# Patient Record
Sex: Female | Born: 1935
Health system: Southern US, Community
[De-identification: ages and names within clinical notes are randomized; demographics above are authoritative.]

## PROBLEM LIST (undated history)

## (undated) DIAGNOSIS — M549 Dorsalgia, unspecified: Secondary | ICD-10-CM

## (undated) DIAGNOSIS — Z5189 Encounter for other specified aftercare: Secondary | ICD-10-CM

## (undated) DIAGNOSIS — I359 Nonrheumatic aortic valve disorder, unspecified: Secondary | ICD-10-CM

## (undated) DIAGNOSIS — Z8711 Personal history of peptic ulcer disease: Secondary | ICD-10-CM

## (undated) DIAGNOSIS — Z8719 Personal history of other diseases of the digestive system: Secondary | ICD-10-CM

## (undated) DIAGNOSIS — I10 Essential (primary) hypertension: Secondary | ICD-10-CM

## (undated) DIAGNOSIS — K219 Gastro-esophageal reflux disease without esophagitis: Secondary | ICD-10-CM

## (undated) DIAGNOSIS — C44601 Unspecified malignant neoplasm of skin of unspecified upper limb, including shoulder: Secondary | ICD-10-CM

## (undated) DIAGNOSIS — K5790 Diverticulosis of intestine, part unspecified, without perforation or abscess without bleeding: Secondary | ICD-10-CM

## (undated) DIAGNOSIS — M1990 Unspecified osteoarthritis, unspecified site: Secondary | ICD-10-CM

## (undated) DIAGNOSIS — J189 Pneumonia, unspecified organism: Secondary | ICD-10-CM

## (undated) DIAGNOSIS — I119 Hypertensive heart disease without heart failure: Secondary | ICD-10-CM

## (undated) DIAGNOSIS — Z9889 Other specified postprocedural states: Secondary | ICD-10-CM

## (undated) DIAGNOSIS — M75101 Unspecified rotator cuff tear or rupture of right shoulder, not specified as traumatic: Secondary | ICD-10-CM

## (undated) DIAGNOSIS — E559 Vitamin D deficiency, unspecified: Secondary | ICD-10-CM

## (undated) DIAGNOSIS — G8929 Other chronic pain: Secondary | ICD-10-CM

## (undated) DIAGNOSIS — I2510 Atherosclerotic heart disease of native coronary artery without angina pectoris: Secondary | ICD-10-CM

## (undated) DIAGNOSIS — Z950 Presence of cardiac pacemaker: Secondary | ICD-10-CM

## (undated) DIAGNOSIS — M12811 Other specific arthropathies, not elsewhere classified, right shoulder: Secondary | ICD-10-CM

## (undated) DIAGNOSIS — E785 Hyperlipidemia, unspecified: Secondary | ICD-10-CM

## (undated) DIAGNOSIS — D649 Anemia, unspecified: Secondary | ICD-10-CM

## (undated) DIAGNOSIS — I4891 Unspecified atrial fibrillation: Secondary | ICD-10-CM

## (undated) DIAGNOSIS — IMO0001 Reserved for inherently not codable concepts without codable children: Secondary | ICD-10-CM

## (undated) HISTORY — DX: Vitamin D deficiency, unspecified: E55.9

## (undated) HISTORY — PX: ESOPHAGOGASTRODUODENOSCOPY: SHX1529

## (undated) HISTORY — PX: KNEE ARTHROPLASTY: SHX992

## (undated) HISTORY — DX: Unspecified osteoarthritis, unspecified site: M19.90

## (undated) HISTORY — PX: APPENDECTOMY: SHX54

## (undated) HISTORY — DX: Gastro-esophageal reflux disease without esophagitis: K21.9

## (undated) HISTORY — DX: Hypertensive heart disease without heart failure: I11.9

## (undated) HISTORY — PX: EYE SURGERY: SHX253

## (undated) HISTORY — PX: SHOULDER OPEN ROTATOR CUFF REPAIR: SHX2407

## (undated) HISTORY — PX: CRANIOTOMY: SHX93

## (undated) HISTORY — PX: CHOLECYSTECTOMY: SHX55

## (undated) HISTORY — PX: INSERT / REPLACE / REMOVE PACEMAKER: SUR710

## (undated) HISTORY — PX: COLONOSCOPY: SHX174

## (undated) HISTORY — DX: Diverticulosis of intestine, part unspecified, without perforation or abscess without bleeding: K57.90

## (undated) HISTORY — DX: Hyperlipidemia, unspecified: E78.5

## (undated) HISTORY — PX: BACK SURGERY: SHX140

## (undated) HISTORY — DX: Nonrheumatic aortic valve disorder, unspecified: I35.9

## (undated) HISTORY — PX: ABDOMINAL HYSTERECTOMY: SHX81

## (undated) HISTORY — PX: FRACTURE SURGERY: SHX138

---

## 1998-06-11 ENCOUNTER — Other Ambulatory Visit: Admission: RE | Admit: 1998-06-11 | Discharge: 1998-06-11 | Payer: Self-pay | Admitting: Obstetrics and Gynecology

## 2000-07-13 ENCOUNTER — Emergency Department (HOSPITAL_COMMUNITY): Admission: EM | Admit: 2000-07-13 | Discharge: 2000-07-14 | Payer: Self-pay | Admitting: Emergency Medicine

## 2000-07-16 ENCOUNTER — Ambulatory Visit (HOSPITAL_COMMUNITY): Admission: RE | Admit: 2000-07-16 | Discharge: 2000-07-16 | Payer: Self-pay

## 2000-08-21 ENCOUNTER — Ambulatory Visit (HOSPITAL_BASED_OUTPATIENT_CLINIC_OR_DEPARTMENT_OTHER): Admission: RE | Admit: 2000-08-21 | Discharge: 2000-08-22 | Payer: Self-pay | Admitting: Orthopedic Surgery

## 2007-08-30 ENCOUNTER — Encounter: Admission: RE | Admit: 2007-08-30 | Discharge: 2007-08-30 | Payer: Self-pay | Admitting: Neurosurgery

## 2007-12-02 ENCOUNTER — Inpatient Hospital Stay (HOSPITAL_COMMUNITY): Admission: RE | Admit: 2007-12-02 | Discharge: 2007-12-06 | Payer: Self-pay | Admitting: Neurosurgery

## 2008-03-24 ENCOUNTER — Encounter: Admission: RE | Admit: 2008-03-24 | Discharge: 2008-03-24 | Payer: Self-pay | Admitting: Neurosurgery

## 2008-05-28 ENCOUNTER — Encounter: Admission: RE | Admit: 2008-05-28 | Discharge: 2008-05-28 | Payer: Self-pay | Admitting: Neurosurgery

## 2008-07-09 ENCOUNTER — Encounter: Admission: RE | Admit: 2008-07-09 | Discharge: 2008-07-09 | Payer: Self-pay | Admitting: Neurosurgery

## 2008-11-24 ENCOUNTER — Encounter: Admission: RE | Admit: 2008-11-24 | Discharge: 2008-11-24 | Payer: Self-pay | Admitting: Neurosurgery

## 2008-12-09 ENCOUNTER — Inpatient Hospital Stay (HOSPITAL_COMMUNITY): Admission: RE | Admit: 2008-12-09 | Discharge: 2008-12-12 | Payer: Self-pay | Admitting: Neurosurgery

## 2009-04-13 ENCOUNTER — Encounter: Admission: RE | Admit: 2009-04-13 | Discharge: 2009-04-13 | Payer: Self-pay | Admitting: Neurosurgery

## 2009-09-07 ENCOUNTER — Encounter: Admission: RE | Admit: 2009-09-07 | Discharge: 2009-09-07 | Payer: Self-pay | Admitting: Neurosurgery

## 2009-12-06 ENCOUNTER — Encounter: Admission: RE | Admit: 2009-12-06 | Discharge: 2009-12-06 | Payer: Self-pay | Admitting: Neurosurgery

## 2010-04-10 ENCOUNTER — Encounter: Payer: Self-pay | Admitting: Neurosurgery

## 2010-04-11 ENCOUNTER — Encounter: Payer: Self-pay | Admitting: Neurosurgery

## 2010-04-23 ENCOUNTER — Emergency Department (HOSPITAL_COMMUNITY)
Admission: EM | Admit: 2010-04-23 | Discharge: 2010-04-23 | Disposition: A | Discharge status: Home / Self Care | Payer: Medicare Other | Attending: Emergency Medicine | Admitting: Emergency Medicine

## 2010-04-23 ENCOUNTER — Emergency Department (HOSPITAL_COMMUNITY): Payer: Medicare Other

## 2010-04-23 DIAGNOSIS — M549 Dorsalgia, unspecified: Secondary | ICD-10-CM | POA: Insufficient documentation

## 2010-04-23 DIAGNOSIS — I10 Essential (primary) hypertension: Secondary | ICD-10-CM | POA: Insufficient documentation

## 2010-04-26 ENCOUNTER — Other Ambulatory Visit: Payer: Self-pay | Admitting: Neurosurgery

## 2010-04-26 DIAGNOSIS — M545 Low back pain: Secondary | ICD-10-CM

## 2010-04-27 ENCOUNTER — Emergency Department (HOSPITAL_COMMUNITY): Payer: Medicare Other

## 2010-04-27 ENCOUNTER — Observation Stay (HOSPITAL_COMMUNITY)
Admission: EM | Admit: 2010-04-27 | Discharge: 2010-05-03 | Disposition: A | Discharge status: Home Health | Payer: Medicare Other | Source: Ambulatory Visit | Attending: Internal Medicine | Admitting: Internal Medicine

## 2010-04-27 DIAGNOSIS — M810 Age-related osteoporosis without current pathological fracture: Secondary | ICD-10-CM | POA: Insufficient documentation

## 2010-04-27 DIAGNOSIS — I4891 Unspecified atrial fibrillation: Secondary | ICD-10-CM | POA: Insufficient documentation

## 2010-04-27 DIAGNOSIS — I10 Essential (primary) hypertension: Secondary | ICD-10-CM | POA: Insufficient documentation

## 2010-04-27 DIAGNOSIS — R209 Unspecified disturbances of skin sensation: Secondary | ICD-10-CM | POA: Insufficient documentation

## 2010-04-27 DIAGNOSIS — Z01812 Encounter for preprocedural laboratory examination: Secondary | ICD-10-CM | POA: Insufficient documentation

## 2010-04-27 DIAGNOSIS — A498 Other bacterial infections of unspecified site: Secondary | ICD-10-CM | POA: Insufficient documentation

## 2010-04-27 DIAGNOSIS — M8448XA Pathological fracture, other site, initial encounter for fracture: Principal | ICD-10-CM | POA: Insufficient documentation

## 2010-04-27 DIAGNOSIS — IMO0002 Reserved for concepts with insufficient information to code with codable children: Secondary | ICD-10-CM | POA: Insufficient documentation

## 2010-04-27 DIAGNOSIS — N390 Urinary tract infection, site not specified: Secondary | ICD-10-CM | POA: Insufficient documentation

## 2010-04-27 DIAGNOSIS — M5430 Sciatica, unspecified side: Secondary | ICD-10-CM | POA: Insufficient documentation

## 2010-04-27 LAB — CBC
Hemoglobin: 10.9 g/dL — ABNORMAL LOW (ref 12.0–15.0)
MCH: 31.2 pg (ref 26.0–34.0)
MCHC: 33.6 g/dL (ref 30.0–36.0)
Platelets: 424 10*3/uL — ABNORMAL HIGH (ref 150–400)
RDW: 13.9 % (ref 11.5–15.5)

## 2010-04-27 LAB — DIFFERENTIAL
Basophils Absolute: 0 10*3/uL (ref 0.0–0.1)
Basophils Relative: 0 % (ref 0–1)
Eosinophils Absolute: 0 10*3/uL (ref 0.0–0.7)
Eosinophils Relative: 0 % (ref 0–5)
Monocytes Absolute: 0.4 10*3/uL (ref 0.1–1.0)
Monocytes Relative: 4 % (ref 3–12)

## 2010-04-27 LAB — URINALYSIS, ROUTINE W REFLEX MICROSCOPIC
Bilirubin Urine: NEGATIVE
Ketones, ur: NEGATIVE mg/dL
Specific Gravity, Urine: 1.012 (ref 1.005–1.030)
Urobilinogen, UA: 0.2 mg/dL (ref 0.0–1.0)

## 2010-04-27 LAB — POCT I-STAT, CHEM 8
BUN: 6 mg/dL (ref 6–23)
Calcium, Ion: 1.01 mmol/L — ABNORMAL LOW (ref 1.12–1.32)
Creatinine, Ser: 0.7 mg/dL (ref 0.4–1.2)
Glucose, Bld: 148 mg/dL — ABNORMAL HIGH (ref 70–99)
Hemoglobin: 11.6 g/dL — ABNORMAL LOW (ref 12.0–15.0)
TCO2: 27 mmol/L (ref 0–100)

## 2010-04-27 LAB — URINE MICROSCOPIC-ADD ON

## 2010-04-27 LAB — PROTIME-INR: Prothrombin Time: 13.8 seconds (ref 11.6–15.2)

## 2010-04-27 MED ORDER — GADOBENATE DIMEGLUMINE 529 MG/ML IV SOLN
18.0000 mL | Freq: Once | INTRAVENOUS | Status: AC
  Administered 2010-04-27: 18 mL via INTRAVENOUS

## 2010-04-28 LAB — BASIC METABOLIC PANEL
Calcium: 8.9 mg/dL (ref 8.4–10.5)
Creatinine, Ser: 0.64 mg/dL (ref 0.4–1.2)
GFR calc Af Amer: >60 mL/min (ref >60)
GFR calc non Af Amer: >60 mL/min (ref >60)
Glucose, Bld: 172 mg/dL — ABNORMAL HIGH (ref 70–99)
Sodium: 134 mEq/L — ABNORMAL LOW (ref 135–145)

## 2010-04-28 LAB — CBC
HCT: 35 % — ABNORMAL LOW (ref 36.0–46.0)
MCV: 94.1 fL (ref 78.0–100.0)
RBC: 3.72 MIL/uL — ABNORMAL LOW (ref 3.87–5.11)
RDW: 13.8 % (ref 11.5–15.5)
WBC: 10.7 10*3/uL — ABNORMAL HIGH (ref 4.0–10.5)

## 2010-04-29 ENCOUNTER — Other Ambulatory Visit: Payer: Medicare Other

## 2010-04-29 LAB — URINE CULTURE: Colony Count: >=100000

## 2010-05-01 LAB — BASIC METABOLIC PANEL
CO2: 28 mEq/L (ref 19–32)
Calcium: 8.8 mg/dL (ref 8.4–10.5)
Creatinine, Ser: 0.61 mg/dL (ref 0.4–1.2)
GFR calc Af Amer: >60 mL/min (ref >60)
Sodium: 134 mEq/L — ABNORMAL LOW (ref 135–145)

## 2010-05-01 LAB — CBC
Hemoglobin: 11.3 g/dL — ABNORMAL LOW (ref 12.0–15.0)
MCHC: 33.6 g/dL (ref 30.0–36.0)
Platelets: 538 10*3/uL — ABNORMAL HIGH (ref 150–400)
RBC: 3.68 MIL/uL — ABNORMAL LOW (ref 3.87–5.11)

## 2010-05-02 ENCOUNTER — Observation Stay (HOSPITAL_COMMUNITY): Payer: Medicare Other

## 2010-05-02 LAB — CBC
HCT: 33.9 % — ABNORMAL LOW (ref 36.0–46.0)
Hemoglobin: 11.2 g/dL — ABNORMAL LOW (ref 12.0–15.0)
MCHC: 33 g/dL (ref 30.0–36.0)
RBC: 3.66 MIL/uL — ABNORMAL LOW (ref 3.87–5.11)
WBC: 8.3 10*3/uL (ref 4.0–10.5)

## 2010-05-02 LAB — BASIC METABOLIC PANEL
CO2: 28 mEq/L (ref 19–32)
Chloride: 96 mEq/L (ref 96–112)
Creatinine, Ser: 0.61 mg/dL (ref 0.4–1.2)
GFR calc Af Amer: >60 mL/min (ref >60)
Potassium: 3.7 mEq/L (ref 3.5–5.1)
Sodium: 134 mEq/L — ABNORMAL LOW (ref 135–145)

## 2010-05-02 LAB — DIFFERENTIAL
Basophils Absolute: 0 10*3/uL (ref 0.0–0.1)
Basophils Relative: 0 % (ref 0–1)
Lymphocytes Relative: 21 % (ref 12–46)
Monocytes Absolute: 0.6 10*3/uL (ref 0.1–1.0)
Neutro Abs: 5.9 10*3/uL (ref 1.7–7.7)
Neutrophils Relative %: 71 % (ref 43–77)

## 2010-05-02 LAB — APTT: aPTT: 32 seconds (ref 24–37)

## 2010-05-02 MED ORDER — IOHEXOL 300 MG/ML  SOLN: 50.0000 mL | Freq: Once | INTRAMUSCULAR | Status: AC | PRN

## 2010-05-12 NOTE — Consult Note (Signed)
NAME:  [name], [name] NO.:  [id number]  MEDICAL RECORD NO.:  [id number]           PATIENT TYPE:  I  LOCATION:  5035                         FACILITY:  [facility]  PHYSICIAN:  Donalee Citrin, M.D.        DATE OF BIRTH:  1931-04-02  DATE OF CONSULTATION:  04/27/2010 DATE OF DISCHARGE:                                CONSULTATION   REASON FOR CONSULTATION:  Low back pain and sacral insufficiency fractures.  HISTORY OF PRESENT ILLNESS:  The patient is a 77 year old female with past medical history of longstanding chronic low back pain and hypertension who presented to my office yesterday with an exacerbation of low back pain and lack of response to pain medication.  On evaluation in the office, she was neurologically intact.  I changed her pain medication and ordered an outpatient MRI, but she showed up in the ER today, underwent MRI scan.  MRI scan showed no acute changes in her lumbar spine and no complications from her previous operations, however, did show extensive right-sided sacral insufficiency fractures, as well as apparently her blood work and medical workup here in the ER showed a urinary tract infection.  The patient currently is complaining of pain predominantly across her low back into her tailbone on both sides that makes it difficult.  She has no pain in her legs.  Her bowel and bladder are working fine except for some pain on urination.  Past medical history again is remarkable for hypertension and multiple back surgeries.  On exam, the patient has 5/5 strength in iliopsoas, quads, hamstrings, gastrocs, and EHL.  She has normal symmetric reflexes and sensation.  She has localized pain around her sacrum on both sides, and her MRI scan shows well decompressed spinal canal with no complications of lumbar fusion.  She was fused down from L1-L5.  She has an intact disk space at L5-S1, however, in the sacrum, it shows some increased enhancement on the right side  of her sacrum which is consistent with sacral insufficiency fractures, so the patient most likely will be admitted to Medicine, will need extensive workup metabolically and nutritionally with potential vitamin D and calcium supplements per evaluation.  She may also need workup for osteopenia.  In addition, the patient will need bedrest with limited out of bed activity and use of coccygeal pillow whenever she is out of bed.  The patient will need DVT prophylaxis with Lovenox subcu b.i.d., and after adequate treatment of her UTI, the patient should be able to be discharged either home or to a rehab facility.  Pain management wise, the patient has been on long-term oxycodone and she has developed tolerance to this.  She may actually need some augmentation with fentanyl patch or increasing her oxycodone dose, she was on 10 mg p.o. q.6 h. one to two pills p.r.n., so it is possible she may have to go to 15 with the OxyIR, but I will follow the patient's consult at this hospital and we will continue to work on pain management.          ______________________________ Donalee Citrin, M.D.  GC/MEDQ  D:  04/27/2010  T:  04/28/2010  Job:  [id number]  Electronically Signed by Donalee Citrin M.D. on 05/12/2010 07:06:54 AM

## 2010-05-13 ENCOUNTER — Other Ambulatory Visit (HOSPITAL_COMMUNITY): Payer: Self-pay | Admitting: Interventional Radiology

## 2010-05-13 DIAGNOSIS — IMO0002 Reserved for concepts with insufficient information to code with codable children: Secondary | ICD-10-CM

## 2010-05-18 ENCOUNTER — Ambulatory Visit (HOSPITAL_COMMUNITY)
Admission: RE | Admit: 2010-05-18 | Discharge: 2010-05-18 | Disposition: A | Discharge status: Home / Self Care | Payer: Medicare Other | Source: Ambulatory Visit | Attending: Interventional Radiology | Admitting: Interventional Radiology

## 2010-05-18 ENCOUNTER — Other Ambulatory Visit (HOSPITAL_COMMUNITY): Payer: Self-pay | Admitting: Interventional Radiology

## 2010-05-18 DIAGNOSIS — IMO0002 Reserved for concepts with insufficient information to code with codable children: Secondary | ICD-10-CM

## 2010-05-23 NOTE — H&P (Signed)
NAME:  [name], [name] NO.:  [id number]  MEDICAL RECORD NO.:  [id number]           PATIENT TYPE:  I  LOCATION:  5035                         FACILITY:  [facility]  PHYSICIAN:  Della Goo, M.D. DATE OF BIRTH:  1931-09-12  DATE OF ADMISSION:  04/27/2010 DATE OF DISCHARGE:                             HISTORY & PHYSICAL   CHIEF COMPLAINT:  Severe back pain.  HISTORY OF PRESENT ILLNESS:  This is a 77 year old female who presents to the emergency department secondary to complaints of severe lower back pain radiating into both legs, which has been worsening over the past 3 weeks.  The patient denies any history of trauma.  She denies having any overexertion.  The patient was seen and evaluated in the emergency department on April 23, 2010, and discharged on pain medication therapy and was to follow up with Dr. Donalee Citrin who has performed her previous back surgeries.  She saw Dr. Wynetta Emery 1 day ago.  She presented to the emergency department on April 27, 2010, secondary to continued pain without relief from the prescribed medication.  The patient was seen and evaluated by the EDP.  Dr. Wynetta Emery also saw the patient in the emergency department and the patient was referred for medical admission for pain control..  At the time of evaluation the patient had been medicated for pain.  She stated the pain had been 10/10, sharp and achy, radiating into both legs.  She states that she had difficulty walking over the past 24 hours secondary to pain.  She states that now the pain is 0/10 after being medicated.  But, she is unable to walk and has weakness.  The imaging studies performed today in the emergency department were the MRI of the lumbar spine with and without contrast.  The patient was found to have extensive surgical changes in the lumbar spine which appears stable, degenerative disk disease at T11 through T12 and T12 through L1, and a right sacral stress fracture was  also seen.  The patient was referred for medical admission.  PAST MEDICAL HISTORY:  History of hypertension, history of atrial fibrillation, osteoarthritis, osteoporosis.  Her medications will need to be further verified.  The patient's medications include flecainide 50 mg one p.o. daily, metoprolol 12.5 mg one p.o. b.i.d., Tribenzor 25 once daily, aspirin 81 mg one p.o. daily, Percocet 10/325 p.r.n., oxycodone 15 mg p.o. q.6 h. p.r.n., alprazolam 0.25 mg p.o. p.r.n., Lyrica 75 mg one p.o. t.i.d., and cyclobenzaprine 10 mg p.o. p.r.n.  ALLERGIES:  To AMLODIPINE and PENICILLIN.  SOCIAL HISTORY:  The patient is a nonsmoker, nondrinker.  No history of illicit drug usage.  FAMILY HISTORY:  Noncontributory.  REVIEW OF SYSTEMS:  Pertinent as mentioned above.  The patient denies any urinary incontinence or loss of bowel or bladder function.  PHYSICAL EXAMINATION FINDINGS:  GENERAL:  This is a pleasant 77 year old elderly Caucasian female in no visible discomfort or acute distress currently. VITAL SIGNS:  Temperature 98.7, blood pressure 153/63, heart rate 84, respirations 18, O2 saturations 96-97%. HEENT:  Normocephalic, atraumatic.  Pupils equally round, reactive to light.  Extraocular movements are intact.  Funduscopic  benign.  There is no scleral icterus.  Nares are patent bilaterally.  Oropharynx is clear. NECK:  Supple.  Full range of motion.  No thyromegaly, adenopathy or jugular venous distention. CARDIOVASCULAR:  Regular rate and rhythm.  No murmurs, gallops or rubs. LUNGS:  Clear to auscultation bilaterally.  No rales, rhonchi or wheezes. ABDOMEN:  Positive bowel sounds, soft, nontender, nondistended.  No hepatosplenomegaly.  No rebound.  No guarding. EXTREMITIES:  Without cyanosis, clubbing or edema. NEUROLOGIC:  The patient is alert and oriented x3.  Her cranial nerves are intact.  She is able to move all four of her extremities, however, does have limited mobility of the  lower extremity secondary to pain. Gait was not assessed secondary to her pain.  ASSESSMENT:  [age]-year-old female being admitted with 1. Stress fracture of the sacrum/compression fracture. 2. Intractable back pain with sciatica. 3. Degenerative disk disease. 4. Atrial fibrillation. 5. Urinary tract infection.  PLAN:  The patient will be admitted for pain control therapy.  Her regular medications will be further verified.  The patient will be placed on antibiotic therapy of ciprofloxacin one p.o. b.i.d. for the urinary tract infection.  A urine culture and sensitivity has been requested.  The patient will be placed on DVT prophylaxis.  Further workup will ensue pending results of the patient's clinical course.     Della Goo, M.D.     HJ/MEDQ  D:  04/27/2010  T:  04/27/2010  Job:  [id number]  Electronically Signed by Della Goo M.D. on 05/23/2010 08:13:03 PM

## 2010-05-26 NOTE — Discharge Summary (Addendum)
NAME:  [name], [name]              ACCOUNT NO.:  [id number]  MEDICAL RECORD NO.:  [id number]           PATIENT TYPE:  I  LOCATION:  5035                         FACILITY:  [facility]  PHYSICIAN:  Kela Millin, M.D.DATE OF BIRTH:  06/22/1931  DATE OF ADMISSION:  04/27/2010 DATE OF DISCHARGE:  05/03/2010                        DISCHARGE SUMMARY - REFERRING   DISCHARGE DIAGNOSES: 1. Right sacral stress/insufficiency fractures. 2. E coli urinary tract infection. 3. History of atrial fibrillation. 4. Osteoarthritis. 5. Osteopenia. 6. Osteoporosis. 7. Hypertension.  CONSULTATIONS: 1. Neurosurgery -- Donalee Citrin, M.D. 2. Interventional radiology -- Dr. Corliss Skains.  PROCEDURES AND STUDIES: 1. MRI of lumbar spine on April 27, 2010 -- extensive surgical     changes in the lumbar spine.  This appears stable.  No complicating     features.  Stable degenerative disk disease at T11-T12 and T12-L1.     Right sacral stress/insufficiency fractures.  BRIEF HISTORY:  The patient is a 77 year old white female with above- listed medical problems who presented with complaints of severe low back pain radiating to both legs and had been worsening over a 3-week period. She denied any trauma and also denied any weightlifting/over exertion. The patient had been seen in the ED on February 4th and was and was discharged on pain medications and scheduled to follow up with Dr. Donalee Citrin who had done previous back surgeries.  She saw Dr. Wynetta Emery on the day prior to admission and came back the next day with continued pain without relief from her prescribed medications.  Dr. Wynetta Emery saw the patient in the ED and requested admission to the Medicine Service.  She described her pain at 10/10 in intensity radiating to both legs; and she was having difficulty walking in the 24 hours prior to admission secondary to the pain.  In the ED, she had an MRI done and the results are as stated above.  She was admitted for  further evaluation and management.  HOSPITAL COURSE: 1. Right sacral stress/insufficiency fractures -- upon admission, the     patient had an MRI done and the results are as stated above.  She     was started on IV as well as oral analgesics for pain control.  Dr.     Donalee Citrin saw the patient and followed up and recommended     Interventional Radiology consult for vertebroplasty/sacroplasty.     On the above analgesics, the patient was still complaining of pain.     So, she was started on a fentanyl patch and with that her pain     control improved.  Interventional Radiology did sacroplasty on     May 02, 2010.  The patient had been able to ambulate with a     walker.  Home health PT/OT is to follow the patient upon discharge.     Dr. Wynetta Emery saw the patient today and has recommended that she be     discharged to home and follow up with him in 1-2 weeks. 2. E coli urinary tract infection -- the patient's urinalysis on     admission was consistent with urinary tract infection.  She was  started on a empiric antibiotics.  The urine cultures came back     growing E coli that was resistant to Cipro.  The Cipro was     discontinued and she was placed on Rocephin instead.  She has     remained afebrile and her white cell count today prior to discharge     is 8.3.  She will be discharged on oral antibiotics to complete the     treatment course and is to follow up as an outpatient with her     primary care physician. 3. Atrial fibrillation -- she was maintained on her outpatient     medications as well as aspirin during this hospital stay and is to     continue them upon discharge.  Her rate remained stable during her     hospitalization. 4. Osteoporosis -- the patient was discharged on Os-Cal D with and is     to follow up with her primary care physician. 5. Hypertension -- she was maintained on outpatient medications and is     to continue them upon discharge.  DISCHARGE  MEDICATIONS: 1. Aspirin 81 mg p.o. daily. 2. Ceftin 500 mg 1 p.o. b.i.d. for 3 more days. 3. Colace 100 mg p.o. b.i.d. 4. Fentanyl 25 mcg every 72 hours. 5. Os-Cal plus D 1 tablet p.o. t.i.d. 6. Senokot 2 tablets p.o. daily p.r.n. 7. Flecainide 100 mg 1/2 a tablet in the morning and 1 tablet in the     evening. 8. Flexeril 10 mg 1 p.o. t.i.d. p.r.n. 9. Metoprolol 12.5 mg 1/2 a tablet p.o. b.i.d. 10.Percocet 10/325 mg 1-2 tablets every 4-6 hours p.r.n. 11.Tribenzor 40/10/25 mg 1 tablet p.o. daily.  DISCONTINUED MEDICATION:  Oxycodone 15 mg.  FOLLOWUP CARE: 1. Dr. Donalee Citrin in 1-2 weeks. 2. Primary care physician in 1-2 weeks.  DISCHARGE CONDITION:  Improved/stable.     Kela Millin, M.D.     ACV/MEDQ  D:  05/03/2010  T:  05/03/2010  Job:  [id number]  cc:   Donalee Citrin, M.D.  Electronically Signed by Donnalee Curry M.D. on 05/24/2010 06:09:19 PM Electronically Signed by Donnalee Curry M.D. on 05/24/2010 06:19:52 PM Electronically Signed by Donnalee Curry M.D. on 05/24/2010 07:32:31 PM

## 2010-06-22 ENCOUNTER — Other Ambulatory Visit (HOSPITAL_COMMUNITY): Payer: Self-pay | Admitting: Neurosurgery

## 2010-06-22 DIAGNOSIS — B999 Unspecified infectious disease: Secondary | ICD-10-CM

## 2010-06-24 LAB — BASIC METABOLIC PANEL
BUN: 10 mg/dL (ref 6–23)
BUN: 11 mg/dL (ref 6–23)
CO2: 26 mEq/L (ref 19–32)
CO2: 31 mEq/L (ref 19–32)
Calcium: 8.5 mg/dL (ref 8.4–10.5)
Calcium: 9.1 mg/dL (ref 8.4–10.5)
Chloride: 104 mEq/L (ref 96–112)
Chloride: 104 mEq/L (ref 96–112)
Creatinine, Ser: 0.63 mg/dL (ref 0.4–1.2)
Creatinine, Ser: 0.64 mg/dL (ref 0.4–1.2)
GFR calc Af Amer: >60 mL/min (ref >60)
GFR calc Af Amer: >60 mL/min (ref >60)
GFR calc non Af Amer: >60 mL/min (ref >60)
GFR calc non Af Amer: >60 mL/min (ref >60)
Glucose, Bld: 104 mg/dL — ABNORMAL HIGH (ref 70–99)
Glucose, Bld: 115 mg/dL — ABNORMAL HIGH (ref 70–99)
Potassium: 3.9 mEq/L (ref 3.5–5.1)
Potassium: 4.8 mEq/L (ref 3.5–5.1)
Sodium: 138 mEq/L (ref 135–145)
Sodium: 142 mEq/L (ref 135–145)

## 2010-06-24 LAB — CBC
HCT: 33.8 % — ABNORMAL LOW (ref 36.0–46.0)
Hemoglobin: 11.2 g/dL — ABNORMAL LOW (ref 12.0–15.0)
MCHC: 33.3 g/dL (ref 30.0–36.0)
MCV: 91.2 fL (ref 78.0–100.0)
Platelets: 344 10*3/uL (ref 150–400)
RBC: 3.71 MIL/uL — ABNORMAL LOW (ref 3.87–5.11)
RDW: 18 % — ABNORMAL HIGH (ref 11.5–15.5)
WBC: 6.3 10*3/uL (ref 4.0–10.5)

## 2010-06-24 LAB — TYPE AND SCREEN
ABO/RH(D): A POS
Antibody Screen: NEGATIVE

## 2010-06-28 ENCOUNTER — Ambulatory Visit (HOSPITAL_COMMUNITY)
Admission: RE | Admit: 2010-06-28 | Discharge: 2010-06-28 | Disposition: A | Discharge status: Home / Self Care | Payer: Medicare Other | Source: Ambulatory Visit | Attending: Neurosurgery | Admitting: Neurosurgery

## 2010-06-28 ENCOUNTER — Other Ambulatory Visit (HOSPITAL_COMMUNITY): Payer: Self-pay | Admitting: Neurosurgery

## 2010-06-28 DIAGNOSIS — B999 Unspecified infectious disease: Secondary | ICD-10-CM

## 2010-06-28 DIAGNOSIS — M545 Low back pain, unspecified: Secondary | ICD-10-CM | POA: Insufficient documentation

## 2010-06-28 DIAGNOSIS — M899 Disorder of bone, unspecified: Secondary | ICD-10-CM | POA: Insufficient documentation

## 2010-06-28 DIAGNOSIS — M1990 Unspecified osteoarthritis, unspecified site: Secondary | ICD-10-CM | POA: Insufficient documentation

## 2010-06-28 DIAGNOSIS — I4891 Unspecified atrial fibrillation: Secondary | ICD-10-CM | POA: Insufficient documentation

## 2010-06-28 DIAGNOSIS — G8929 Other chronic pain: Secondary | ICD-10-CM | POA: Insufficient documentation

## 2010-06-28 DIAGNOSIS — I10 Essential (primary) hypertension: Secondary | ICD-10-CM | POA: Insufficient documentation

## 2010-06-28 DIAGNOSIS — M799 Soft tissue disorder, unspecified: Secondary | ICD-10-CM | POA: Insufficient documentation

## 2010-06-28 LAB — POCT I-STAT, CHEM 8
BUN: 13 mg/dL (ref 6–23)
Chloride: 101 mEq/L (ref 96–112)
Creatinine, Ser: 0.8 mg/dL (ref 0.4–1.2)
Sodium: 138 mEq/L (ref 135–145)

## 2010-06-28 LAB — CBC
HCT: 31.9 % — ABNORMAL LOW (ref 36.0–46.0)
MCH: 29.6 pg (ref 26.0–34.0)
MCV: 93.5 fL (ref 78.0–100.0)
RBC: 3.41 MIL/uL — ABNORMAL LOW (ref 3.87–5.11)
WBC: 8.8 10*3/uL (ref 4.0–10.5)

## 2010-06-28 LAB — APTT: aPTT: 33 s (ref 24–37)

## 2010-07-01 LAB — BODY FLUID CULTURE: Culture: NO GROWTH

## 2010-07-05 ENCOUNTER — Ambulatory Visit (HOSPITAL_COMMUNITY)
Admission: RE | Admit: 2010-07-05 | Discharge: 2010-07-05 | Disposition: A | Discharge status: Home / Self Care | Payer: Medicare Other | Source: Ambulatory Visit | Attending: Neurosurgery | Admitting: Neurosurgery

## 2010-07-05 ENCOUNTER — Other Ambulatory Visit (HOSPITAL_COMMUNITY): Payer: Self-pay | Admitting: Neurosurgery

## 2010-07-05 DIAGNOSIS — Z95828 Presence of other vascular implants and grafts: Secondary | ICD-10-CM

## 2010-07-05 DIAGNOSIS — Z452 Encounter for adjustment and management of vascular access device: Secondary | ICD-10-CM | POA: Insufficient documentation

## 2010-07-12 ENCOUNTER — Emergency Department (HOSPITAL_COMMUNITY): Payer: Medicare Other

## 2010-07-12 ENCOUNTER — Inpatient Hospital Stay (HOSPITAL_COMMUNITY)
Admission: EM | Admit: 2010-07-12 | Discharge: 2010-07-27 | DRG: 478 | Disposition: A | Discharge status: Home Health | Payer: Medicare Other | Attending: Neurosurgery | Admitting: Neurosurgery

## 2010-07-12 DIAGNOSIS — Z880 Allergy status to penicillin: Secondary | ICD-10-CM

## 2010-07-12 DIAGNOSIS — M810 Age-related osteoporosis without current pathological fracture: Secondary | ICD-10-CM | POA: Diagnosis present

## 2010-07-12 DIAGNOSIS — Z79899 Other long term (current) drug therapy: Secondary | ICD-10-CM

## 2010-07-12 DIAGNOSIS — M869 Osteomyelitis, unspecified: Secondary | ICD-10-CM | POA: Diagnosis present

## 2010-07-12 DIAGNOSIS — I10 Essential (primary) hypertension: Secondary | ICD-10-CM | POA: Diagnosis present

## 2010-07-12 DIAGNOSIS — IMO0002 Reserved for concepts with insufficient information to code with codable children: Principal | ICD-10-CM | POA: Diagnosis present

## 2010-07-12 DIAGNOSIS — I4891 Unspecified atrial fibrillation: Secondary | ICD-10-CM | POA: Diagnosis present

## 2010-07-12 DIAGNOSIS — Z981 Arthrodesis status: Secondary | ICD-10-CM

## 2010-07-12 DIAGNOSIS — M8448XA Pathological fracture, other site, initial encounter for fracture: Secondary | ICD-10-CM | POA: Diagnosis present

## 2010-07-12 DIAGNOSIS — M519 Unspecified thoracic, thoracolumbar and lumbosacral intervertebral disc disorder: Secondary | ICD-10-CM | POA: Diagnosis present

## 2010-07-12 DIAGNOSIS — Z7982 Long term (current) use of aspirin: Secondary | ICD-10-CM

## 2010-07-12 LAB — BASIC METABOLIC PANEL
Calcium: 8.8 mg/dL (ref 8.4–10.5)
Creatinine, Ser: 0.62 mg/dL (ref 0.4–1.2)
GFR calc Af Amer: >60 mL/min (ref >60)
GFR calc non Af Amer: >60 mL/min (ref >60)

## 2010-07-12 LAB — URINALYSIS, ROUTINE W REFLEX MICROSCOPIC
Ketones, ur: NEGATIVE mg/dL
Protein, ur: NEGATIVE mg/dL
Urobilinogen, UA: 0.2 mg/dL (ref 0.0–1.0)

## 2010-07-12 LAB — CBC
MCH: 29.9 pg (ref 26.0–34.0)
MCHC: 32.8 g/dL (ref 30.0–36.0)
Platelets: 512 10*3/uL — ABNORMAL HIGH (ref 150–400)
RBC: 3.61 MIL/uL — ABNORMAL LOW (ref 3.87–5.11)
RDW: 14.8 % (ref 11.5–15.5)

## 2010-07-12 LAB — DIFFERENTIAL
Basophils Relative: 0 % (ref 0–1)
Eosinophils Absolute: 0.1 10*3/uL (ref 0.0–0.7)
Eosinophils Relative: 1 % (ref 0–5)
Monocytes Absolute: 0.6 10*3/uL (ref 0.1–1.0)
Monocytes Relative: 7 % (ref 3–12)
Neutrophils Relative %: 74 % (ref 43–77)

## 2010-07-13 LAB — SEDIMENTATION RATE: Sed Rate: 91 mm/hr — ABNORMAL HIGH (ref 0–22)

## 2010-07-14 ENCOUNTER — Inpatient Hospital Stay (HOSPITAL_COMMUNITY): Payer: Medicare Other

## 2010-07-14 LAB — URINE CULTURE
Colony Count: NO GROWTH
Culture: NO GROWTH

## 2010-07-15 ENCOUNTER — Inpatient Hospital Stay (HOSPITAL_COMMUNITY): Payer: Medicare Other

## 2010-07-15 ENCOUNTER — Other Ambulatory Visit (HOSPITAL_COMMUNITY): Payer: Medicare Other

## 2010-07-15 DIAGNOSIS — M519 Unspecified thoracic, thoracolumbar and lumbosacral intervertebral disc disorder: Secondary | ICD-10-CM

## 2010-07-15 MED ORDER — GADOBENATE DIMEGLUMINE 529 MG/ML IV SOLN
20.0000 mL | Freq: Once | INTRAVENOUS | Status: AC
  Administered 2010-07-15: 20 mL via INTRAVENOUS

## 2010-07-17 LAB — BASIC METABOLIC PANEL
Calcium: 8.9 mg/dL (ref 8.4–10.5)
Creatinine, Ser: 0.73 mg/dL (ref 0.4–1.2)
GFR calc Af Amer: >60 mL/min (ref >60)
GFR calc non Af Amer: >60 mL/min (ref >60)
Glucose, Bld: 106 mg/dL — ABNORMAL HIGH (ref 70–99)
Sodium: 135 mEq/L (ref 135–145)

## 2010-07-18 NOTE — Consult Note (Signed)
NAME:  [name], [name] NO.:  [id number]  MEDICAL RECORD NO.:  [id number]           PATIENT TYPE:  I  LOCATION:  3009                         FACILITY:  [facility]  PHYSICIAN:  Acey Lav, MD  DATE OF BIRTH:  [birth year]  DATE OF CONSULTATION:  07/15/2010 DATE OF DISCHARGE:                                CONSULTATION   REQUESTING PHYSICIAN:  Donalee Citrin, MD  REASON FOR INFECTIOUS DISEASE CONSULTATION:  This patient with T11-T12, T12-L1 diskitis.  HISTORY OF PRESENT ILLNESS:  [name] is a 77 year old lady with past medical history significant for hypertension, osteoarthritis, sclerosis, who underwent prior lumbar laminectomies and fusions from L2-3, L3-4, L4- 5 in 2009, and then underwent re-exploration of the lumbar fusion in 2010 September with posterolateral fusion of L1-L5.  She continued to have back pain and underwent vertebroplasty of her fractures at her S1 site on May 02, 2010.  The patient then developed worsening back pain and underwent a CT scan as an outpatient, which showed increased signal at T11-12, T12-L1 disk spaces.  She underwent disk space aspirate via CT guidance on April 17.  This aspirate failed to grow any organisms.  Consideration was given to re-aspirate the disk space, but this was not done and so the patient was placed on empiric antibiotics with Rocephin and vancomycin.  On this therapy, the patient has continued to have severe low back pain and now CT scan done on the 26th shows evidence of extensive bony destructive changes involving T11-12 and L1 consistent with diskitis in these spaces as well as osteomyelitis involving T11, T12, and T1 with loosening of the screws in the L1 vertebral bodies and paraspinal soft tissue density with retropulsion of the bone in the inferior aspect of T12.  The patient was readmitted to Dr. Lonie Peak service and placed on vancomycin and gentamicin.  She denies having any fevers.  She is about  to have MRI to better delineate the nature of her diskitis and osteomyelitis.  We were consulted to assist in management of this patient with diskitis and vertebral osteomyelitis in the setting of hardware and also recent vertebroplasty.  PAST MEDICAL HISTORY: 1. Osteoporosis, osteoarthritis. 2. Hypertension. 3. Atrial fibrillation.  ALLERGIES:  The patient is allergic to PENICILLIN which caused hives, although she tolerated cephalosporins without difficulty; NORVASC and BENAZEPRIL had caused tongue swelling.  CURRENT MEDICATIONS: 1. Amlodipine. 2. Aspirin. 3. Vitamin D. 4. Flexeril. 5. Voltaren. 6. Flecainide. 7. Gentamicin. 8. Hydrochlorothiazide. 9. Benicar. 10.MiraLax. 11.Vancomycin. 12.Alprazolam.  REVIEW OF SYSTEMS:  As described in history present illness.  Also, pertinent for lower extremity spasms.  Otherwise, 12-point review of systems is negative.  PHYSICAL EXAMINATION:  VITAL SIGNS:  Temperature maximum in the last 24 hours is 99, temperature current 98.2, blood pressure 149/77, pulse 64, respirations 16, pulse ox 93% on room air.GENERAL:  Quite pleasant lady, in no acute stress. HEENT:  Normocephalic, atraumatic.  Pupils equal, round, reactive to light.  Sclerae icteric.  Oropharynx is clear. NECK:  Supple. CARDIOVASCULAR:  Regular rate and rhythm.  No murmurs, gallops, or rubs heard. LUNGS:  Clear to auscultation bilaterally. ABDOMEN:  Soft,  nondistended, nontender. EXTREMITIES:  Right knee prosthetic knee with clean, dry, and intact incision. NEUROLOGIC:  She is able to move her hips and knees, can flex and extend fully.  Otherwise, nonfocal neurologic exam.  LABORATORY DATA:  CT scan of the spine showed extensive surgical changes involving T11-12, L1, findings consistent with diskitis T11-12, T12-L1, osteomyelitis at T11-12 and L1, screws loose at the L1 vertebral bodies, and paraspinal soft tissue density with mild retropulsion of bone in inferior  aspect of T12.  C-reactive protein was 2.  Sedimentation rate was 91.  Urinalysis, although the patient does have symptoms, was negative.  CBC/differential:  White count 8, hemoglobin 10.8, platelets 512, ANC of 5.9.  Metabolic panel:  Sodium 133, potassium 3.9, chloride 101, bicarb 24, BUN and creatinine 11 and 1.60, glucose 124.  Microbiological data:  Urine culture on 24th, no growth.  Body fluid culture June 28, 2010, no growth.  Urine culture April 27, 2010, E- coli that was resistant to levofloxacin, but otherwise sensitive.  IMPRESSION AND RECOMMENDATIONS:  A 77 year old lady with progressive diskitis, osteomyelitis involving the thoracic and lumbar spine.  Vertebral osteomyelitis and diskitis:  I reviewed again the MRI to further delineate what is going on in her spine.  My preference would be if we could take her back off antibiotics and re-aspirate sites after several days her being off antibiotics to give [place] a better chance of isolating the organism.  I would then prepare for checking bacterial cultures, fungal cultures, and AFB cultures from this site.  If we fail to grow anything, I will broaden her out and put her on vancomycin andcefepime.  For the time being while we are getting more further data, I am okay with her staying on vancomycin.  I would like to stop her gentamicin as I am concerned about possibility of nephrotoxicity in this elderly lady with the gentamicin, but also with the vancomycin, though I would like to keep the latter on board.  Thank you for Infectious Disease consultation.  We will continue to follow along.     Acey Lav, MD     CV/MEDQ  D:  07/15/2010  T:  07/16/2010  Job:  [id number]  Electronically Signed by Paulette Blanch DAM MD on 07/18/2010 09:08:27 PM

## 2010-07-19 DIAGNOSIS — M519 Unspecified thoracic, thoracolumbar and lumbosacral intervertebral disc disorder: Secondary | ICD-10-CM

## 2010-07-20 ENCOUNTER — Inpatient Hospital Stay (HOSPITAL_COMMUNITY): Payer: Medicare Other

## 2010-07-20 LAB — PROTIME-INR: Prothrombin Time: 13.4 seconds (ref 11.6–15.2)

## 2010-07-20 LAB — CBC
MCH: 29.6 pg (ref 26.0–34.0)
MCV: 91.7 fL (ref 78.0–100.0)
Platelets: 408 10*3/uL — ABNORMAL HIGH (ref 150–400)
RBC: 3.51 MIL/uL — ABNORMAL LOW (ref 3.87–5.11)
RDW: 15.1 % (ref 11.5–15.5)
WBC: 5.9 10*3/uL (ref 4.0–10.5)

## 2010-07-20 LAB — C-REACTIVE PROTEIN: CRP: 1.3 mg/dL — ABNORMAL HIGH (ref <0.6)

## 2010-07-21 NOTE — H&P (Signed)
  NAME:  [name], [name] NO.:  [id number]  MEDICAL RECORD NO.:  [id number]           PATIENT TYPE:  I  LOCATION:  3009                         FACILITY:  [facility]  PHYSICIAN:  Donalee Citrin, M.D.        DATE OF BIRTH:  11-10-1931  DATE OF ADMISSION:  07/12/2010 DATE OF DISCHARGE:                             HISTORY & PHYSICAL   REASON FOR ADMISSION:  T11-12 and T12-L1 diskitis and pain control.  HISTORY OF PRESENT ILLNESS:  The patient is very pleasant 77 year old who has had a longstanding history with her back, underwent previous L1- L5 fusion, did very well.  However, over the last several weeks to months, had progressive worsening back pain.  Workup as an outpatient has revealed increased signal on T11-12 and T12-L1 disk spaces.  Disk space aspirate per CT-guided biopsy revealed a high white cell count, but no bacteria.  The patient also had a sed rate that was 115 as an outpatient, so the patient was presumptively placed on IV antibiotics, treated as a diskitis, however, no bacteria was cultured.  This was discussed with Dr. Ninetta Lights who recommended potentially repeating  the CT- guided biopsy; however, the patient just had it couple days prior and due to her age and somewhat debilitated condition, I decided to empirically treat her with antibiotics, fall her sed rate and see if we get exact improvement.  The patient presented to the ER last night with worsening pain control and back spasms that were radiating down her right leg into her shin and calf, and so we were reconsulted.  PAST MEDICAL HISTORY:  Remarkable for hypertension.  SURGICAL HISTORY:  Back surgery.  She is a nonsmoker.  She has allergies to PENICILLIN and AMLODIPINE.  She is on aspirin, flecainide, Flexeril, Lopressor, Lyrica, Percocet, Xanax, and aspirin.  PHYSICAL EXAMINATION:  The patient is very pleasant 77 year old female resting comfortably in bed.  Her lower extremity strength is 5/5  in her iliopsoas, quads, hamstrings, gastrocs, and EHL.  She does have some pain limited weakness with proximal right lower extremity secondary to spasms.  Remainder of her physical exam was unremarkable per ER records.  ASSESSMENT:  A 77 year old with intractable pain from T11-12 and T12-L1 diskitis.  We will admit the patient.  We will follow up C-reactive protein and sedimentation rate. We will place the patient on IV vancomycin as well as add a second agent, gentamicin, and we will consult ID and based on the results of her sedimentation rate and C-reactive protein, may elect a repeat imaging with the CT or MRI.  We will also get Physical Therapy to work with the patient.          ______________________________ Donalee Citrin, M.D.     GC/MEDQ  D:  07/13/2010  T:  07/13/2010  Job:  [id number]  Electronically Signed by Donalee Citrin M.D. on 07/21/2010 11:27:28 AM

## 2010-07-23 ENCOUNTER — Inpatient Hospital Stay (HOSPITAL_COMMUNITY): Payer: Medicare Other

## 2010-07-23 LAB — BODY FLUID CULTURE

## 2010-07-25 LAB — BASIC METABOLIC PANEL
BUN: 15 mg/dL (ref 6–23)
CO2: 31 mEq/L (ref 19–32)
Chloride: 101 mEq/L (ref 96–112)
Creatinine, Ser: 0.61 mg/dL (ref 0.4–1.2)
Glucose, Bld: 105 mg/dL — ABNORMAL HIGH (ref 70–99)

## 2010-07-25 LAB — ANAEROBIC CULTURE: Gram Stain: NONE SEEN

## 2010-07-26 DIAGNOSIS — M519 Unspecified thoracic, thoracolumbar and lumbosacral intervertebral disc disorder: Secondary | ICD-10-CM

## 2010-08-02 NOTE — Op Note (Signed)
NAMEMarland Kitchen  [name], [name] NO.:  [id number]   MEDICAL RECORD NO.:  [id number]          PATIENT TYPE:  INP   LOCATION:  3102                         FACILITY:  [facility]   PHYSICIAN:  Donalee Citrin, M.D.        DATE OF BIRTH:  [birth year]   DATE OF PROCEDURE:  12/02/2007  DATE OF DISCHARGE:                               OPERATIVE REPORT   PREOPERATIVE DIAGNOSES:  1. Lumbar spinal stenosis L1-2, 2-3, 3-4, and 4-5.  2. Degenerative disk disease L1 through L5.  3. Grade 1 spondylolisthesis L4-5.   PROCEDURES:  Decompressive lumbar laminectomies and excess will be  needed with standard interbody fusion L1-2, 2-3, 3-4, and 4-5; posterior  lumbar interbody fusion L2-3, 3-4, and 4-5 using hybrid Telamon PEEK  cage packed with locally harvested autograft on one side and the tangent  allograft wedge on the other; pedicle screw fixation L1 through L5 using  the 6.35 Legacy pedicle screw system; posterolateral arthrodesis L1  through L5 using local harvested autograft mixed with Actifuse bone  substitute and BMP posterolaterally; placement of 2 medium Hemovac  drains.   SURGEON:  Donalee Citrin, MD.   ASSISTANT:  Kathaleen Maser. Pool, MD   ANESTHESIA:  General endotracheal.   HISTORY OF PRESENT ILLNESS:  The patient is a very pleasant 77 year old  female who has had progressive worsening back and bilateral leg pain  with neurogenic claudication after a very short distances.  The patient  failed all forms of conservative treatment.  MRI scan showed severe  degenerative disk disease at L1-2, 2-3, 3-4, and 4-5 which had very  degenerative collapse and grade 1 spondylolisthesis at L4-5 marked facet  arthropathy and biforaminal stenosis of the 2, 3, 4, and 5 nerve roots.  The patient failed all forms of conservative treatment and I ultimately  recommended decompression and stabilization of procedure with  symptomatology and predominant back pain greater than leg pain with  neurogenic  claudication.  Risks and benefits of the operation were  explained to the patient.  She understood and agreed to proceed forward.   The patient was brought to the OR and was induced with general  anesthesia.  The patient was placed in prone on Wilson frame.  Back was  prepped and draped in the usual sterile fashion.  No preoperative x-ray  was taken.  A midline incision was placed after administration of 10 mL  of lidocaine with epi extending from what believed to be the L1 spinous  process down to the sacrum.  Then, subperiosteal dissection was carried  out on the lamina of L1, 2, 3, 4, and 5.  Intraoperative x-ray confirmed  localization and identification of the L3 TP pedicle.  So, after  complete exposure had been obtained bilaterally from L1 down through L5  spinous processes at L1, 2, 3, and 4 were all removed.  The patient was  doing remarkably lordosed with severe overriding of the lamina and facet  complexes at 3-4 and 4-5.  Medial facet complexes were all drilled down  at 1-2, 2-3, 3-4, and 4-5 bilaterally.  Then, central decompression was  begun, first working at 2-3.  The dura was identified marching centrally  up removing the entirety of the L2 and the L1 lamina.  Then, complete  medial facetectomies were performed at 1-2 and 2-3.  Marked facet  arthropathy was causing severe displacement at both 2 and 3 root at  these levels as well as the L1 neural foramen was also markedly  stenotic.  This was all aggressively unroofed, and the L1, 2, and 3  nerves were skeletonized, flushed with the pedicle.  Then, working down  at 3-4 and 4-5 with marked hyperlordosis and overriding on the facet  complex and marked facet arthropathy, these were all drilled down using  a 2-mm Kerrison punch.  Complete central decompression and complete  medial facetectomies were performed at 3-4 and 4-5.  Extensive facet  arthropathy and ligamentous hypertrophy was noted at these levels, which  was all  teased away with a #4 Penfield, removed, and completely  decompressed the 4 and the 5 roots bilaterally at this level.  At the  end of decompression, there was no further stenosis at any of the above  4 levels at 1-2, 2-3, 3-4, and 4-5.  Attention was then taken to pedicle  screw placement.  The pedicles were noted to be very small at all  levels, so a 4.5 taps were used and 5.5 screws, first working inferiorly  on the left side at L5.  The bony landmarks from outside of the canal  were used to identify the TP and a complex view directly visualizing the  inside of the canal.  A pilot hole was drilled.  Fluoroscopy confirmed a  good trajectory and position.  Pilot hole was drilled and cannula with  the awl probed, tapped with a 4.5 tap, probed again, and a 5.5 x 45  screw was inserted at L5 on the left.  Then working at L4 in a similar  fashion, a 5.5 x 40 screw was inserted with a 5.5 x 40 screw at L3, L2,  and L1.  There was noted to be break out at L3 on the left laterally to  this and then repositioned medially.  Working and directly inspecting  the medial border canal, there was no medial breach of any of the  screws.  On the right side at L1 and L5 in a similar fashion, all 5  screws were inserted with 5.5 x 40s at L1, 2, 3, and 4, 5.5 x 45 at L5.  After all 10 screws were placed, attention was taken to the interbody  work, first working on the left-sided L4-5 and L3-4.  Annulotomies were  made with an #11 blade scalpel.  This disk was cleaned out and a size 10  distractor was inserted at 4-5 and a size 11 at 3-4.  Then working on  the right side with a D'Errico reflecting the right L5 nerve root  medially, annulotomies were made. Disk space was cleaned out with a size  10 cutter and chisel were used to prepare the endplates.  Fluoroscopy  confirmed at each level on the way appropriate sizing of the grafts and  the cutter and chisel.  After the endplates had been prepared, the disk   spaces were cleaned out.  A right-sided Telamon cage was inserted,  packed with local autograft, mixed with Actifuse.  Then, the left side  distractor was removed.  Then at L3-4 on the right in a similar fashion,  disk space was prepped with a size 10  cutter and chisel and a tangent  was inserted on the right at L3-4.  Then working on the left in a  similar fashion, the disk spaces were cleaned out with a size 10 cutter  and chisel.  Local autograft was packed against the cage and graft  respectively, and then on the left side, a tangent was inserted at L4-5  and a Telamon at L3-4.  After these 2 levels, attention was then taken  to 2-3.  Again in a similar fashion, the L3 nerve root was reflected  medially and disk space was cleaned out.  There was noted to be large  central herniations both at 2-3 and 3-4, at 4-5 has predominantly facet  arthropathy causing stenosis there.  Working at 2-3 disk space, the  central disk herniation was cleaned out.  A size 8 distractor had a good  apposition of the endplates and due to the narrowness of the canal at 2-  3, this was felt to be appropriate sizing for graft material as well as.  So, this was cleaned out with a size 8 cutter and chisel.  A Telamon was  inserted on the right.  Local autograft packed, second Telamon and  tangent inserted on the left.  After all the interbody work had been  done and fluoroscopy confirmed good position of the grafts, the wound  was then copiously irrigated and meticulous hemostasis was maintained.  Aggressive decortication was carried out in TPs and lateral gutters.  Actifuse was packed and lateral gutters from L1 down to L5.  BMP-soaked  sponges were placed over the top of the Actifuse and then more Actifuse  was placed on top of the BMP creating a sandwich of BMP in the middle.  All the graft material and Actifuse was packed posterolaterally.  Then,  two 100-mm rods were fashioned and bent and inserted in top  fashion down  at L5.  The L4 screws compressed against the L5 gently, then the L3 was  compressed against L4, the L2 compressed against the L3, and the L1  compressed against the L2.  All nuts were torqued down and anchored to  place.  The rod was visualized at both ends and noted to be appropriate  size.  Then, the neural foramina were then reinspected.  Gelfoam was  laid on top of the dura, 0.420 crosslink was inserted between the 2 and  the 3 screws.  Then, 2 medium Hemovac drains were placed.  Postop  fluoroscopy confirmed good position of the screws, rods, and bone  grafts.  Then, the wound was closed in layers with interrupted Vicryl and with  running 4-0 subcuticular in the skin.  Benzoin and Steri-Strips applied.  The patient went to the recovery room in stable condition.  At the end  of the case, sponge counts were correct.           ______________________________  Donalee Citrin, M.D.     GC/MEDQ  D:  12/02/2007  T:  12/03/2007  Job:  [id number]

## 2010-08-04 ENCOUNTER — Other Ambulatory Visit: Payer: Self-pay | Admitting: Neurosurgery

## 2010-08-04 ENCOUNTER — Ambulatory Visit
Admission: RE | Admit: 2010-08-04 | Discharge: 2010-08-04 | Disposition: A | Discharge status: Home / Self Care | Payer: Medicare Other | Source: Ambulatory Visit | Attending: Neurosurgery | Admitting: Neurosurgery

## 2010-08-04 DIAGNOSIS — M25551 Pain in right hip: Secondary | ICD-10-CM

## 2010-08-04 DIAGNOSIS — M549 Dorsalgia, unspecified: Secondary | ICD-10-CM

## 2010-08-05 NOTE — Discharge Summary (Signed)
NAMEMarland Kitchen  TEQUIA, WOLMAN NO.:  [id number]   MEDICAL RECORD NO.:  [id number]          PATIENT TYPE:  INP   LOCATION:  3006                         FACILITY:  [facility]   PHYSICIAN:  Donalee Citrin, M.D.        DATE OF BIRTH:  [birth year]   DATE OF ADMISSION:  12/02/2007  DATE OF DISCHARGE:  12/06/2007                               DISCHARGE SUMMARY   ADMITTING DIAGNOSIS:  Lumbar spinal stenosis from L1 down to L5.   PROCEDURE:  Decompressive lumbar laminectomy L1 through L5 with  posterior lumbar interbody fusion L2-3, L3-4, and L4-5.   DISCHARGE DIAGNOSIS:  Lumbar spinal stenosis from L1 down to L5.   HOSPITAL COURSE:  The patient is a very pleasant 77 year old female who  was admitted VMA and went to the operating room in order to perform the  above mentioned procedure.  Postop, the patient did very well.  On the  floor, the patient was afebrile, tolerating a regular diet with  significant improvement of her leg pain.  The patient was mobilized  progressively with physical and occupational therapy and did very well.  Pain came under better control with oral analgesics.  The Foley was able  to be taken out in day #3 and her Hemovac was taken out on day #3.  Physical and Occupational Therapy was able to clear her for discharge.  The patient was is able to be discharged on hospital day #4.  At the  time of discharge, the was neurologically intact, tolerating pain well  on pain medication.           ______________________________  Donalee Citrin, M.D.     GC/MEDQ  D:  01/22/2008  T:  01/23/2008  Job:  [id number]

## 2010-08-05 NOTE — Op Note (Signed)
[place]  Patient:    [name], SWOFFORD                       MRN: [id number] Proc. Date: 08/21/00 Adm. Date:  [id number] Attending:  Twana First                           Operative Report  PREOPERATIVE DIAGNOSES: 1. Right shoulder rotator cuff tear. 2. Right shoulder glenoid labrum tear. 3. Right shoulder impingement. 4. Right shoulder acromioclavicular joint degenerative joint disease.  POSTOPERATIVE DIAGNOSES: 1. Right shoulder rotator cuff tear. 2. Right shoulder glenoid labrum tear. 3. Right shoulder impingement. 4. Right shoulder acromioclavicular joint degenerative joint disease.  PROCEDURE: 1. Right shoulder examination under anesthesia, followed by arthroscopic    partial labrum tear and debridement. 2. Right shoulder open rotator cuff repair. 3. Right shoulder subacromial decompression. 4. Right shoulder distal clavicle excision.  SURGEON:  Elana Alm. Thurston Hole, M.D.  ASSISTANT:  Julien Girt, P.A.  ANESTHESIA:  General.  OPERATIVE TIME:  One hour.  COMPLICATIONS:  None.  INDICATIONS:  [name] is a 77 year old woman who has had significant right shoulder pain for the last three months, increasing in nature, with signs and symptoms and MRI documenting a rotator cuff tear, impingement, and an AC joint arthrosis, who has failed conservative care and is now to undergo an arthroscopy and a rotator cuff repair.  DESCRIPTION OF PROCEDURE:  [name] is brought to the operating room on August 21, 2000, and placed on the operating room table in the supine position after a supraclavicular block had been placed in the holding room.  She is placed on the operating room table in the supine position.  After an adequate level of general anesthesia was obtained, her right shoulder was examined under anesthesia.  She had a full range of motion, and her shoulder was stable to ligamentous examination.  She was then placed in the  beach chair position and her shoulder and arm were prepped using sterile Betadine and draped using a sterile technique.  She received Ancef 1 g IV preoperatively for prophylaxis.  Initially through the posterior arthroscopic portal the arthroscope with a pump attached was placed, and through an anterior portal an arthroscopic probe was placed.  On initial inspection the articular cartilage and glenohumeral joint showed mild grade 1-2 chondromalacia.  The anterior and posterior labrum showed moderate tearing 25%, which was debrided.  The superior labrum and biceps tendon anchor was intact.  The biceps tendon was intact.  The inferior labrum and anterior inferior glenohumeral ligament complex was intact.  The rotator cuff showed a complete tear of the supraspinatus and a partial tear of the infraspinatus.  This was debrided arthroscopically.  A lateral arthroscopic portal was made.  The subacromial space was entered.  The subacromial decompression was carried out, removing 6.0 to 8.0 mm of the undersurface of the anterior, anterolateral, and anteromedial acromion.  The [facility] joint was exposed.  Significant degenerative changes were noted in this joint, and then the distal 5.0 to 6.0 mm of the clavicle was resected under arthroscopic visualization with the 6.0 mm bur. After this was done, then through the lateral portal a deltoid-splitting approach was made 3.0 cm.  The subacromial space was entered.  The rotator cuff tear was thoroughly sharply debrided back to more healthy-appearing tissue.  Then an Arthrax suture anchor was placed in the greater tuberosity, and each of  the suture limbs were brought through the tear in the rotator cuff and tied down, thus securing the cuff back down to the greater tuberosity. After this was done, then the shoulder could be brought through a full range of motion with no impingement upon the repair.  The wound was irrigated and the subacromial space had a pain  catheter placed for postoperative pain control.  The deltoid fascia was then closed with #2-0 Vicryl.  The subcutaneous tissues were closed with #2-0 Vicryl, and the subcuticular layer closed with #3-0 Prolene.  Steri-Strips were applied.  Sterile dressings and a sling applied.  The patient was awakened and returned to the recovery room in stable condition.  FOLLOW-UP CARE:  Ms. Capasso will be followed overnight in the recovery care center for IV pain control and neurovascular monitoring.  She will be discharged tomorrow on Percocet for pain, and Naprosyn.  She will have her pain catheter removed by her husband in two days per our instruction.  She will begin early physical therapy for range of motion, followed by strengthening in six weeks.DD:  08/21/00 TD:  08/21/00 Job: [id number]

## 2010-08-09 ENCOUNTER — Inpatient Hospital Stay (HOSPITAL_COMMUNITY): Payer: Medicare Other

## 2010-08-09 ENCOUNTER — Inpatient Hospital Stay (HOSPITAL_COMMUNITY)
Admission: AD | Admit: 2010-08-09 | Discharge: 2010-08-23 | DRG: 456 | Disposition: A | Discharge status: Home Health | Payer: Medicare Other | Source: Ambulatory Visit | Attending: Neurosurgery | Admitting: Neurosurgery

## 2010-08-09 DIAGNOSIS — I4891 Unspecified atrial fibrillation: Secondary | ICD-10-CM | POA: Diagnosis present

## 2010-08-09 DIAGNOSIS — Z472 Encounter for removal of internal fixation device: Secondary | ICD-10-CM

## 2010-08-09 DIAGNOSIS — M519 Unspecified thoracic, thoracolumbar and lumbosacral intervertebral disc disorder: Principal | ICD-10-CM | POA: Diagnosis present

## 2010-08-09 DIAGNOSIS — I10 Essential (primary) hypertension: Secondary | ICD-10-CM | POA: Diagnosis present

## 2010-08-09 DIAGNOSIS — Z981 Arthrodesis status: Secondary | ICD-10-CM

## 2010-08-09 DIAGNOSIS — M869 Osteomyelitis, unspecified: Secondary | ICD-10-CM | POA: Diagnosis present

## 2010-08-09 DIAGNOSIS — G061 Intraspinal abscess and granuloma: Secondary | ICD-10-CM | POA: Diagnosis present

## 2010-08-10 ENCOUNTER — Inpatient Hospital Stay (HOSPITAL_COMMUNITY): Payer: Medicare Other

## 2010-08-10 MED ORDER — GADOBENATE DIMEGLUMINE 529 MG/ML IV SOLN
14.0000 mL | Freq: Once | INTRAVENOUS | Status: AC | PRN
  Administered 2010-08-10: 14 mL via INTRAVENOUS

## 2010-08-12 LAB — VANCOMYCIN, TROUGH: Vancomycin Tr: 17.2 ug/mL (ref 10.0–20.0)

## 2010-08-12 LAB — CREATININE, SERUM
Creatinine, Ser: 0.57 mg/dL (ref 0.4–1.2)
GFR calc non Af Amer: >60 mL/min (ref >60)

## 2010-08-15 LAB — BASIC METABOLIC PANEL
BUN: 18 mg/dL (ref 6–23)
Chloride: 98 mEq/L (ref 96–112)
GFR calc Af Amer: >60 mL/min (ref >60)
GFR calc non Af Amer: >60 mL/min (ref >60)
Potassium: 3.8 mEq/L (ref 3.5–5.1)

## 2010-08-15 LAB — CBC
Platelets: 338 10*3/uL (ref 150–400)
RBC: 3.95 MIL/uL (ref 3.87–5.11)
WBC: 6.4 10*3/uL (ref 4.0–10.5)

## 2010-08-16 ENCOUNTER — Inpatient Hospital Stay (HOSPITAL_COMMUNITY): Payer: Medicare Other

## 2010-08-16 LAB — BASIC METABOLIC PANEL
Chloride: 94 mEq/L — ABNORMAL LOW (ref 96–112)
GFR calc Af Amer: >60 mL/min (ref >60)
Potassium: 3.6 mEq/L (ref 3.5–5.1)
Sodium: 133 mEq/L — ABNORMAL LOW (ref 135–145)

## 2010-08-16 LAB — SURGICAL PCR SCREEN
MRSA, PCR: NEGATIVE
Staphylococcus aureus: NEGATIVE

## 2010-08-16 LAB — C-REACTIVE PROTEIN: CRP: 0.1 mg/dL — ABNORMAL LOW (ref <0.6)

## 2010-08-16 LAB — CBC
Hemoglobin: 11.6 g/dL — ABNORMAL LOW (ref 12.0–15.0)
MCH: 29.5 pg (ref 26.0–34.0)
MCHC: 33 g/dL (ref 30.0–36.0)
MCV: 89.3 fL (ref 78.0–100.0)
RBC: 3.93 MIL/uL (ref 3.87–5.11)

## 2010-08-16 LAB — SEDIMENTATION RATE: Sed Rate: 17 mm/hr (ref 0–22)

## 2010-08-17 ENCOUNTER — Inpatient Hospital Stay (HOSPITAL_COMMUNITY): Payer: Medicare Other

## 2010-08-18 LAB — CBC
Hemoglobin: 9.1 g/dL — ABNORMAL LOW (ref 12.0–15.0)
Hemoglobin: 8.8 g/dL — ABNORMAL LOW (ref 12.0–15.0)
MCHC: 33.7 g/dL (ref 30.0–36.0)
RBC: 3 MIL/uL — ABNORMAL LOW (ref 3.87–5.11)
RBC: 2.85 MIL/uL — ABNORMAL LOW (ref 3.87–5.11)
WBC: 7.1 10*3/uL (ref 4.0–10.5)

## 2010-08-18 LAB — BASIC METABOLIC PANEL
CO2: 27 mEq/L (ref 19–32)
Calcium: 7.1 mg/dL — ABNORMAL LOW (ref 8.4–10.5)
GFR calc Af Amer: >60 mL/min (ref >60)
Potassium: 3.9 mEq/L (ref 3.5–5.1)
Sodium: 132 mEq/L — ABNORMAL LOW (ref 135–145)

## 2010-08-18 LAB — OSMOLALITY, URINE: Osmolality, Ur: 620 mOsm/kg (ref 390–1090)

## 2010-08-18 LAB — FUNGUS CULTURE W SMEAR: Fungal Smear: NONE SEEN

## 2010-08-19 LAB — CROSSMATCH
ABO/RH(D): A POS
Unit division: 0
Unit division: 0

## 2010-08-19 LAB — CBC
MCV: 88.9 fL (ref 78.0–100.0)
Platelets: 193 10*3/uL (ref 150–400)
RDW: 13.8 % (ref 11.5–15.5)
WBC: 9.1 10*3/uL (ref 4.0–10.5)

## 2010-08-19 NOTE — Op Note (Signed)
NAME:  [name], KORBER NO.:  [id number]  MEDICAL RECORD NO.:  [id number]           PATIENT TYPE:  I  LOCATION:  3110                         FACILITY:  [facility]  PHYSICIAN:  Donalee Citrin, M.D.        DATE OF BIRTH:  [birth year]  DATE OF PROCEDURE:  08/17/2010 DATE OF DISCHARGE:                              OPERATIVE REPORT   PREOPERATIVE DIAGNOSIS:  Diskitis T11-12 and T12-L1, osteomyelitis T11 and T12, instability at T12 and epidural abscess at T12.  PROCEDURE:  Decompressive thoracic laminectomies at T11-12 and T12-L1 for evacuation of epidural abscess, exploration of fusion, removal of hardware from L1 to L5 with removal of the L1 pedicle screws, pedicle screw fixation T8-L5 using the [employer]am[employer] Danek 6.35 Legacy pedicle screw system, posterolateral arthrodesis T8-L5 using Actifuse and BMP, placement of 420 cross-link and a large Hemovac drain.  SURGEON:  Donalee Citrin, MD  ASSISTANT:  Tia Alert, MD  ANESTHESIA:  General endotracheal.  HISTORY OF PRESENT ILLNESS:  The patient is a 77 year old female who has had diskitis, osteomyelitis, had been refractory to antibiotic treatment, waxing and waning clinical exam with persistent right lower extremity pain.  She had no weakness.  She had no neurologic deficits. Serial and progressive CT scans and MRI scans showed progressive deterioration and collapse of the T12 body with progressive epidural component with circumferential compression of the spinal cord and progressive kyphosis.  So, the patient was recommended after several weeks off IV antibiotics, decompression and stabilization procedure. Risks and benefits of the operation were explained to the patient, she understood and agreed to proceed forward.  The patient was brought to the OR, induced under general anesthesia. She was placed prone on the Wilson frame.  Back was prepped and draped in the usual sterile fashion.  Her old incision was opened up  and extended cephalad.  Subperiosteal dissection was carried out to the lamina of T7, T8, T9, T10, T11, and T12 and then the hardware was exposed from L1-L5.  Extensive bony overgrowth of all hardware was immediately identified.  This was all bitten off the top of the nuts of the screws as well as the rods, exposing the cross-link and exposing the rods.  The old hardware was removed.  There appeared to be solid posterolateral fusion extending from L1 down to L5, but the L1 screw was loosened and removed.  Decompression was begun with removal of spinous processes at T11 and T12.  Central decompression was begun with 2-mm Kerrison punch.  There was excessive amount of inflammatory phlegmon in the epidural space, predominantly at T12 but also dorsally at T11.  This was all teased away.  Multiple cultures were sent of both fluid paraspinal as well as epidural as well as several specimens from the phlegmon was also sent for culture.  After adequate central decompression had been performed and completed, attention was taken to the pedicle screw placement.  Using high-speed drill and using combination of AP and lateral fluoroscopy, pilot holes were drilled in the thoracic spine at T8, T9, T10, and T11 and that was all done through AP fluoro and then fluoro  was gone back to lateral and using the lateral fluoro, the pilot holes were then cannulated with the awl, probed, tapped with a 4.5 tap, probed again, and 5.5 x 40 screws were inserted at T8, T9, T10, and 11 bilaterally, all in the same fashion.  All pedicles probed were competent with no evidence of mediolateral breach. Postop fluoroscopy both AP and lateral confirmed good position of screws.  Then, at this point, the wound was copiously irrigated. Meticulous hemostasis was maintained.  Two rods were contoured extending from L5 up to T8.  Then these were locked down with the counter torque mechanism and non breakoff screws.  Then aggressive  decortication was carried out along the T-piece and lateral gutters from T8 down L5.  A 40 mL of Actifuse and large BMP kit was packed posterolaterally along this area.  Then the epidural space was then re-explored to confirm patency. Gelfoam was laid on top of the epidural space and a large 420 cross-link applied at T12 and then large Hemovac drain was placed.  The wound was then subsequently closed in layers with interrupted Vicryl and skin was closed in running 4-0 subcuticular.  Benzoin and Steri-Strips were applied.  The patient went to recovery room in stable condition.          ______________________________ Donalee Citrin, M.D.     GC/MEDQ  D:  08/17/2010  T:  08/18/2010  Job:  [id number]  Electronically Signed by Donalee Citrin M.D. on 08/19/2010 06:54:13 AM

## 2010-08-19 NOTE — Discharge Summary (Signed)
  NAME:  [name], [name] NO.:  [id number]  MEDICAL RECORD NO.:  [id number]           PATIENT TYPE:  I  LOCATION:  3009                         FACILITY:  [facility]  PHYSICIAN:  Donalee Citrin, M.D.        DATE OF BIRTH:  1932-01-25  DATE OF ADMISSION:  07/12/2010 DATE OF DISCHARGE:  07/27/2010                              DISCHARGE SUMMARY   ADMITTING DIAGNOSIS:  T11-12 and T12-L1 diskitis with T12 osteomyelitis.  DISCHARGE DIAGNOSIS:  T11-12 degenerative disk disease and T12 compression fracture.  HISTORY OF PRESENT ILLNESS:  The patient is a 77 year old female who was admitted through the emergency department in acute pain crisis who had been treated as an outpatient for possible diskitis at T11-12.  She had undergone CT-guided biopsy that showed no organisms just white blood cells.  The patient was empirically placed on antibiotics, and she had a slight decrease in her sed rate with treatment from 115 down to 90. However, the patient presented to emergency room in acute pain crisis and had to be admitted for observation, was admitted and kept on her IV antibiotics, vancomycin and gentamicin, and switched over to vancomycin and Rocephin, was seen by Infectious Disease.  We recommended stopping the patient's antibiotics for 48 hours and undergoing another CT-guided aspiration of the T12 vertebral body.  That aspiration showed no white cells, no bacteria, and the patient was followed up with followup sed rates and C-reactive protein which declined down to 80 and it was felt at this time the patient may not actually have an infection, certainly no bacteria was cultured from the aspirate.  So, by Jul 27, 2010, the patient was ambulating with physical therapy, tolerating a diet.  Her bladder was working with no significant postvoid residual.  Her pain was well controlled on oral pain medication.  She had no elevations in her white count, was running no fevers, and she was  ambulating well without brace, so we felt it was stable to discharge her home off all her antibiotics with close followup in 1-2 weeks with followup sed rate and C-reactive proteins.  It was discussed with the patient that should she fail and pain get worse, she may actually have to undergo a stabilization procedure, but both CT scan and MRI scan showed no significant epidural extension of whenever this was and no significant spinal cord compression, so we will continue to manage the patient in a brace and follow closely as an outpatient, and we will discharge the patient on p.o. pain medication.          ______________________________ Donalee Citrin, M.D.     GC/MEDQ  D:  07/27/2010  T:  07/27/2010  Job:  [id number]  Electronically Signed by Donalee Citrin M.D. on 08/19/2010 06:53:37 AM

## 2010-08-19 NOTE — H&P (Signed)
NAMEMarland Kitchen  [name], [name] NO.:  [id number]  MEDICAL RECORD NO.:  [id number]           PATIENT TYPE:  I  LOCATION:  3010                         FACILITY:  [facility]  PHYSICIAN:  Donalee Citrin, M.D.        DATE OF BIRTH:  04/28/1931  DATE OF ADMISSION:  08/09/2010 DATE OF DISCHARGE:                             HISTORY & PHYSICAL   ADMITTING DIAGNOSIS:  Intractable low back pain, right leg radiculopathy, muscle spasms.  HISTORY OF PRESENT ILLNESS:  The patient is a very pleasant 77 year old female who has had longstanding history of back trouble.  She has had previously undergone L1-L5 fusion.  She was recently discharged from the hospital less than a month ago for management of what was presumed to be a T11-12 and T12-L1 diskitis and osteomyelitis.  She had been treated with IV antibiotics.  She underwent re-exploration of her disk spaces which did not grew out anything.  Her sed rates have progressively declined, where now most recent, last couple of days, she had a sed rate of 10, so the likelihood of this being discussed osteomyelitis is extremely low.  I told this is representing more degenerative changes around the displaced levels.  However, she did come back to the office today with worsening in severe low back pain around the L5-S1 and upper sacral area, very dysesthetic to touch with pain that would radiate down to the right leg, predominantly the back of her leg.  She does have a positive straight leg raise as well as a cross straight leg raise.  So this is very difficult for together especially with a severe hypesthetic and dysesthetic pain she has in her sacral spine.  Her bowel and bladder working okay.  She has no numbness or tingling.  PAST MEDICAL HISTORY:  Remarkable for hypertension.  PAST SURGICAL HISTORY:  Previous back surgeries.  ALLERGIES:  She has allergies to AMLODIPINE and PENICILLIN.  Her current medication list includes p.o. Dilaudid as  well as aspirin, flecainide, Flexeril, Lopressor, Lyrica, and Xanax.  On physical exam, the patient is a very pleasant 77 year old female with a progressive worsening back and right leg pain.  Very hypesthetic to touch around the lower sacral area right above her tailbone.  She has full strength in iliopsoas, quads, hamstrings, gastrocs and EHL.  She has positive straight leg raising and positive cross straight leg raise on the right.  She has normal sensation in lower extremities.  She is sitting in a wheelchair very uncomfortable with frequent muscle spasms.  ASSESSMENT:  A 77 year old with intractable low back and leg pain.  She is recently discharged after management of diskitis, however sed rate is normal.  We admitted her for pain control as well as workup of her severe low back pain with CT scans of the thoracic and lumbar and pelvis rule out any occult fracture.  Sacral insufficiency fracture for which she has had sacral biopsy 4x4, any further sign of breakdown diskitis or correction of degenerative changes either in the L5-S1 area or new sequence of tissue fractures.          ______________________________ Donalee Citrin,  M.D.     GC/MEDQ  D:  08/09/2010  T:  08/10/2010  Job:  [id number]  Electronically Signed by Donalee Citrin M.D. on 08/19/2010 06:54:09 AM

## 2010-08-20 LAB — CULTURE, ROUTINE-ABSCESS
Culture: NO GROWTH
Culture: NO GROWTH
Gram Stain: NONE SEEN
Gram Stain: NONE SEEN

## 2010-08-20 LAB — BODY FLUID CULTURE: Gram Stain: NONE SEEN

## 2010-08-22 LAB — ANAEROBIC CULTURE
Gram Stain: NONE SEEN
Gram Stain: NONE SEEN

## 2010-09-02 LAB — AFB CULTURE WITH SMEAR (NOT AT ARMC)

## 2010-09-15 NOTE — Discharge Summary (Signed)
  NAMEMarland Kitchen  [name], [name] NO.:  [id number]  MEDICAL RECORD NO.:  [id number]  LOCATION:  3008                         FACILITY:  [facility]  PHYSICIAN:  Donalee Citrin, M.D.        DATE OF BIRTH:  1931/04/13  DATE OF ADMISSION:  08/09/2010 DATE OF DISCHARGE:  08/23/2010                              DISCHARGE SUMMARY   ADMITTING DIAGNOSES: 1. Diskitis T11-T12 and T12-L1. 2. Osteomyelitis, T11-T12.  PROCEDURE DURING THIS HOSPITALIZATION:  Decompressive thoracic laminectomy at T11-T12 and T12-L1 for evacuation of epidural abscess, exploration of fusion, and extension of fusion from T8 down to L5.  HOSPITAL COURSE:  The patient was admitted through the office on Aug 09, 2010.  A CT scan of thoracic lumbar sacrum showed progressive deterioration of degenerative disk disease and diskitis, and osteomyelitis at the aforementioned levels.  The patient was subsequently stabilized medically.  Followup sed rate showed an elevation of her inflammatory marker and she had been off antibiotics. The patient was back, initiated antibiotics for several days and she desired for surgical stabilization.  During this hospitalization, she underwent the aforementioned stabilization procedure.  Postoperatively, she did very well and recovered on the floor.  On the floor, she was noted to have a low hematocrit of 25 with hemoglobin of 8.  She was transfused 2 units.  She tolerated this very well and felt quite a bit better.  She then progressively mobilized with physical therapy and was doing very well, and was able to be discharged home on August 23, 2010 with scheduled followup in approximately 1 week.  At the time of discharge, the patient was ambulating and voiding spontaneously, tolerating a regular diet, and pain was well controlled.          ______________________________ Donalee Citrin, M.D.     GC/MEDQ  D:  09/07/2010  T:  09/07/2010  Job:  [id number]  Electronically Signed by Donalee Citrin M.D.  on 09/15/2010 03:03:57 PM

## 2010-09-29 ENCOUNTER — Ambulatory Visit
Admission: RE | Admit: 2010-09-29 | Discharge: 2010-09-29 | Disposition: A | Discharge status: Home / Self Care | Payer: Medicare Other | Source: Ambulatory Visit | Attending: Neurosurgery | Admitting: Neurosurgery

## 2010-09-29 ENCOUNTER — Other Ambulatory Visit: Payer: Self-pay | Admitting: Neurosurgery

## 2010-09-29 DIAGNOSIS — M545 Low back pain: Secondary | ICD-10-CM

## 2010-09-29 LAB — AFB CULTURE WITH SMEAR (NOT AT ARMC): Acid Fast Smear: NONE SEEN

## 2010-12-01 ENCOUNTER — Other Ambulatory Visit: Payer: Self-pay | Admitting: Neurosurgery

## 2010-12-01 ENCOUNTER — Ambulatory Visit
Admission: RE | Admit: 2010-12-01 | Discharge: 2010-12-01 | Disposition: A | Discharge status: Home / Self Care | Payer: Medicare Other | Source: Ambulatory Visit | Attending: Neurosurgery | Admitting: Neurosurgery

## 2010-12-01 DIAGNOSIS — M549 Dorsalgia, unspecified: Secondary | ICD-10-CM

## 2010-12-01 DIAGNOSIS — M545 Low back pain, unspecified: Secondary | ICD-10-CM

## 2010-12-01 DIAGNOSIS — M539 Dorsopathy, unspecified: Secondary | ICD-10-CM

## 2010-12-21 LAB — CBC
MCHC: 33.2
MCHC: 33.7
RBC: 3.84 — ABNORMAL LOW
RDW: 13.4
RDW: 13.6

## 2010-12-21 LAB — BASIC METABOLIC PANEL
CO2: 23
CO2: 28
Calcium: 7.6 — ABNORMAL LOW
Calcium: 9.4
Creatinine, Ser: 0.64
Creatinine, Ser: 0.72
GFR calc Af Amer: >60
GFR calc Af Amer: >60
Glucose, Bld: 191 — ABNORMAL HIGH

## 2010-12-21 LAB — TYPE AND SCREEN
ABO/RH(D): A POS
Antibody Screen: NEGATIVE

## 2010-12-21 LAB — DIFFERENTIAL
Basophils Absolute: 0
Basophils Relative: 0
Neutro Abs: 6.9
Neutrophils Relative %: 85 — ABNORMAL HIGH

## 2010-12-26 ENCOUNTER — Emergency Department (HOSPITAL_COMMUNITY): Payer: Medicare Other

## 2010-12-26 ENCOUNTER — Encounter (HOSPITAL_COMMUNITY): Payer: Self-pay

## 2010-12-26 ENCOUNTER — Inpatient Hospital Stay (HOSPITAL_COMMUNITY)
Admission: EM | Admit: 2010-12-26 | Discharge: 2011-01-02 | DRG: 496 | Disposition: A | Discharge status: Home Health | Payer: Medicare Other | Attending: Neurosurgery | Admitting: Neurosurgery

## 2010-12-26 DIAGNOSIS — Z950 Presence of cardiac pacemaker: Secondary | ICD-10-CM

## 2010-12-26 DIAGNOSIS — E669 Obesity, unspecified: Secondary | ICD-10-CM | POA: Diagnosis present

## 2010-12-26 DIAGNOSIS — Z472 Encounter for removal of internal fixation device: Secondary | ICD-10-CM

## 2010-12-26 DIAGNOSIS — M4714 Other spondylosis with myelopathy, thoracic region: Secondary | ICD-10-CM | POA: Diagnosis present

## 2010-12-26 DIAGNOSIS — I4891 Unspecified atrial fibrillation: Secondary | ICD-10-CM | POA: Diagnosis present

## 2010-12-26 DIAGNOSIS — G8220 Paraplegia, unspecified: Secondary | ICD-10-CM | POA: Diagnosis present

## 2010-12-26 DIAGNOSIS — T84498A Other mechanical complication of other internal orthopedic devices, implants and grafts, initial encounter: Principal | ICD-10-CM | POA: Diagnosis present

## 2010-12-26 DIAGNOSIS — Y832 Surgical operation with anastomosis, bypass or graft as the cause of abnormal reaction of the patient, or of later complication, without mention of misadventure at the time of the procedure: Secondary | ICD-10-CM | POA: Diagnosis present

## 2010-12-26 DIAGNOSIS — I10 Essential (primary) hypertension: Secondary | ICD-10-CM | POA: Diagnosis present

## 2010-12-26 HISTORY — DX: Essential (primary) hypertension: I10

## 2010-12-26 HISTORY — DX: Other chronic pain: G89.29

## 2010-12-26 HISTORY — DX: Dorsalgia, unspecified: M54.9

## 2010-12-26 HISTORY — DX: Unspecified atrial fibrillation: I48.91

## 2010-12-26 HISTORY — DX: Other specified postprocedural states: Z98.890

## 2010-12-26 HISTORY — DX: Presence of cardiac pacemaker: Z95.0

## 2010-12-26 LAB — DIFFERENTIAL
Basophils Absolute: 0 10*3/uL (ref 0.0–0.1)
Basophils Relative: 0 % (ref 0–1)
Eosinophils Absolute: 0 10*3/uL (ref 0.0–0.7)
Monocytes Relative: 5 % (ref 3–12)
Neutro Abs: 5.9 10*3/uL (ref 1.7–7.7)
Neutrophils Relative %: 79 % — ABNORMAL HIGH (ref 43–77)

## 2010-12-26 LAB — PROTIME-INR: Prothrombin Time: 13.5 seconds (ref 11.6–15.2)

## 2010-12-26 LAB — URINE MICROSCOPIC-ADD ON

## 2010-12-26 LAB — CBC
Hemoglobin: 10.8 g/dL — ABNORMAL LOW (ref 12.0–15.0)
MCH: 31.3 pg (ref 26.0–34.0)
Platelets: 276 10*3/uL (ref 150–400)
RBC: 3.45 MIL/uL — ABNORMAL LOW (ref 3.87–5.11)
WBC: 7.4 10*3/uL (ref 4.0–10.5)

## 2010-12-26 LAB — URINALYSIS, ROUTINE W REFLEX MICROSCOPIC
Glucose, UA: NEGATIVE mg/dL
Hgb urine dipstick: NEGATIVE
Protein, ur: NEGATIVE mg/dL
Specific Gravity, Urine: 1.018 (ref 1.005–1.030)
pH: 6 (ref 5.0–8.0)

## 2010-12-26 LAB — BASIC METABOLIC PANEL
CO2: 27 mEq/L (ref 19–32)
Calcium: 9.3 mg/dL (ref 8.4–10.5)
Chloride: 106 mEq/L (ref 96–112)
Glucose, Bld: 107 mg/dL — ABNORMAL HIGH (ref 70–99)
Potassium: 4.8 mEq/L (ref 3.5–5.1)
Sodium: 142 mEq/L (ref 135–145)

## 2010-12-26 LAB — SEDIMENTATION RATE: Sed Rate: 35 mm/hr — ABNORMAL HIGH (ref 0–22)

## 2010-12-27 ENCOUNTER — Inpatient Hospital Stay (HOSPITAL_COMMUNITY): Payer: Medicare Other

## 2010-12-27 LAB — MRSA PCR SCREENING: MRSA by PCR: NEGATIVE

## 2010-12-27 MED ORDER — IOHEXOL 300 MG/ML  SOLN
10.0000 mL | Freq: Once | INTRAMUSCULAR | Status: AC | PRN
  Administered 2010-12-27: 10 mL via INTRATHECAL

## 2010-12-28 NOTE — Consult Note (Signed)
  NAMEMarland [name]  APRILL, BANKO NO.:  [id number]  MEDICAL RECORD NO.:  [id number]  LOCATION:  3033                         FACILITY:  [facility]  PHYSICIAN:  Donalee Citrin, M.D.        DATE OF BIRTH:  1931-12-04  DATE OF CONSULTATION:  12/26/2010 DATE OF DISCHARGE:                                CONSULTATION   REASON FOR CONSULTATION:  Weakness in her legs, paraparesis and numbness.  HISTORY OF PRESENT ILLNESS:  The patient is a 77 year old female, well known to me, who has had multiple spinal operations, the most recent of which was a T9-T10 to L5 stabilization procedure, which she underwent for compression deformity and kyphosis above the level of her previous L1-L5 fusion.  The patient had been doing fairly well and then followed in the office postoperatively and progressed very well with significant pain improvement.  Followup scans showed mild compression deformity above the level of her fusion, but she had been tolerating that well. However, last Thursday, she had an episode where she had numbness that went down the tops of her thighs, actually had involved the entire leg and then progressed to where this morning her legs gave way on her and felt weak and she collapsed.  She continues to report numbness is over the entire legs or goes up to her waistline.  She denies any difficulty with bowel and bladder.  She does report weakness in her legs and difficulty getting around.  PAST MEDICAL HISTORY:  Fairly remarkable for: 1. Hypertension. 2. AFib. 3. Back pain.  She has had previous back surgery and as well as pacemaker.  She is a nonsmoker, nondrinker.  ALLERGIES:  Include PENICILLIN and AMLODIPINE.  CURRENT MEDICATIONS:  Include diclofenac, flecainide, Tribenzor, oxycodone, Soma.  PHYSICAL EXAMINATION:  GENERAL:  The patient is a pleasant 77 year old female, in no acute distress. HEENT:  Within normal limits. EXTREMITIES:  Lower extremity strength is 4+/5  proximally in her iliopsoas and 5/5 in her quads, 5/5 in her hamstrings, 5/5 in dorsiflexion and plantar flexion.  Sensation is diffusely decreased all the way with what appears to be a T8-9 sensory level.  ASSESSMENT:  A 77 year old female with multiple previous compression deformities in her spine as well as previous stabilization procedures who presents with acute onset of numbness in her legs and weakness.  We will work her up for progression of her compression fractures or possible burst fracture, especially above the level fusion with thoracic and lumbar CT scans.  She is not able to get an MRI scan secondary to her pacemaker, but we will obtain a CT and go from there.          ______________________________ Donalee Citrin, M.D.     GC/MEDQ  D:  12/26/2010  T:  12/27/2010  Job:  [id number]  Electronically Signed by Donalee Citrin M.D. on 12/28/2010 03:29:45 PM

## 2010-12-28 NOTE — Op Note (Signed)
NAMEMarland Kitchen  PARYS, ELENBAAS NO.:  [id number]  MEDICAL RECORD NO.:  [id number]  LOCATION:                                 FACILITY:  PHYSICIAN:  Donalee Citrin, M.D.        DATE OF BIRTH:  [birth year]  DATE OF PROCEDURE:  12/27/2010 DATE OF DISCHARGE:                              OPERATIVE REPORT   PREOPERATIVE DIAGNOSES: 1. Thoracic spinal stenosis with thoracic myelopathy at T7-T8. 2. Compression deformity at T8. 3. Failure of fusion at T8.  PROCEDURES:  Exploration of fusion, removal of hardware at T8, decompressive thoracic laminectomy at T7-T8, vertebroplasty bilateral at T7 and T8, placement of a medium Hemovac drain.  SURGEON:  Donalee Citrin, MD  ANESTHESIA:  General endotracheal.  HISTORY OF PRESENT ILLNESS:  The patient is a 77 year old female who presented through the emergency room with weakness in her legs and difficulty ambulating.  Initial workup with CT scan, subsequent myelography showed a complete CSF block at T7-T8 right above the level of the T8 screw.  It also showed some loosening of her T8 screws with some migration of fractures to the superior endplate of T8 consistent with a compression deformity and kyphotic deformity at T7-T8.  Because of this and the patient had very poor bone quality, osteoporosis and a progression on exam, the patient was recommended exploration of fusion and removal of the loose screws at T8 and decompressive laminectomy at T7 and T8, open vertebroplasty bilaterally at T7-T8 to add some additional anterior column support.  All this was recommended to the patient.  She understands and agrees to proceed forward.  DESCRIPTION OF PROCEDURE:  The patient was brought to the OR and was induced under general anesthesia, positioned prone on flat rolls.  Her back was prepped and draped in the usual sterile fashion.  Her old incision was opened up in the superior aspect of it extending slightly cephalad.  Subperiosteal dissection  was carried out, the pedicle screws were identified and noted to be loose.  The rod was then drilled and cut between the T8 and T9 pedicle screws using a titanium metal cutting drill bit.  Then, the nuts were removed and the screw was removed, both screws were markedly loose within the pedicle of T8.  The pedicles were explored and on the patient's left side, the pedicle screw had migrated into the disk space at T7-T8.  The pedicle on the right at T8 still appeared to be competent.  Decompressive laminotomies were begun and spinous processes at T7 and proximal aspect of T8 were then removed. Central decompression was initiated.  There was marked graft overgrowth on top of the lamina of T7-T8.  This was poorly organized, so this was bitten away with Leksell rongeur and using a 2-mm Kerrison punch marching down the gutters and decompression was begun.  Central decompression was completed.  There was marked inflammatory process and granulation tissue causing severe compression of the thoracic spinal cord at the level of the T8 pedicles.  This was all teased away and dissected off the dura with a dental dissect and removed in piecemeal fashion with 2-mm and 3-mm Kerrison punch decompressing the thoracic cord.  The  decompression was carried out above the T7 lamina down to below the T8 lamina to where the bottom below the inferior aspect of the T8 pedicle which was palpated with a nerve hook.  After adequate central decompression being completed, attention was taken to the vertebroplasty, so under AP and lateral fluoroscopy, pilot holes were drilled at T7, cannulated with a Jamshidi needle bilaterally.  Then, the Kyphon was mixed.  Kyphon was then inserted through the Jamshidi through the plunging sheath mechanism using fluoroscopy at each step along the way to confirm that the Kyphon material was staying within the vertebral body and not migrating.  After adequate amount of Kyphon material  had been placed in the T7 body using the open pedicle holes, some additional material was escorted through this.  Understanding that some of this would migrate into the disk space but felt this would also give her some anterior column support over that kyphotic level and so a small amount of __________ was injected through the left-sided as well as a right- sided T8 pedicles.  Again, the left-sided allowed some migration of the disk space, but again this was felt to be beneficial providing some anterior column support.  After both levels have been injected with Kyphon material, Jamshidi was removed.  Again, fluoroscopy was used all the way and postop fluoroscopy confirmed good position of the graft material.  Then, meticulous hemostasis was maintained.  A medium Hemovac drain was placed, and wound was closed in layers with interrupted Vicryl and the skin was closed with running 4-0 subcuticular.  Benzoin and Steri-Strips were applied.  The patient went to recovery room in stable condition.  At the end of the case, sponge and instrument counts were correct.          ______________________________ Donalee Citrin, M.D.     GC/MEDQ  D:  12/27/2010  T:  12/28/2010  Job:  [id number]  Electronically Signed by Donalee Citrin M.D. on 12/28/2010 03:29:50 PM

## 2010-12-29 ENCOUNTER — Inpatient Hospital Stay (HOSPITAL_COMMUNITY): Payer: Medicare Other

## 2011-01-09 NOTE — Discharge Summary (Signed)
  NAMEMarland Kitchen  KANCHAN, GAL NO.:  [id number]  MEDICAL RECORD NO.:  [id number]  LOCATION:                                 FACILITY:  PHYSICIAN:  Donalee Citrin, M.D.        DATE OF BIRTH:  September 09, 1931  DATE OF ADMISSION:  12/27/2010 DATE OF DISCHARGE:                              DISCHARGE SUMMARY   ADMITTING DIAGNOSIS:  Thoracic spinal stenosis at T7-T8 with thoracic myelopathy and progressive paraparesis.  HISTORY OF PRESENT ILLNESS:  The patient is a 77 year old female who was admitted from the emergency room with acute onset of weakness and numbness in her legs.  Workup revealed severe thoracic stenosis predominantly from posterior element compression at T7-8.  Subsequent myelography confirmed this to be at the site of severe stenosis as well as some loosening of her T8 screws, so the patient was recommended emergent decompression, procedure with removal of hardware at T8, and subsequent vertebroplasty.  So, the patient went to the myelography on first hospital day.  On the second hospital day, the patient was taken to the operating room, underwent the aforementioned procedure. Postoperatively, the patient did very well, had immediate relief of her leg pain, and progressively mobilized over the next several days with physical and occupational therapy.  Her Foley was able to be taken out. Her Hemovac drain was discontinued.  She was voiding spontaneously, ambulating with physical therapy, and stable enough to be discharged home on January 02, 2011 with home health physical therapy as well as a wheelchair and in a brace.  At the time of discharge, her pain was well controlled on pills.  She was tolerating a regular diet.          ______________________________ Donalee Citrin, M.D.     GC/MEDQ  D:  01/02/2011  T:  01/03/2011  Job:  [id number]  Electronically Signed by Donalee Citrin M.D. on 01/09/2011 03:19:55 PM

## 2011-02-07 ENCOUNTER — Other Ambulatory Visit: Payer: Self-pay | Admitting: Neurosurgery

## 2011-02-07 ENCOUNTER — Ambulatory Visit
Admission: RE | Admit: 2011-02-07 | Discharge: 2011-02-07 | Disposition: A | Discharge status: Home / Self Care | Payer: Medicare Other | Source: Ambulatory Visit | Attending: Neurosurgery | Admitting: Neurosurgery

## 2011-02-07 DIAGNOSIS — M4804 Spinal stenosis, thoracic region: Secondary | ICD-10-CM

## 2011-02-07 DIAGNOSIS — S22009A Unspecified fracture of unspecified thoracic vertebra, initial encounter for closed fracture: Secondary | ICD-10-CM

## 2011-03-01 ENCOUNTER — Other Ambulatory Visit: Payer: Self-pay | Admitting: Neurosurgery

## 2011-03-01 DIAGNOSIS — M549 Dorsalgia, unspecified: Secondary | ICD-10-CM

## 2011-03-25 ENCOUNTER — Emergency Department (HOSPITAL_COMMUNITY): Payer: Medicare Other

## 2011-03-25 ENCOUNTER — Encounter (HOSPITAL_COMMUNITY): Payer: Self-pay

## 2011-03-25 ENCOUNTER — Emergency Department (HOSPITAL_COMMUNITY)
Admission: EM | Admit: 2011-03-25 | Discharge: 2011-03-26 | Disposition: A | Discharge status: Home / Self Care | Payer: Medicare Other | Attending: Emergency Medicine | Admitting: Emergency Medicine

## 2011-03-25 DIAGNOSIS — Z79899 Other long term (current) drug therapy: Secondary | ICD-10-CM | POA: Insufficient documentation

## 2011-03-25 DIAGNOSIS — I4891 Unspecified atrial fibrillation: Secondary | ICD-10-CM | POA: Diagnosis not present

## 2011-03-25 DIAGNOSIS — R5383 Other fatigue: Secondary | ICD-10-CM | POA: Insufficient documentation

## 2011-03-25 DIAGNOSIS — R5381 Other malaise: Secondary | ICD-10-CM | POA: Insufficient documentation

## 2011-03-25 DIAGNOSIS — I10 Essential (primary) hypertension: Secondary | ICD-10-CM | POA: Diagnosis not present

## 2011-03-25 DIAGNOSIS — M546 Pain in thoracic spine: Secondary | ICD-10-CM | POA: Diagnosis not present

## 2011-03-25 DIAGNOSIS — Z7982 Long term (current) use of aspirin: Secondary | ICD-10-CM | POA: Diagnosis not present

## 2011-03-25 DIAGNOSIS — Z950 Presence of cardiac pacemaker: Secondary | ICD-10-CM | POA: Diagnosis not present

## 2011-03-25 DIAGNOSIS — M549 Dorsalgia, unspecified: Secondary | ICD-10-CM

## 2011-03-25 DIAGNOSIS — M8448XA Pathological fracture, other site, initial encounter for fracture: Secondary | ICD-10-CM | POA: Diagnosis not present

## 2011-03-25 DIAGNOSIS — G8929 Other chronic pain: Secondary | ICD-10-CM | POA: Insufficient documentation

## 2011-03-25 MED ORDER — HYDROMORPHONE HCL PF 1 MG/ML IJ SOLN
1.0000 mg | Freq: Once | INTRAMUSCULAR | Status: AC
  Administered 2011-03-25: 1 mg via INTRAMUSCULAR
  Filled 2011-03-25: qty 1

## 2011-03-25 MED ORDER — DIAZEPAM 5 MG/ML IJ SOLN
5.0000 mg | Freq: Once | INTRAMUSCULAR | Status: AC
  Administered 2011-03-25: 10 mg via INTRAMUSCULAR

## 2011-03-25 MED ORDER — DIAZEPAM 5 MG/ML IJ SOLN
5.0000 mg | Freq: Once | INTRAMUSCULAR | Status: DC
  Filled 2011-03-25: qty 2

## 2011-03-25 NOTE — ED Notes (Signed)
Hx of 4 back surgeries, last one Oct 2012, wearing a vertalign support, today back pain so bad she vomited, percocet 10/325, Tresa Garter, Dr Glee Arvin surgeon

## 2011-03-25 NOTE — ED Provider Notes (Signed)
History     CSN: [id number]  Arrival date & time 03/25/11  1729   First MD Initiated Contact with Patient 03/25/11 2143      Chief Complaint  Patient presents with  . Back Pain    (Consider location/radiation/quality/duration/timing/severity/associated sxs/prior treatment) HPI Comments: Patient here with worsening mid left sided back pain over the past 2 weeks - she states she has an appointment with Dr. Wynetta Emery on the 15th but the pain has gotten so worse that she cannot wait to see him.  She reports a history of 4 back surgeries - states the last one was in October where she had a revision of her T8 surgery and spinal stenosis above that level where they did emergency decompression surgery - she states she still has residual weakness from that but no worsening of this.  She also denies numbness, tingling, loss of control of bowel or bladder - states pain is worse with movement - reports pain worsened today to the point that she had to vomit so she decided to come here.  Patient is a 78 y.o. [name] presenting with back pain. The history is provided by the patient and the spouse. No language interpreter was used.  Back Pain  This is a recurrent problem. The current episode started more than 1 week ago. The problem occurs constantly. The problem has been gradually worsening. The pain is associated with no known injury. The pain is present in the thoracic spine. The quality of the pain is described as stabbing and shooting. The pain does not radiate. The pain is at a severity of 10/10. The pain is severe. The symptoms are aggravated by twisting and certain positions. The pain is the same all the time. Stiffness is present in the morning. Associated symptoms include weakness. Pertinent negatives include no chest pain, no fever, no numbness, no weight loss, no headaches, no abdominal pain, no abdominal swelling, no bowel incontinence, no perianal numbness, no bladder incontinence, no dysuria, no pelvic  pain, no leg pain, no paresthesias, no paresis and no tingling. She has tried analgesics and muscle relaxants for the symptoms. The treatment provided moderate relief.    Past Medical History  Diagnosis Date  . Hypertension   . A-fib   . Chronic back pain   . Pacemaker   . Status post lumbar surgery     History reviewed. No pertinent past surgical history.  History reviewed. No pertinent family history.  History  Substance Use Topics  . Smoking status: Never Smoker   . Smokeless tobacco: Not on file  . Alcohol Use: No    OB History    Grav Para Term Preterm Abortions TAB SAB Ect Mult Living                  Review of Systems  Constitutional: Negative for fever and weight loss.  Cardiovascular: Negative for chest pain.  Gastrointestinal: Negative for abdominal pain and bowel incontinence.  Genitourinary: Negative for bladder incontinence, dysuria and pelvic pain.  Musculoskeletal: Positive for back pain.  Neurological: Positive for weakness. Negative for tingling, numbness, headaches and paresthesias.  All other systems reviewed and are negative.    Allergies  Amlodipine and Penicillins  Home Medications   Current Outpatient Rx  Name Route Sig Dispense Refill  . ASPIRIN EC 81 MG PO TBEC Oral Take 81 mg by mouth at bedtime.      Marland Kitchen CARISOPRODOL 350 MG PO TABS Oral Take 350 mg by mouth 4 (four) times daily  as needed. For muscle spasm     . DICLOFENAC SODIUM 50 MG PO TBEC Oral Take 50 mg by mouth 2 (two) times daily.      Marland Kitchen DOCUSATE SODIUM 100 MG PO CAPS Oral Take 100 mg by mouth at bedtime.      Marland Kitchen FLECAINIDE ACETATE 100 MG PO TABS Oral Take 50-100 mg by mouth 2 (two) times daily. Take 1/2 tab in the am & 1 tab in the pm     . OLMESARTAN-AMLODIPINE-HCTZ 40-10-25 MG PO TABS Oral Take 1 tablet by mouth daily.      Marland Kitchen OVER THE COUNTER MEDICATION Oral Take 1 tablet by mouth every morning. Medication:Vitamin C/ with Vitamin D supplement     . OXYCODONE-ACETAMINOPHEN 10-325  MG PO TABS Oral Take 1-2 tablets by mouth every 4 (four) hours as needed. For pain     . FAMOTIDINE 20 MG PO TABS Oral Take 20 mg by mouth 2 (two) times daily.        BP 152/53  Pulse 68  Temp(Src) 98.8 F (37.1 C) (Oral)  Resp 20  SpO2 100%  Physical Exam  Nursing note and vitals reviewed. Constitutional: She is oriented to person, place, and time. She appears well-developed and well-nourished. No distress.       Sitting in vertalign back brace  HENT:  Head: Normocephalic and atraumatic.  Right Ear: External ear normal.  Left Ear: External ear normal.  Nose: Nose normal.  Mouth/Throat: Oropharynx is clear and moist. No oropharyngeal exudate.  Eyes: Conjunctivae are normal. Pupils are equal, round, and reactive to light. No scleral icterus.  Neck: Normal range of motion. Neck supple. No spinous process tenderness and no muscular tenderness present.  Cardiovascular: Normal rate, regular rhythm and normal heart sounds.  Exam reveals no gallop and no friction rub.   No murmur heard. Pulmonary/Chest: Effort normal and breath sounds normal. No respiratory distress. She exhibits no tenderness.  Abdominal: Soft. Bowel sounds are normal. She exhibits no distension. There is no tenderness.  Musculoskeletal:       Thoracic back: She exhibits decreased range of motion and tenderness.       Back:       Well healed midline incision - no rash noted - pain with palpation of left paraspinal muscles.  Lymphadenopathy:    She has no cervical adenopathy.  Neurological: She is alert and oriented to person, place, and time. No cranial nerve deficit. She exhibits normal muscle tone. Coordination normal.  Skin: Skin is warm and dry.  Psychiatric: She has a normal mood and affect. Her behavior is normal. Judgment and thought content normal.    ED Course  Procedures (including critical care time)  Labs Reviewed - No data to display No results found.  Results for orders placed during the hospital  encounter of 12/26/10  DIFFERENTIAL      Component Value Range   Neutrophils Relative 79 (*) 43 - 77 (%)   Neutro Abs 5.9  1.7 - 7.7 (K/uL)   Lymphocytes Relative 15  12 - 46 (%)   Lymphs Abs 1.1  0.7 - 4.0 (K/uL)   Monocytes Relative 5  3 - 12 (%)   Monocytes Absolute 0.4  0.1 - 1.0 (K/uL)   Eosinophils Relative 1  0 - 5 (%)   Eosinophils Absolute 0.0  0.0 - 0.7 (K/uL)   Basophils Relative 0  0 - 1 (%)   Basophils Absolute 0.0  0.0 - 0.1 (K/uL)  CBC  Component Value Range   WBC 7.4  4.0 - 10.5 (K/uL)   RBC 3.45 (*) 3.87 - 5.11 (MIL/uL)   Hemoglobin 10.8 (*) 12.0 - 15.0 (g/dL)   HCT 16.1 (*) 09.6 - 46.0 (%)   MCV 96.8  78.0 - 100.0 (fL)   MCH 31.3  26.0 - 34.0 (pg)   MCHC 32.3  30.0 - 36.0 (g/dL)   RDW 04.5  40.9 - 81.1 (%)   Platelets 276  150 - 400 (K/uL)  BASIC METABOLIC PANEL      Component Value Range   Sodium 142  135 - 145 (mEq/L)   Potassium 4.8  3.5 - 5.1 (mEq/L)   Chloride 106  96 - 112 (mEq/L)   CO2 27  19 - 32 (mEq/L)   Glucose, Bld 107 (*) 70 - 99 (mg/dL)   BUN 30 (*) 6 - 23 (mg/dL)   Creatinine, Ser 9.14 (*) 0.50 - 1.10 (mg/dL)   Calcium 9.3  8.4 - 78.2 (mg/dL)   GFR calc non Af Amer 31 (*) >90 (mL/min)   GFR calc Af Amer 36 (*) >90 (mL/min)  URINALYSIS, ROUTINE W REFLEX MICROSCOPIC      Component Value Range   Color, Urine YELLOW  YELLOW    APPearance CLEAR  CLEAR    Specific Gravity, Urine 1.018  1.005 - 1.030    pH 6.0  5.0 - 8.0    Glucose, UA NEGATIVE  NEGATIVE (mg/dL)   Hgb urine dipstick NEGATIVE  NEGATIVE    Bilirubin Urine NEGATIVE  NEGATIVE    Ketones, ur NEGATIVE  NEGATIVE (mg/dL)   Protein, ur NEGATIVE  NEGATIVE (mg/dL)   Urobilinogen, UA 0.2  0.0 - 1.0 (mg/dL)   Nitrite NEGATIVE  NEGATIVE    Leukocytes, UA SMALL (*) NEGATIVE   URINE MICROSCOPIC-ADD ON      Component Value Range   Squamous Epithelial / LPF RARE  RARE    WBC, UA 3-6  <3 (WBC/hpf)   RBC / HPF 0-2  <3 (RBC/hpf)   Bacteria, UA RARE  RARE   PROTIME-INR      Component  Value Range   Prothrombin Time 13.5  11.6 - 15.2 (seconds)   INR 1.01  0.00 - 1.49   APTT      Component Value Range   aPTT 32  24 - 37 (seconds)  SEDIMENTATION RATE      Component Value Range   Sed Rate 35 (*) 0 - 22 (mm/hr)  MRSA PCR SCREENING      Component Value Range   MRSA by PCR NEGATIVE  NEGATIVE    No results found.   Mid back pain    MDM  I spoke with Dr. Newell Coral about the preliminary CT report which noted at that time a new 6mm retrolithesis of T8 in relation to T7 - he compared this to the November x-ray report and states that he did not really notice a change since then - he then spoke with Dr. Si Gaul who has since changed the report again noting no change since the previous report - that having been said and as there is no functional issue with the patient, he advises to prescribe stronger pain medication and then she should follow up with Dr. Wynetta Emery on Monday - the patient is amenable to this plan.        Izola Price [place], [place] 03/26/11 0300  Izola Price [place], [place] 03/26/11 [phone]

## 2011-03-26 MED ORDER — HYDROMORPHONE HCL PF 1 MG/ML IJ SOLN
1.0000 mg | Freq: Once | INTRAMUSCULAR | Status: AC
  Administered 2011-03-26: 1 mg via INTRAMUSCULAR
  Filled 2011-03-26: qty 1

## 2011-03-26 MED ORDER — DIAZEPAM 5 MG/ML IJ SOLN
5.0000 mg | Freq: Once | INTRAMUSCULAR | Status: AC
  Administered 2011-03-26: 5 mg via INTRAMUSCULAR
  Filled 2011-03-26: qty 2

## 2011-03-26 MED ORDER — HYDROMORPHONE HCL 2 MG PO TABS: 2.0000 mg | ORAL_TABLET | ORAL | Status: DC | PRN

## 2011-03-26 MED ORDER — DIAZEPAM 5 MG PO TABS: 5.0000 mg | ORAL_TABLET | Freq: Two times a day (BID) | ORAL | Status: DC

## 2011-03-26 NOTE — ED Provider Notes (Signed)
Medical screening examination/treatment/procedure(s) were performed by non-physician practitioner and as supervising physician I was immediately available for consultation/collaboration.   Donnetta Hutching, MD 03/26/11 1524

## 2011-04-04 ENCOUNTER — Ambulatory Visit
Admission: RE | Admit: 2011-04-04 | Discharge: 2011-04-04 | Disposition: A | Discharge status: Home / Self Care | Payer: Medicare Other | Source: Ambulatory Visit | Attending: Neurosurgery | Admitting: Neurosurgery

## 2011-04-04 DIAGNOSIS — S22009A Unspecified fracture of unspecified thoracic vertebra, initial encounter for closed fracture: Secondary | ICD-10-CM | POA: Diagnosis not present

## 2011-04-04 DIAGNOSIS — M549 Dorsalgia, unspecified: Secondary | ICD-10-CM | POA: Diagnosis not present

## 2011-04-04 DIAGNOSIS — M8448XA Pathological fracture, other site, initial encounter for fracture: Secondary | ICD-10-CM | POA: Diagnosis not present

## 2011-04-04 DIAGNOSIS — M519 Unspecified thoracic, thoracolumbar and lumbosacral intervertebral disc disorder: Secondary | ICD-10-CM | POA: Diagnosis not present

## 2011-04-04 DIAGNOSIS — M545 Low back pain: Secondary | ICD-10-CM | POA: Diagnosis not present

## 2011-04-05 ENCOUNTER — Encounter (HOSPITAL_COMMUNITY): Payer: Self-pay | Admitting: Pharmacy Technician

## 2011-04-06 ENCOUNTER — Encounter (HOSPITAL_COMMUNITY): Payer: Self-pay

## 2011-04-06 NOTE — Progress Notes (Signed)
Contacted [employer], notified of patients surgery for 04/07/11, representative, [employer].  Contacted Dr. Vanetta Shawl office, patients cardiologist, faxed prescription for implanted cardiac device, requested last office visit notes, EKG, and available cardiac test.

## 2011-04-07 ENCOUNTER — Encounter (HOSPITAL_COMMUNITY)
Admission: RE | Disposition: A | Discharge status: Home / Self Care | Payer: Self-pay | Source: Ambulatory Visit | Attending: Neurosurgery

## 2011-04-07 ENCOUNTER — Inpatient Hospital Stay (HOSPITAL_COMMUNITY): Payer: Medicare Other

## 2011-04-07 ENCOUNTER — Encounter (HOSPITAL_COMMUNITY): Payer: Self-pay | Admitting: Surgery

## 2011-04-07 ENCOUNTER — Encounter (HOSPITAL_COMMUNITY): Payer: Self-pay | Admitting: Certified Registered"

## 2011-04-07 ENCOUNTER — Inpatient Hospital Stay (HOSPITAL_COMMUNITY)
Admission: RE | Admit: 2011-04-07 | Discharge: 2011-04-15 | DRG: 457 | Disposition: A | Discharge status: Home / Self Care | Payer: Medicare Other | Source: Ambulatory Visit | Attending: Neurosurgery | Admitting: Neurosurgery

## 2011-04-07 ENCOUNTER — Inpatient Hospital Stay (HOSPITAL_COMMUNITY): Payer: Medicare Other | Admitting: Certified Registered"

## 2011-04-07 DIAGNOSIS — Z09 Encounter for follow-up examination after completed treatment for conditions other than malignant neoplasm: Secondary | ICD-10-CM | POA: Diagnosis not present

## 2011-04-07 DIAGNOSIS — Z79899 Other long term (current) drug therapy: Secondary | ICD-10-CM | POA: Diagnosis not present

## 2011-04-07 DIAGNOSIS — Z880 Allergy status to penicillin: Secondary | ICD-10-CM

## 2011-04-07 DIAGNOSIS — Z950 Presence of cardiac pacemaker: Secondary | ICD-10-CM

## 2011-04-07 DIAGNOSIS — M8448XA Pathological fracture, other site, initial encounter for fracture: Secondary | ICD-10-CM | POA: Diagnosis not present

## 2011-04-07 DIAGNOSIS — Z7982 Long term (current) use of aspirin: Secondary | ICD-10-CM | POA: Diagnosis not present

## 2011-04-07 DIAGNOSIS — M533 Sacrococcygeal disorders, not elsewhere classified: Secondary | ICD-10-CM | POA: Diagnosis not present

## 2011-04-07 DIAGNOSIS — I10 Essential (primary) hypertension: Secondary | ICD-10-CM | POA: Diagnosis not present

## 2011-04-07 DIAGNOSIS — T84029A Dislocation of unspecified internal joint prosthesis, initial encounter: Secondary | ICD-10-CM | POA: Diagnosis not present

## 2011-04-07 DIAGNOSIS — Z981 Arthrodesis status: Secondary | ICD-10-CM | POA: Diagnosis not present

## 2011-04-07 DIAGNOSIS — M40299 Other kyphosis, site unspecified: Secondary | ICD-10-CM | POA: Diagnosis not present

## 2011-04-07 DIAGNOSIS — M2489 Other specific joint derangement of other specified joint, not elsewhere classified: Secondary | ICD-10-CM | POA: Diagnosis not present

## 2011-04-07 DIAGNOSIS — R918 Other nonspecific abnormal finding of lung field: Secondary | ICD-10-CM | POA: Diagnosis not present

## 2011-04-07 DIAGNOSIS — A0472 Enterocolitis due to Clostridium difficile, not specified as recurrent: Secondary | ICD-10-CM | POA: Diagnosis not present

## 2011-04-07 DIAGNOSIS — M4000 Postural kyphosis, site unspecified: Principal | ICD-10-CM | POA: Diagnosis present

## 2011-04-07 DIAGNOSIS — I4891 Unspecified atrial fibrillation: Secondary | ICD-10-CM | POA: Diagnosis present

## 2011-04-07 DIAGNOSIS — G8929 Other chronic pain: Secondary | ICD-10-CM | POA: Diagnosis present

## 2011-04-07 DIAGNOSIS — M961 Postlaminectomy syndrome, not elsewhere classified: Secondary | ICD-10-CM | POA: Diagnosis not present

## 2011-04-07 DIAGNOSIS — J9819 Other pulmonary collapse: Secondary | ICD-10-CM | POA: Diagnosis not present

## 2011-04-07 DIAGNOSIS — Q762 Congenital spondylolisthesis: Secondary | ICD-10-CM | POA: Diagnosis not present

## 2011-04-07 DIAGNOSIS — M79609 Pain in unspecified limb: Secondary | ICD-10-CM | POA: Diagnosis not present

## 2011-04-07 HISTORY — DX: Reserved for inherently not codable concepts without codable children: IMO0001

## 2011-04-07 HISTORY — DX: Anemia, unspecified: D64.9

## 2011-04-07 HISTORY — DX: Encounter for other specified aftercare: Z51.89

## 2011-04-07 HISTORY — DX: Unspecified osteoarthritis, unspecified site: M19.90

## 2011-04-07 LAB — CBC
HCT: 31 % — ABNORMAL LOW (ref 36.0–46.0)
MCV: 94.2 fL (ref 78.0–100.0)
Platelets: 239 10*3/uL (ref 150–400)
RBC: 3.29 MIL/uL — ABNORMAL LOW (ref 3.87–5.11)
WBC: 5.1 10*3/uL (ref 4.0–10.5)

## 2011-04-07 LAB — BASIC METABOLIC PANEL
CO2: 26 mEq/L (ref 19–32)
Chloride: 105 mEq/L (ref 96–112)
Sodium: 142 mEq/L (ref 135–145)

## 2011-04-07 LAB — SURGICAL PCR SCREEN: MRSA, PCR: NEGATIVE

## 2011-04-07 LAB — TYPE AND SCREEN

## 2011-04-07 SURGERY — POSTERIOR LUMBAR FUSION 1 LEVEL
Anesthesia: General | Site: Back | Wound class: Clean

## 2011-04-07 MED ORDER — LACTATED RINGERS IV SOLN
INTRAVENOUS | Status: DC | PRN
  Administered 2011-04-07 (×3): via INTRAVENOUS

## 2011-04-07 MED ORDER — ASPIRIN EC 81 MG PO TBEC
81.0000 mg | DELAYED_RELEASE_TABLET | Freq: Every day | ORAL | Status: DC
  Administered 2011-04-07 – 2011-04-14 (×8): 81 mg via ORAL
  Filled 2011-04-07 (×9): qty 1

## 2011-04-07 MED ORDER — DICLOFENAC SODIUM 50 MG PO TBEC
50.0000 mg | DELAYED_RELEASE_TABLET | Freq: Two times a day (BID) | ORAL | Status: DC
  Administered 2011-04-07 – 2011-04-15 (×15): 50 mg via ORAL
  Filled 2011-04-07 (×17): qty 1

## 2011-04-07 MED ORDER — PHENYLEPHRINE HCL 10 MG/ML IJ SOLN
10.0000 mg | INTRAVENOUS | Status: DC | PRN
  Administered 2011-04-07: 1 ug/min via INTRAVENOUS

## 2011-04-07 MED ORDER — PANTOPRAZOLE SODIUM 40 MG IV SOLR
40.0000 mg | Freq: Every day | INTRAVENOUS | Status: DC
  Administered 2011-04-07 – 2011-04-08 (×2): 40 mg via INTRAVENOUS
  Filled 2011-04-07 (×3): qty 40

## 2011-04-07 MED ORDER — GLYCOPYRROLATE 0.2 MG/ML IJ SOLN
INTRAMUSCULAR | Status: DC | PRN
  Administered 2011-04-07: .7 mg via INTRAVENOUS

## 2011-04-07 MED ORDER — MIDAZOLAM HCL 5 MG/5ML IJ SOLN
INTRAMUSCULAR | Status: DC | PRN
  Administered 2011-04-07: 2 mg via INTRAVENOUS

## 2011-04-07 MED ORDER — ONDANSETRON HCL 4 MG/2ML IJ SOLN
INTRAMUSCULAR | Status: DC | PRN
  Administered 2011-04-07: 4 mg via INTRAVENOUS

## 2011-04-07 MED ORDER — ROCURONIUM BROMIDE 100 MG/10ML IV SOLN
INTRAVENOUS | Status: DC | PRN
  Administered 2011-04-07: 10 mg via INTRAVENOUS
  Administered 2011-04-07 (×2): 50 mg via INTRAVENOUS
  Administered 2011-04-07 (×2): 10 mg via INTRAVENOUS

## 2011-04-07 MED ORDER — THROMBIN 20000 UNITS EX KIT
PACK | CUTANEOUS | Status: DC | PRN
  Administered 2011-04-07: 20000 [IU] via TOPICAL

## 2011-04-07 MED ORDER — FLECAINIDE ACETATE 50 MG PO TABS
50.0000 mg | ORAL_TABLET | Freq: Every day | ORAL | Status: DC
  Administered 2011-04-08 – 2011-04-15 (×7): 50 mg via ORAL
  Filled 2011-04-07 (×8): qty 1

## 2011-04-07 MED ORDER — OLMESARTAN-AMLODIPINE-HCTZ 40-10-25 MG PO TABS
1.0000 | ORAL_TABLET | Freq: Every day | ORAL | Status: DC
  Filled 2011-04-07: qty 1

## 2011-04-07 MED ORDER — SODIUM CHLORIDE 0.9 % IR SOLN
Status: DC | PRN
  Administered 2011-04-07: 12:00:00

## 2011-04-07 MED ORDER — ACETAMINOPHEN 325 MG PO TABS: 650.0000 mg | ORAL_TABLET | ORAL | Status: DC | PRN

## 2011-04-07 MED ORDER — FAMOTIDINE 20 MG PO TABS
20.0000 mg | ORAL_TABLET | Freq: Two times a day (BID) | ORAL | Status: DC
  Administered 2011-04-07 – 2011-04-15 (×15): 20 mg via ORAL
  Filled 2011-04-07 (×17): qty 1

## 2011-04-07 MED ORDER — SODIUM CHLORIDE 0.9 % IJ SOLN
3.0000 mL | Freq: Two times a day (BID) | INTRAMUSCULAR | Status: DC
  Administered 2011-04-07 – 2011-04-08 (×3): 3 mL via INTRAVENOUS
  Administered 2011-04-09: 10 mL via INTRAVENOUS
  Administered 2011-04-09 – 2011-04-13 (×5): 3 mL via INTRAVENOUS

## 2011-04-07 MED ORDER — MUPIROCIN 2 % EX OINT
TOPICAL_OINTMENT | CUTANEOUS | Status: AC
  Filled 2011-04-07: qty 22

## 2011-04-07 MED ORDER — SODIUM CHLORIDE 0.45 % IV SOLN
INTRAVENOUS | Status: DC
  Administered 2011-04-07 – 2011-04-08 (×3): via INTRAVENOUS

## 2011-04-07 MED ORDER — MUPIROCIN 2 % EX OINT
TOPICAL_OINTMENT | Freq: Two times a day (BID) | CUTANEOUS | Status: DC
  Filled 2011-04-07: qty 22

## 2011-04-07 MED ORDER — ONDANSETRON HCL 4 MG/2ML IJ SOLN: 4.0000 mg | INTRAMUSCULAR | Status: DC | PRN

## 2011-04-07 MED ORDER — CEFAZOLIN SODIUM 1-5 GM-% IV SOLN: 1.0000 g | Freq: Three times a day (TID) | INTRAVENOUS | Status: DC

## 2011-04-07 MED ORDER — PHENOL 1.4 % MT LIQD
1.0000 | OROMUCOSAL | Status: DC | PRN
  Administered 2011-04-07: 1 via OROMUCOSAL
  Filled 2011-04-07: qty 177

## 2011-04-07 MED ORDER — HYDROMORPHONE HCL PF 1 MG/ML IJ SOLN
INTRAMUSCULAR | Status: AC
  Administered 2011-04-07: 1 mg via INTRAVENOUS
  Filled 2011-04-07: qty 1

## 2011-04-07 MED ORDER — DOCUSATE SODIUM 100 MG PO CAPS
100.0000 mg | ORAL_CAPSULE | Freq: Every day | ORAL | Status: DC
  Administered 2011-04-07 – 2011-04-10 (×4): 100 mg via ORAL
  Filled 2011-04-07 (×5): qty 1

## 2011-04-07 MED ORDER — ONDANSETRON HCL 4 MG/2ML IJ SOLN: 4.0000 mg | Freq: Once | INTRAMUSCULAR | Status: DC | PRN

## 2011-04-07 MED ORDER — HYDROMORPHONE HCL PF 1 MG/ML IJ SOLN
0.5000 mg | INTRAMUSCULAR | Status: DC | PRN
  Administered 2011-04-07 – 2011-04-09 (×8): 1 mg via INTRAVENOUS
  Filled 2011-04-07 (×7): qty 1

## 2011-04-07 MED ORDER — FENTANYL CITRATE 0.05 MG/ML IJ SOLN
INTRAMUSCULAR | Status: DC | PRN
  Administered 2011-04-07 (×2): 50 ug via INTRAVENOUS
  Administered 2011-04-07: 100 ug via INTRAVENOUS
  Administered 2011-04-07: 50 ug via INTRAVENOUS

## 2011-04-07 MED ORDER — CYCLOBENZAPRINE HCL 10 MG PO TABS
10.0000 mg | ORAL_TABLET | Freq: Three times a day (TID) | ORAL | Status: DC | PRN
  Administered 2011-04-07 – 2011-04-14 (×7): 10 mg via ORAL
  Filled 2011-04-07 (×8): qty 1

## 2011-04-07 MED ORDER — SODIUM CHLORIDE 0.9 % IV SOLN
INTRAVENOUS | Status: DC | PRN
  Administered 2011-04-07: 14:00:00 via INTRAVENOUS

## 2011-04-07 MED ORDER — NEOSTIGMINE METHYLSULFATE 1 MG/ML IJ SOLN
INTRAMUSCULAR | Status: DC | PRN
  Administered 2011-04-07: 3.5 mg via INTRAVENOUS

## 2011-04-07 MED ORDER — FLUMAZENIL 0.5 MG/5ML IV SOLN
INTRAVENOUS | Status: AC
  Filled 2011-04-07: qty 5

## 2011-04-07 MED ORDER — PROPOFOL 10 MG/ML IV EMUL
INTRAVENOUS | Status: DC | PRN
  Administered 2011-04-07: 80 mg via INTRAVENOUS

## 2011-04-07 MED ORDER — FLECAINIDE ACETATE 100 MG PO TABS
100.0000 mg | ORAL_TABLET | Freq: Every day | ORAL | Status: DC
  Administered 2011-04-07 – 2011-04-14 (×8): 100 mg via ORAL
  Filled 2011-04-07 (×9): qty 1

## 2011-04-07 MED ORDER — MENTHOL 3 MG MT LOZG: 1.0000 | LOZENGE | OROMUCOSAL | Status: DC | PRN

## 2011-04-07 MED ORDER — SODIUM CHLORIDE 0.9 % IV SOLN
INTRAVENOUS | Status: AC
  Filled 2011-04-07: qty 500

## 2011-04-07 MED ORDER — OLMESARTAN-AMLODIPINE-HCTZ 40-10-25 MG PO TABS
1.0000 | ORAL_TABLET | Freq: Every day | ORAL | Status: DC
  Administered 2011-04-09 – 2011-04-15 (×7): 1 via ORAL
  Filled 2011-04-07 (×8): qty 1

## 2011-04-07 MED ORDER — MUPIROCIN 2 % EX OINT
TOPICAL_OINTMENT | Freq: Two times a day (BID) | CUTANEOUS | Status: DC
  Administered 2011-04-07: 1 via NASAL
  Administered 2011-04-08 (×2): via NASAL
  Administered 2011-04-08: 1 via NASAL
  Administered 2011-04-09 – 2011-04-15 (×10): via NASAL
  Filled 2011-04-07 (×2): qty 22

## 2011-04-07 MED ORDER — OLMESARTAN-AMLODIPINE-HCTZ 40-10-25 MG PO TABS: 1.0000 | ORAL_TABLET | Freq: Every day | ORAL | Status: DC

## 2011-04-07 MED ORDER — ACETAMINOPHEN 650 MG RE SUPP: 650.0000 mg | RECTAL | Status: DC | PRN

## 2011-04-07 MED ORDER — LIDOCAINE-EPINEPHRINE 1 %-1:100000 IJ SOLN
INTRAMUSCULAR | Status: DC | PRN
  Administered 2011-04-07: 10 mL

## 2011-04-07 MED ORDER — VANCOMYCIN HCL IN DEXTROSE 1-5 GM/200ML-% IV SOLN
INTRAVENOUS | Status: AC
  Administered 2011-04-07: 1000 mg via INTRAVENOUS
  Filled 2011-04-07: qty 200

## 2011-04-07 MED ORDER — HYDROMORPHONE HCL PF 1 MG/ML IJ SOLN
0.2500 mg | INTRAMUSCULAR | Status: DC | PRN
  Administered 2011-04-07: 0.25 mg via INTRAVENOUS

## 2011-04-07 MED ORDER — FLECAINIDE ACETATE 50 MG PO TABS: 50.0000 mg | ORAL_TABLET | Freq: Two times a day (BID) | ORAL | Status: DC

## 2011-04-07 MED ORDER — HYDROMORPHONE HCL PF 1 MG/ML IJ SOLN
INTRAMUSCULAR | Status: AC
  Filled 2011-04-07: qty 1

## 2011-04-07 MED ORDER — CARISOPRODOL 350 MG PO TABS
350.0000 mg | ORAL_TABLET | Freq: Four times a day (QID) | ORAL | Status: DC | PRN
  Administered 2011-04-09 – 2011-04-13 (×11): 350 mg via ORAL
  Filled 2011-04-07 (×11): qty 1

## 2011-04-07 MED ORDER — HYDROMORPHONE HCL 2 MG PO TABS
2.0000 mg | ORAL_TABLET | ORAL | Status: DC | PRN
  Administered 2011-04-07 – 2011-04-12 (×19): 4 mg via ORAL
  Administered 2011-04-13: 2 mg via ORAL
  Administered 2011-04-13 – 2011-04-15 (×8): 4 mg via ORAL
  Filled 2011-04-07 (×8): qty 2
  Filled 2011-04-07: qty 1
  Filled 2011-04-07 (×19): qty 2

## 2011-04-07 MED ORDER — 0.9 % SODIUM CHLORIDE (POUR BTL) OPTIME
TOPICAL | Status: DC | PRN
  Administered 2011-04-07: 1000 mL

## 2011-04-07 MED ORDER — HEMOSTATIC AGENTS (NO CHARGE) OPTIME
TOPICAL | Status: DC | PRN
  Administered 2011-04-07: 1 via TOPICAL

## 2011-04-07 MED ORDER — VANCOMYCIN HCL IN DEXTROSE 1-5 GM/200ML-% IV SOLN
1000.0000 mg | Freq: Two times a day (BID) | INTRAVENOUS | Status: AC
  Administered 2011-04-07 – 2011-04-08 (×2): 1000 mg via INTRAVENOUS
  Filled 2011-04-07 (×2): qty 200

## 2011-04-07 MED ORDER — BACITRACIN 50000 UNITS IM SOLR
INTRAMUSCULAR | Status: AC
  Filled 2011-04-07: qty 1

## 2011-04-07 SURGICAL SUPPLY — 75 items
4.5 x 40 screw ×5 IMPLANT
ADH SKN CLS APL DERMABOND .7 (GAUZE/BANDAGES/DRESSINGS) ×1
APL SKNCLS STERI-STRIP NONHPOA (GAUZE/BANDAGES/DRESSINGS) ×1
BAG DECANTER FOR FLEXI CONT (MISCELLANEOUS) ×2 IMPLANT
BENZOIN TINCTURE PRP APPL 2/3 (GAUZE/BANDAGES/DRESSINGS) ×2 IMPLANT
BLADE SURG 11 STRL SS (BLADE) ×2 IMPLANT
BLADE SURG ROTATE 9660 (MISCELLANEOUS) IMPLANT
BRUSH SCRUB EZ PLAIN DRY (MISCELLANEOUS) ×2 IMPLANT
BUR MATCHSTICK NEURO 3.0 LAGG (BURR) ×2 IMPLANT
BUR PRECISION FLUTE 6.0 (BURR) ×2 IMPLANT
CANISTER SUCTION 2500CC (MISCELLANEOUS) ×2 IMPLANT
CAP REVERE LOCKING (Cap) ×5 IMPLANT
CLOTH BEACON ORANGE TIMEOUT ST (SAFETY) ×2 IMPLANT
CONNECTOR REVERE 6.35 29-33MM (Connector) ×1 IMPLANT
CONT SPEC 4OZ CLIKSEAL STRL BL (MISCELLANEOUS) ×4 IMPLANT
COVER BACK TABLE 24X17X13 BIG (DRAPES) IMPLANT
COVER TABLE BACK 60X90 (DRAPES) ×2 IMPLANT
DECANTER SPIKE VIAL GLASS SM (MISCELLANEOUS) ×2 IMPLANT
DERMABOND ADVANCED (GAUZE/BANDAGES/DRESSINGS) ×1
DERMABOND ADVANCED .7 DNX12 (GAUZE/BANDAGES/DRESSINGS) ×1 IMPLANT
DRAPE C-ARM 42X72 X-RAY (DRAPES) ×4 IMPLANT
DRAPE C-ARMOR (DRAPES) ×1 IMPLANT
DRAPE LAPAROTOMY 100X72X124 (DRAPES) ×2 IMPLANT
DRAPE POUCH INSTRU U-SHP 10X18 (DRAPES) ×2 IMPLANT
DRAPE PROXIMA HALF (DRAPES) ×1 IMPLANT
DRAPE SURG 17X23 STRL (DRAPES) ×2 IMPLANT
DRSG OPSITE 4X5.5 SM (GAUZE/BANDAGES/DRESSINGS) ×3 IMPLANT
ELECT REM PT RETURN 9FT ADLT (ELECTROSURGICAL) ×2
ELECTRODE REM PT RTRN 9FT ADLT (ELECTROSURGICAL) ×1 IMPLANT
EVACUATOR 3/16  PVC DRAIN (DRAIN) ×1
EVACUATOR 3/16 PVC DRAIN (DRAIN) ×1 IMPLANT
GAUZE SPONGE 4X4 16PLY XRAY LF (GAUZE/BANDAGES/DRESSINGS) IMPLANT
GLOVE BIO SURGEON STRL SZ8 (GLOVE) ×4 IMPLANT
GLOVE BIOGEL PI IND STRL 7.5 (GLOVE) IMPLANT
GLOVE BIOGEL PI INDICATOR 7.5 (GLOVE) ×1
GLOVE ECLIPSE 7.5 STRL STRAW (GLOVE) ×1 IMPLANT
GLOVE ECLIPSE 8.5 STRL (GLOVE) ×1 IMPLANT
GLOVE EXAM NITRILE LRG STRL (GLOVE) IMPLANT
GLOVE EXAM NITRILE MD LF STRL (GLOVE) ×1 IMPLANT
GLOVE EXAM NITRILE XL STR (GLOVE) IMPLANT
GLOVE EXAM NITRILE XS STR PU (GLOVE) IMPLANT
GLOVE INDICATOR 6.5 STRL GRN (GLOVE) ×1 IMPLANT
GLOVE INDICATOR 7.0 STRL GRN (GLOVE) ×2 IMPLANT
GLOVE INDICATOR 8.5 STRL (GLOVE) ×4 IMPLANT
GLOVE SURG SS PI 6.5 STRL IVOR (GLOVE) ×4 IMPLANT
GOWN BRE IMP SLV AUR LG STRL (GOWN DISPOSABLE) IMPLANT
GOWN BRE IMP SLV AUR XL STRL (GOWN DISPOSABLE) ×4 IMPLANT
GOWN STRL REIN 2XL LVL4 (GOWN DISPOSABLE) IMPLANT
GRAFT BN 5X1XSPNE CVD POST DBM (Bone Implant) IMPLANT
GRAFT BONE MAGNIFUSE 1X20CM (Bone Implant) ×1 IMPLANT
GRAFT BONE MAGNIFUSE 1X5CM (Bone Implant) ×2 IMPLANT
IN-UNE Clamp ×1 IMPLANT
KIT BASIN OR (CUSTOM PROCEDURE TRAY) ×2 IMPLANT
KIT INFUSE LRG II (Orthopedic Implant) ×1 IMPLANT
KIT ROOM TURNOVER OR (KITS) ×2 IMPLANT
NDL HYPO 25X1 1.5 SAFETY (NEEDLE) ×1 IMPLANT
NEEDLE HYPO 25X1 1.5 SAFETY (NEEDLE) ×2 IMPLANT
NS IRRIG 1000ML POUR BTL (IV SOLUTION) ×2 IMPLANT
PACK LAMINECTOMY NEURO (CUSTOM PROCEDURE TRAY) ×2 IMPLANT
PAD ARMBOARD 7.5X6 YLW CONV (MISCELLANEOUS) ×6 IMPLANT
SCREW SET NON BREAK OFF (Screw) ×1 IMPLANT
SPONGE GAUZE 4X4 12PLY (GAUZE/BANDAGES/DRESSINGS) ×2 IMPLANT
SPONGE LAP 4X18 X RAY DECT (DISPOSABLE) IMPLANT
SPONGE SURGIFOAM ABS GEL 100 (HEMOSTASIS) ×2 IMPLANT
STRIP CLOSURE SKIN 1/2X4 (GAUZE/BANDAGES/DRESSINGS) ×3 IMPLANT
SUT VIC AB 0 CT1 18XCR BRD8 (SUTURE) ×2 IMPLANT
SUT VIC AB 0 CT1 8-18 (SUTURE) ×4
SUT VIC AB 2-0 CT1 18 (SUTURE) ×2 IMPLANT
SUT VICRYL 4-0 PS2 18IN ABS (SUTURE) ×2 IMPLANT
SYR 20ML ECCENTRIC (SYRINGE) ×2 IMPLANT
TOWEL OR 17X24 6PK STRL BLUE (TOWEL DISPOSABLE) ×2 IMPLANT
TOWEL OR 17X26 10 PK STRL BLUE (TOWEL DISPOSABLE) ×2 IMPLANT
TRAY FOLEY CATH 14FRSI W/METER (CATHETERS) ×2 IMPLANT
U-Connector ×1 IMPLANT
WATER STERILE IRR 1000ML POUR (IV SOLUTION) ×2 IMPLANT

## 2011-04-07 NOTE — Progress Notes (Signed)
Dr. Noreene Larsson confirmed chest x-ray 1 view OK. [employer] Rep Jana Half) notified of patient's procedure. States she is aware and if needed call [phone].

## 2011-04-07 NOTE — Transfer of Care (Signed)
Immediate Anesthesia Transfer of Care Note  Patient: [name]  Procedure(s) Performed:  POSTERIOR LUMBAR FUSION 1 LEVEL - Revision of Thoracic Fusion, Add-on Screws Thoracic Four-Five, Thoracic Five-Six, Add-on to fusion through Thoracic Ten  Patient Location: PACU  Anesthesia Type: General  Level of Consciousness: sedated  Airway & Oxygen Therapy: Patient Spontanous Breathing and Patient connected to face mask oxygen  Post-op Assessment: Report given to PACU RN and Post -op Vital signs reviewed and stable  Post vital signs: Reviewed and stable Filed Vitals:   04/07/11 1515  BP:   Pulse:   Temp: 36.3 C  Resp:     Complications: No apparent anesthesia complications

## 2011-04-07 NOTE — Anesthesia Preprocedure Evaluation (Addendum)
Anesthesia Evaluation  Patient identified by MRN, date of birth, ID band Patient awake    Reviewed: Allergy & Precautions, H&P , NPO status   Airway Mallampati: II      Dental  (+) Edentulous Upper   Pulmonary  clear to auscultation        Cardiovascular Regular Normal    Neuro/Psych    GI/Hepatic   Endo/Other    Renal/GU      Musculoskeletal   Abdominal   Peds  Hematology   Anesthesia Other Findings   Reproductive/Obstetrics                           Anesthesia Physical Anesthesia Plan  ASA: III  Anesthesia Plan: General   Post-op Pain Management:    Induction: Intravenous  Airway Management Planned: Oral ETT  Additional Equipment:   Intra-op Plan:   Post-operative Plan: Extubation in OR  Informed Consent: I have reviewed the patients History and Physical, chart, labs and discussed the procedure including the risks, benefits and alternatives for the proposed anesthesia with the patient or authorized representative who has indicated his/her understanding and acceptance.   Dental advisory given  Plan Discussed with: CRNA and Surgeon  Anesthesia Plan Comments: (Chronic Afib Pacemaker Htn Chronic Back Pain  Plan GA  Kipp Brood)        Anesthesia Quick Evaluation

## 2011-04-07 NOTE — Progress Notes (Signed)
Pt. Reported that she had eaten small potato cake @0330 .  Dr. Noreene Larsson notified and stated that surgery could proceed.//L. Vanda Waskey,rn

## 2011-04-07 NOTE — Preoperative (Signed)
Beta Blockers   Reason not to administer Beta Blockers:Not Applicable 

## 2011-04-07 NOTE — Anesthesia Procedure Notes (Signed)
Procedure Name: Intubation Date/Time: 04/07/2011 11:00 AM Performed by: Caryn Bee Pre-anesthesia Checklist: Patient identified, Emergency Drugs available, Suction available, Patient being monitored and Timeout performed Patient Re-evaluated:Patient Re-evaluated prior to inductionOxygen Delivery Method: Circle System Utilized Preoxygenation: Pre-oxygenation with 100% oxygen Intubation Type: IV induction Ventilation: Mask ventilation without difficulty Laryngoscope Size: Mac and 3 Grade View: Grade I Tube type: Oral Tube size: 7.0 mm Number of attempts: 1 Airway Equipment and Method: stylet Placement Confirmation: ETT inserted through vocal cords under direct vision,  positive ETCO2 and breath sounds checked- equal and bilateral Secured at: 21 cm Tube secured with: Tape Dental Injury: Teeth and Oropharynx as per pre-operative assessment

## 2011-04-07 NOTE — Progress Notes (Signed)
Pt. Given 1 1/2 mg of Neostigmine, and 0.2 mg of Robinul IV per Carmelina Dane CRNA per Dr. Katrinka Blazing .

## 2011-04-07 NOTE — Op Note (Signed)
Preoperative diagnosis: T7-T8 instability  Postoperative diagnosis: Same  Procedure: T4-T10 stabilization using the globus addition pedicle screw rod system with [employer] bone in a  bag and BMP. With pedicle screws at T4 on the right, T5 and T6 on the right and T5 and T6 on the left tying into the rod between the T9-T10 pedicle screws.  Surgeon Jillyn Hidden Hazyl Marseille  Assistant: Marikay Alar  Anesthesia: Gen.  EBL: 250  History of present illness: Patient is a 78 year old female who had undergone previous revision fusion with removal of loose screws at T7-8 with compression deformities of the T7-T8 vertebral bodies have a vertebroplasty to over last several weeks and months her progress worsening back pain and followup imaging revealed a progressive kyphosis around T7-T8 with instability. So patient was recommended extension of her fusion from T4 down to tiny dural construct at T9-10 with her the risks and benefits of the operation with her as well as the alternatives of surgery expectations of outcome perioperative course and she understood and agreed to proceed forward. She denies any lower Sherry symptoms or weakness in her legs so a decompression was felt not to be needed. She had been decompressed previously.  Operative procedure: Patient was brought into the or was position on chest rolls and her back was prepped and draped in routine sterile fashion her old incision was opened up and extended cephalad subperiosteal dissections care of the thoracic spine extending from T3 down to rule hardware at T9-10. The TPS and rib heads were exposed from T4 down to the previous decompression site at T7-8 and the hardware at T9-10 was exposed. After adequate) scan obtained using combination of AP lateral fluoroscopy pilot holes were drilled lateral margins of the pedicles of T4-5 and 6 bilaterally. Then using alternating AP and lateral fluoroscopy pedicle screws are placed at C4-5 and 6 on the right hand T5 and 6 on the  left locking plate and hold T4 the left is felt to have migrated inferiorly and laterally and may have dilated nerve root sleeve as clear fluid was coming out of a probe hole. This hole was waxed no additional fluid leakage was appreciated and was felt not to replace the screw in this site. After all thoracic screws and placed such as a rod were identified to tiny and using the addition set and using a rod on the right side with a 2-hole connector slightly offset is wrapped around the T9 screw and hook into the rod between T9 and T10 the rod was contoured and fit in the pedicle screws at T4-T5 and T6 on the right after adequate contouring the rod was achieved all screws were tightened down this route was felt to be secure in good position. Placement of the left-sided rod was a little more problematic as connector was a little tighter fit between the T9-10 screws. The locking cap was removed from the T9 through the left this allowed mobilization of the head of the screw and subsequent allowed better fit of the connector system. He says that locked down into the Hunter rod and subsequent to that of the screws at T5 and T6. Then the top tightening that was replaced on the Legacy screw. The rods were then tested and felt to be secure across it was applied. Dressing decortication was care MTPs and lateral laminar complexes from the thoracic spine from T4 down to T10 and then using a Mason BMP and the [employer] bone the back this is all overlaid. The large abductors placed  the wounds closed in layers with after Vicryl and the skin was) 4 subcuticular is a fluoroscopy confirmed the position of the screws rods and connectors. And this little counts and sponge counts were correct.

## 2011-04-07 NOTE — H&P (Signed)
[name] is an 78 y.o. female.   Chief Complaint: Back pain HPI: Patient is a 78 year old female has had long-standing chronic problems with her low back. She initially underwent an L1-S1 fusion that did very well for several years however then developed a significant degenerative disease discitis optimize the T12-L1 after this he'll be affecting him in a control she had a fusion is extended up to T8 however TA screws broke to the interspace and she had compression deformities at T7 and T8. The screws had to be removed and arthroplasties were performed and however they have developed a progressive kyphosis at this level. This has had worsening back pain and back feels better when she tightens her brace and feels better when she lies down is worse when she is up and about denies any bowel bladder trouble she denies any weakness or numbness in her legs. Imaging MRI scan CT scan and plain films displayed a progressive kyphosis at T7-8 sedation was recommended extension of her fusion up to T4 with the plan of putting screws at T4 T5-T6 and tiny neural hardware. Presented on the risks and benefits of surgery, perioperative course, expectations of outcome, and alternatives to surgery. She understands these and agrees to proceed forward.  Past Medical History  Diagnosis Date  . Hypertension   . A-fib   . Chronic back pain   . Pacemaker   . Status post lumbar surgery   . Anemia   . Blood transfusion   . Arthritis   . Cataract     bilateral    Past Surgical History  Procedure Date  . Shoulder open rotator cuff repair right  . Fracture surgery     left wrist, with plate and removal of plate  . Cholecystectomy   . Abdominal hysterectomy   . Eye surgery   . Knee arthroplasty     right knee  . Back surgery     four back surgeries  . Insert / replace / remove pacemaker     History reviewed. No pertinent family history. Social History:  reports that she has never smoked. She does not have  any smokeless tobacco history on file. She reports that she does not drink alcohol or use illicit drugs.  Allergies:  Allergies  Allergen Reactions  . Amlodipine Anaphylaxis  . Penicillins Hives    Medications Prior to Admission  Medication Dose Route Frequency Provider Last Rate Last Dose  . bacitracin 16109 UNITS injection           . HYDROmorphone (DILAUDID) injection 0.25-0.5 mg  0.25-0.5 mg Intravenous Q5 min PRN Melonie Florida, MD      . mupirocin ointment (BACTROBAN) 2 %           . mupirocin ointment (BACTROBAN) 2 %   Nasal BID Mariam Dollar, MD   1 application at 04/07/11 [phone]  . ondansetron (ZOFRAN) injection 4 mg  4 mg Intravenous Once PRN Melonie Florida, MD      . sodium chloride 0.9 % infusion           . DISCONTD: mupirocin ointment (BACTROBAN) 2 %   Nasal BID Mariam Dollar, MD       Medications Prior to Admission  Medication Sig Dispense Refill  . aspirin EC 81 MG tablet Take 81 mg by mouth at bedtime.        . carisoprodol (SOMA) 350 MG tablet Take 350 mg by mouth 4 (four) times daily as needed. For muscle  spasm      . diazepam (VALIUM) 5 MG tablet Take 5 mg by mouth 2 (two) times daily.      . diclofenac (VOLTAREN) 50 MG EC tablet Take 50 mg by mouth 2 (two) times daily.        Marland Kitchen docusate sodium (COLACE) 100 MG capsule Take 100 mg by mouth at bedtime.        . famotidine (PEPCID) 20 MG tablet Take 20 mg by mouth 2 (two) times daily.        . flecainide (TAMBOCOR) 100 MG tablet Take 50-100 mg by mouth 2 (two) times daily. Take 1/2 tab in the am & 1 tab in the pm      . HYDROmorphone (DILAUDID) 2 MG tablet Take 2 mg by mouth every 4 (four) hours as needed.      Marland Kitchen HYDROmorphone (DILAUDID) 2 MG tablet Take 2-4 mg by mouth every 4 (four) hours as needed. For pain      . Olmesartan-Amlodipine-HCTZ (TRIBENZOR) 40-10-25 MG TABS Take 1 tablet by mouth daily.        Marland Kitchen OVER THE COUNTER MEDICATION Take 1 tablet by mouth every morning. Medication:Vitamin C/ with Vitamin D  supplement      . oxyCODONE-acetaminophen (PERCOCET) 10-325 MG per tablet Take 1-2 tablets by mouth every 4 (four) hours as needed. For pain         Results for orders placed during the hospital encounter of 04/07/11 (from the past 48 hour(s))  BASIC METABOLIC PANEL     Status: Abnormal   Collection Time   04/07/11  8:12 AM      Component Value Range Comment   Sodium 142  135 - 145 (mEq/L)    Potassium 4.5  3.5 - 5.1 (mEq/L)    Chloride 105  96 - 112 (mEq/L)    CO2 26  19 - 32 (mEq/L)    Glucose, Bld 99  70 - 99 (mg/dL)    BUN 25 (*) 6 - 23 (mg/dL)    Creatinine, Ser 1.61 (*) 0.50 - 1.10 (mg/dL)    Calcium 9.1  8.4 - 10.5 (mg/dL)    GFR calc non Af Amer 46 (*) >90 (mL/min)    GFR calc Af Amer 53 (*) >90 (mL/min)   CBC     Status: Abnormal   Collection Time   04/07/11  8:12 AM      Component Value Range Comment   WBC 5.1  4.0 - 10.5 (K/uL)    RBC 3.29 (*) 3.87 - 5.11 (MIL/uL)    Hemoglobin 10.1 (*) 12.0 - 15.0 (g/dL)    HCT 09.6 (*) 04.5 - 46.0 (%)    MCV 94.2  78.0 - 100.0 (fL)    MCH 30.7  26.0 - 34.0 (pg)    MCHC 32.6  30.0 - 36.0 (g/dL)    RDW 40.9  81.1 - 91.4 (%)    Platelets 239  150 - 400 (K/uL)   TYPE AND SCREEN     Status: Normal   Collection Time   04/07/11  8:45 AM      Component Value Range Comment   ABO/RH(D) A POS      Antibody Screen NEG      Sample Expiration 04/10/2011      No results found.  Review of Systems  Constitutional: Negative.   HENT: Negative.   Eyes: Negative.   Respiratory: Negative.   Cardiovascular: Negative.   Gastrointestinal: Positive for constipation.  Genitourinary: Negative.   Musculoskeletal:  Positive for myalgias and back pain.  Skin: Negative.   Neurological: Positive for tingling.    Blood pressure 139/77, pulse 66, temperature 97.9 F (36.6 C), temperature source Oral, resp. rate 18, height 5\' 2"  (1.575 m), weight 70.308 kg (155 lb), SpO2 98.00%. Physical Exam  Constitutional: She is oriented to person, place, and time.  She appears well-developed and well-nourished.  HENT:  Head: Normocephalic and atraumatic.  Eyes: Pupils are equal, round, and reactive to light.  Neck: Normal range of motion.  Cardiovascular: Normal rate and regular rhythm.   Respiratory: Effort normal and breath sounds normal.  GI: Soft.  Musculoskeletal: Normal range of motion.  Neurological: She is alert and oriented to person, place, and time. She has normal strength. GCS eye subscore is 4. GCS verbal subscore is 5. GCS motor subscore is 6.  Reflex Scores:      Patellar reflexes are 0 on the right side and 0 on the left side.      Achilles reflexes are 0 on the right side and 0 on the left side.      Patient has 5 out of 5 strength in her iliopsoas, quads, hamstrings, gastrocs, anterior tibialis, EHL.     Assessment/Plan 78 year old female with a thoracic instability to 36 presents for thoracic T4-T10 11 fixation. Risks and benefits of the operation, alternatives to surgery,  Of course, expectations of outcome were all patient and her son they understand and agree to proceed forward.  Amario Longmore P 04/07/2011, 10:41 AM

## 2011-04-07 NOTE — Progress Notes (Signed)
PHARMACIST - PHYSICIAN ORDER COMMUNICATION CONCERNING:  Availability of a Routine Non-Formulary Medication  RECOMMENDATION: If the noted time frame below is not acceptable, formulary alternatives are immediately available.  Please feel free to call the Pharmacy at [phone] for assistance  DESCRIPTION:  This patient has an order for  Tribenzor, which is NON-FORMULARY. This is a combo anti-HTN (HCTZ, olmesartan, amlodipine), we would typically give all the parts separately but due to amlodipine allergy we are UNABLE to do this.  She is able to tolerate Tribenzor only.   To prevent interruption of therapy for a routine medication:  The patient's family has been asked to bring the medication into the hospital, but no one is able to bring in from home.  We will need to order the drug and will be here within 72 hrs.    Consider re-ordering HCTZ and Olmesartan until Tribenzor is available.   Thank you,  Juliette Alcide, PharmD, BCPS.   04/07/2011  8:55 PM

## 2011-04-08 NOTE — Progress Notes (Signed)
Physical Therapy Evaluation Patient Details Name: [name] MRN: [id number] DOB: 07-24-1931 Today's Date: 04/08/2011  Problem List: There is no problem list on file for this patient.   Past Medical History:  Past Medical History  Diagnosis Date  . Hypertension   . A-fib   . Chronic back pain   . Pacemaker   . Status post lumbar surgery   . Anemia   . Blood transfusion   . Arthritis   . Cataract     bilateral   Past Surgical History:  Past Surgical History  Procedure Date  . Shoulder open rotator cuff repair right  . Fracture surgery     left wrist, with plate and removal of plate  . Cholecystectomy   . Abdominal hysterectomy   . Eye surgery   . Knee arthroplasty     right knee  . Back surgery     four back surgeries  . Insert / replace / remove pacemaker     PT Assessment/Plan/Recommendation PT Assessment Clinical Impression Statement: Patient is a 78 yo female admitted with increasing back pain, now s/p T4-10 stabilization.  Pain limiting session today.  Should progress well with PT.  Patient has 24 hour assist initially (daughter-in-law) and then family/neighbors to check on her.  Recommend HHPT at discharge for continued therapy. PT Recommendation/Assessment: Patient will need skilled PT in the acute care venue PT Problem List: Decreased strength;Decreased activity tolerance;Decreased mobility;Decreased balance;Decreased knowledge of use of DME;Decreased knowledge of precautions;Pain PT Therapy Diagnosis : Difficulty walking;Generalized weakness;Acute pain PT Plan PT Frequency: Min 5X/week PT Treatment/Interventions: DME instruction;Gait training;Functional mobility training;Patient/family education PT Recommendation Follow Up Recommendations: Home health PT;Supervision/Assistance - 24 hour Equipment Recommended: None recommended by PT PT Goals  Acute Rehab PT Goals PT Goal Formulation: With patient Time For Goal Achievement: 7 days Pt will Roll Supine  to Right Side: Independently PT Goal: Rolling Supine to Right Side - Progress: Goal set today Pt will Roll Supine to Left Side: Independently PT Goal: Rolling Supine to Left Side - Progress: Goal set today Pt will go Supine/Side to Sit: Independently;with HOB 0 degrees PT Goal: Supine/Side to Sit - Progress: Goal set today Pt will go Sit to Supine/Side: Independently;with HOB 0 degrees PT Goal: Sit to Supine/Side - Progress: Goal set today Pt will go Sit to Stand: with upper extremity assist;with supervision PT Goal: Sit to Stand - Progress: Goal set today Pt will go Stand to Sit: with upper extremity assist;with supervision PT Goal: Stand to Sit - Progress: Goal set today Pt will Ambulate: >150 feet;with supervision;with rolling walker PT Goal: Ambulate - Progress: Goal set today  PT Evaluation Precautions/Restrictions  Precautions Precautions: Back Required Braces or Orthoses: Yes Spinal Brace: Thoracolumbosacral orthotic;Applied in supine position (Applied in supine today.  Awaiting MD orders for supine vs sitting.) Restrictions Weight Bearing Restrictions: No Prior Functioning  Home Living Lives With: Alone Receives Help From: Family;Neighbor Type of Home: House Home Layout: One level Home Access: Ramped entrance Bathroom Shower/Tub: Engineer, manufacturing systems: Standard Bathroom Accessibility: Yes How Accessible: Accessible via wheelchair Home Adaptive Equipment: Dan Humphreys - four wheeled;Wheelchair - manual;Walker - rolling;Bedside commode/3-in-1;Tub transfer bench Prior Function Level of Independence: Independent with basic ADLs;Independent with homemaking with ambulation;Requires assistive device for independence Driving: No Cognition Cognition Arousal/Alertness: Awake/alert Overall Cognitive Status: Appears within functional limits for tasks assessed Orientation Level: Oriented X4 Sensation/Coordination   Extremity Assessment RUE Assessment RUE Assessment:  Within Functional Limits LUE Assessment LUE Assessment: Within Functional Limits RLE Assessment  RLE Assessment:  (Able to move against gravity - did not MMT) LLE Assessment LLE Assessment:  (Able to move against gravity - did not MMT) Mobility (including Balance) Bed Mobility Bed Mobility: Yes Rolling Right: 5: Supervision;With rail Rolling Right Details (indicate cue type and reason): Patient with good understanding of log rolling - no twisting.  Able to roll using rail to don brace. Rolling Left: 5: Supervision;With rail Left Sidelying to Sit: 3: Mod assist;With rails;HOB flat Left Sidelying to Sit Details (indicate cue type and reason): Assist to raise trunk from bed.  Reviewed technique with patient. Sitting - Scoot to Edge of Bed: 5: Supervision Sitting - Scoot to [place] of Bed Details (indicate cue type and reason): Supervision for safety only. Transfers Transfers: Yes Sit to Stand: 4: Min assist;With upper extremity assist;From bed Sit to Stand Details (indicate cue type and reason): Cues for safety and technique. Stand to Sit: 4: Min assist;With upper extremity assist;With armrests;To chair/3-in-1 Stand to Sit Details: Verbal cues for hand placement.  Assist to control descent into chair. Stand Pivot Transfers: 4: Min assist;With armrests Stand Pivot Transfer Details (indicate cue type and reason): Verbal cues for safe technique Ambulation/Gait Ambulation/Gait: No    Exercise    End of Session PT - End of Session Equipment Utilized During Treatment: Gait belt;Back brace Activity Tolerance: Patient limited by pain;Patient limited by fatigue Patient left: in chair;with call bell in reach Nurse Communication: Mobility status for transfers General Behavior During Session: [facility] for tasks performed Cognition: [facility] for tasks performed  Vena Austria [phone] 04/08/2011, 1:45 PM

## 2011-04-08 NOTE — Progress Notes (Signed)
CSW received consult for SNF placement. PT recommendation for HHPT at d/c noted. CSW signing off. Please re-consult if SNF needed.  Dellie Burns, MSW, [place] [phone] (weekend)

## 2011-04-08 NOTE — Progress Notes (Signed)
Doing well. C/o appropriate incisional pain  No Numbness, tingling, weakness No Nausea /vomiting   Temp:  [97.4 F (36.3 C)-98.6 F (37 C)] 98.6 F (37 C) (01/19 0000) Pulse Rate:  [59-87] 61  (01/19 0700) Resp:  [8-24] 19  (01/19 0700) BP: (102-173)/(31-107) 116/41 mmHg (01/19 0700) SpO2:  [93 %-100 %] 99 % (01/19 0700) Weight:  [77.7 kg (171 lb 4.8 oz)] 77.7 kg (171 lb 4.8 oz) (01/18 1735) Good strength and sensation Incision CDI Drain with sig drainage still  Plan: Increase activity - , keep in ICU - ?remove drain in am?

## 2011-04-09 MED ORDER — PANTOPRAZOLE SODIUM 40 MG PO TBEC
40.0000 mg | DELAYED_RELEASE_TABLET | Freq: Every day | ORAL | Status: DC
  Administered 2011-04-09 – 2011-04-10 (×2): 40 mg via ORAL
  Filled 2011-04-09 (×2): qty 1

## 2011-04-09 NOTE — Progress Notes (Signed)
Subjective: Patient OOB in chair , ambulating to BR  Objective: Vital signs in last 24 hours: Temp:  [97.9 F (36.6 C)-99.2 F (37.3 C)] 97.9 F (36.6 C) (01/20 0400) Pulse Rate:  [59-80] 63  (01/20 0430) Resp:  [14-22] 22  (01/20 0430) BP: (105-136)/(32-92) 114/57 mmHg (01/20 0430) SpO2:  [91 %-100 %] 91 % (01/20 0430)  Intake/Output from previous day: 01/19 0701 - 01/20 0700 In: 2720 [P.O.:720; I.V.:1800; IV Piggyback:200] Out: 2525 [Urine:2275; Drains:250] Intake/Output this shift:    moves le ext well - incision c/d/i  Lab Results:  Basename 04/07/11 0812  WBC 5.1  HGB 10.1*  HCT 31.0*  PLT 239   BMET  Basename 04/07/11 0812  NA 142  K 4.5  CL 105  CO2 26  GLUCOSE 99  BUN 25*  CREATININE 1.12*  CALCIUM 9.1    Studies/Results: Dg Thoracic Spine 2 View  04/07/2011  *RADIOLOGY REPORT*  Clinical Data: Thoracic vertebral compression fractures kyphosis. Thoracic spine fusion.  THORACIC SPINE - 2 VIEW  Comparison: CT on 04/04/2011  Findings: Intraoperative AP and cross-table lateral images again show compression fractures and previous vertebroplasties of the T7- T8 vertebral bodies.  Fixation screws and posterior fixation rods are seen in place from levels of T4-T10, and thoracic spine alignment appears anatomic at these levels.  IMPRESSION: Posterior thoracic spine fusion from T4-T10 with anatomic alignment.  Original Report Authenticated By: Danae Orleans, M.D.   Dg Chest Port 1 View  04/07/2011  *RADIOLOGY REPORT*  Clinical Data: Central line placement  PORTABLE CHEST - 1 VIEW  Comparison: Portable chest x-ray of 08/17/2010  Findings: Both right and left central venous lines are present. However the tips of these lines are difficult to visualize due to overlying Harrington rods for fixation of the thoracic spine. Bibasilar opacities are noted most consistent with atelectasis. Cardiomegaly is stable. A permanent pacemaker is present.  IMPRESSION: 1.  Bilateral central  venous lines are present but the tips cannot be seen due to overlying Harrington rods. 2.  Bibasilar atelectasis.  Consider follow-up to exclude pneumonia.  Original Report Authenticated By: Juline Patch, M.D.    Assessment/Plan: Continue increasing activity , d/c drain , d/c central line  LOS: 2 days     Makalah Asberry R, MD 04/09/2011, 8:25 AM

## 2011-04-09 NOTE — Progress Notes (Signed)
Physical Therapy Treatment Patient Details Name: [name] MRN: [id number] DOB: 19-Nov-1931 Today's Date: 04/09/2011  PT Assessment/Plan  PT - Assessment/Plan Comments on Treatment Session: Great progression with mobility today with increased activity and ambulation distance. PT Plan: Discharge plan remains appropriate;Frequency remains appropriate PT Frequency: Min 5X/week Follow Up Recommendations: Home health PT;Supervision/Assistance - 24 hour Equipment Recommended: None recommended by PT PT Goals  Acute Rehab PT Goals PT Goal: Rolling Supine to Left Side - Progress: Progressing toward goal PT Goal: Supine/Side to Sit - Progress: Progressing toward goal PT Goal: Sit to Stand - Progress: Progressing toward goal PT Goal: Stand to Sit - Progress: Progressing toward goal PT Goal: Ambulate - Progress: Progressing toward goal  PT Treatment Precautions/Restrictions  Precautions Precautions: Back Precaution Comments: reeducated pt on 3/3 back precautions Required Braces or Orthoses: Yes Spinal Brace: Thoracolumbosacral orthotic;Applied in sitting position (pt states she can don EOB. no order stating this.) Restrictions Weight Bearing Restrictions: No Mobility (including Balance) Bed Mobility Rolling Left: 5: Supervision;With rail Rolling Left Details (indicate cue type and reason): cues to follow logroll technique.  Left Sidelying to Sit: 4: Min assist;HOB flat Left Sidelying to Sit Details (indicate cue type and reason): no rails needed. pt able to bring legs off edge of bed independently, req'd min assit to elevate trunk with cues to use arms. pt wanting to pull up into sitting with PTA hand. educated pt to use both hands on bed to sit up. Sitting - Scoot to Edge of Bed: 5: Supervision Sitting - Scoot to [place] of Bed Details (indicate cue type and reason): supervision for safety due to multiple lines/leads. Transfers Sit to Stand: 4: Min assist;From bed;From toilet;With upper  extremity assist Sit to Stand Details (indicate cue type and reason): cues for hand placement for safety. min assit from both the bed and the low toilet. pt use a grab bar in bathroom to assist with standing from low toilet. Stand to Sit: 5: Supervision;With upper extremity assist;To chair/3-in-1;With armrests Stand to Sit Details: cues to reach back for armrests for safety and to back all the way to the surface before sitting. Ambulation/Gait Ambulation/Gait: Yes Ambulation/Gait Assistance: 4: Min assist Ambulation/Gait Assistance Details (indicate cue type and reason): cues/assist for upright posture, to increase bil step length/height and to stay with walker. pt used furniture/HHA for 10 feet from bed to bathroom toilet. Not safe this way due to flexed posture and unsteadiness. increased steadiness with RW for gait out of bathroom and in hallway. Ambulation Distance (Feet): 150 Feet Assistive device: Rolling walker Gait Pattern: Step-through pattern;Decreased stride length;Decreased step length - right;Decreased step length - left;Trunk flexed;Decreased hip/knee flexion - right;Decreased hip/knee flexion - left  Posture/Postural Control Posture/Postural Control: No significant limitations Exercise    End of Session PT - End of Session Equipment Utilized During Treatment: Gait belt;Back brace Activity Tolerance: Patient tolerated treatment well Patient left: in chair;with call bell in reach Nurse Communication: Mobility status for transfers;Mobility status for ambulation General Behavior During Session: [facility] for tasks performed Cognition: [facility] for tasks performed  Sallyanne Kuster 04/09/2011, 1:46 PM  Sallyanne Kuster, PTA Office- [phone]

## 2011-04-10 NOTE — Progress Notes (Signed)
Subjective: Patient reports That she's feeling better her legs are feeling good her getting stronger she has no new numbness or tingling. Back pain is there but is improving  Objective: Vital signs in last 24 hours: Temp:  [97.8 F (36.6 C)-98.4 F (36.9 C)] 98.1 F (36.7 C) (01/21 0700) Pulse Rate:  [62-82] 74  (01/21 1100) Resp:  [5-26] 17  (01/21 1100) BP: (106-159)/(30-61) 122/52 mmHg (01/21 0800) SpO2:  [90 %-100 %] 98 % (01/21 1100)  Intake/Output from previous day: 01/20 0701 - 01/21 0700 In: 895 [P.O.:820; I.V.:75] Out: 1875 [Urine:1875] Intake/Output this shift:    Strength is 5 out of 5 in her lower extremities and is clean and dry.  Lab Results: No results found for this basename: WBC:2,HGB:2,HCT:2,PLT:2 in the last 72 hours BMET No results found for this basename: NA:2,K:2,CL:2,CO2:2,GLUCOSE:2,BUN:2,CREATININE:2,CALCIUM:2 in the last 72 hours  Studies/Results: No results found.  Assessment/Plan: 78 year old female is a 78 from T4-T10 doing very well making progress ambulating with physical therapy urinating well. We'll maintain the patient in the ICU for an additional 24 hours and then will transfer the floor more work on placement the  LOS: 3 days     Halsey Persaud P 04/10/2011, 1:00 PM

## 2011-04-10 NOTE — Progress Notes (Signed)
Occupational Therapy Evaluation Patient Details Name: [name] MRN: [id number] DOB: Sep 21, 1931 Today's Date: 04/10/2011  Problem List: There is no problem list on file for this patient.   Past Medical History:  Past Medical History  Diagnosis Date  . Hypertension   . A-fib   . Chronic back pain   . Pacemaker   . Status post lumbar surgery   . Anemia   . Blood transfusion   . Arthritis   . Cataract     bilateral   Past Surgical History:  Past Surgical History  Procedure Date  . Shoulder open rotator cuff repair right  . Fracture surgery     left wrist, with plate and removal of plate  . Cholecystectomy   . Abdominal hysterectomy   . Eye surgery   . Knee arthroplasty     right knee  . Back surgery     four back surgeries  . Insert / replace / remove pacemaker     OT Assessment/Plan/Recommendation OT Assessment Clinical Impression Statement: 78 yo female s/p  s/p T4-10 stabilization that does not require OT acutely. Pt declining further OT at this time OT Recommendation/Assessment: Patient does not need any further OT services OT Recommendation Follow Up Recommendations: No OT follow up Equipment Recommended:  (Pt has all necessary home equipment. Pt has RW)     OT Evaluation Precautions/Restrictions  Precautions Precautions: Back Precaution Comments: Pt able to verbalize 3/3 back precautions without need for verbal cues.  Required Braces or Orthoses: Yes Spinal Brace: Thoracolumbosacral orthotic;Applied in sitting position Restrictions Weight Bearing Restrictions: No Prior Functioning Home Living Lives With: Alone Receives Help From: Family;Neighbor Type of Home: House Home Layout: One level Home Access: Ramped entrance Bathroom Shower/Tub: Engineer, manufacturing systems: Standard Bathroom Accessibility: Yes How Accessible: Accessible via wheelchair Home Adaptive Equipment: Dan Humphreys - four wheeled;Wheelchair - manual;Walker - rolling;Bedside  commode/3-in-1;Tub transfer bench (Rollator) Additional Comments: Pt has son and Linda(friend) assistance for d/c home. Pt verbalized all equipment and 100% correct use of equipment. pt verbalized don and doff of TLSO. Pt states "why do I need OT I have been doing this for myself"  Prior Function Level of Independence: Independent with basic ADLs;Independent with homemaking with ambulation;Requires assistive device for independence Able to Take Stairs?: Yes Driving: Yes (pt drove less than a week ago. Drives to church) Vocation: Retired Comments: Pt enjoys church activities. pt is on fixed income and has assistance from church family to pay medical bills. Pt only goes to grocery store or doctors office if assistance is available to go with her. ADL ADL Eating/Feeding: Performed;Modified independent Where Assessed - Eating/Feeding: Chair Grooming: Simulated;Wash/dry face;Modified independent Where Assessed - Grooming: Sitting, chair;Supported Location manager Dressing: Simulated;Modified independent Where Assessed - Upper Body Dressing: Sitting, chair;Supported Lower Body Dressing: Simulated;Modified independent Where Assessed - Lower Body Dressing: Sitting, chair;Unsupported (pt is able to cross BIL LE) Ambulation Related to ADLs: declined ambulation stating that she moved this morning already with therapy. At the end of the session pt states "i am sorry i am so difficult"  ADL Comments: Pt demonstrates adequate ability to perform LB ADLS , don/doff brace and maintain back precautions. Pt verbalized history of back brace and course of treatment up until this visit in detail to therapist. OT will speak with PT Dahlia Client) and request that PT contact OT if deficits arise with PT sessions. PT currently does not demonstrate acute OT needs Vision/Perception  Vision - History Baseline Vision: Wears glasses all the time Patient  Visual Report: No change from baseline Vision - Assessment Eye Alignment: Within  Functional Limits Vision Assessment: Vision not tested Cognition Cognition Arousal/Alertness: Awake/alert Overall Cognitive Status: Appears within functional limits for tasks assessed Orientation Level: Oriented X4 Sensation/Coordination Sensation Light Touch: Appears Intact Coordination Gross Motor Movements are Fluid and Coordinated: Yes Fine Motor Movements are Fluid and Coordinated: Yes Extremity Assessment RUE Assessment RUE Assessment: Within Functional Limits LUE Assessment LUE Assessment: Within Functional Limits Mobility  Bed Mobility Bed Mobility: No Transfers Transfers: No Exercises   End of Session OT - End of Session Equipment Utilized During Treatment: Back brace Activity Tolerance: Patient tolerated treatment well Patient left: in chair;with call bell in reach;Other (comment) (RN Tammy Sours informed to check on pt in one hour per pt request) General Behavior During Session: [facility] for tasks performed Cognition: [facility] for tasks performed  Pt educated and informed of OT evaluation during session. Pt provided education on OT v/s PT. Pt agreeable to OT signing off and ending OT services per pt request. Pt provided one time evaluation without acute needs.   Harrel Carina [facility] 04/10/2011, 3:51 PM  Pager: [phone]

## 2011-04-10 NOTE — Addendum Note (Signed)
Addendum  created 04/10/11 1036 by Alphonsus Sias. Sharyn Creamer, CRNA   Modules edited:Anesthesia Events

## 2011-04-10 NOTE — Progress Notes (Signed)
Physical Therapy Treatment Patient Details Name: [name] MRN: [id number] DOB: 1932-01-01 Today's Date: 04/10/2011  PT Assessment/Plan  PT - Assessment/Plan Comments on Treatment Session: Patient is a 78 yo female admitted with increasing back pain, now s/p T4-10 stabilization. Pt mobilizing well today, supervision with gait today. Will benefit from continued increased endurance and improved balance without UE support. Pt continues to report she will have 24 hour assist at home. PT Plan: Discharge plan remains appropriate;Frequency remains appropriate PT Frequency: Min 5X/week Follow Up Recommendations: Home health PT;Supervision/Assistance - 24 hour Equipment Recommended: Rolling walker with 5" wheels (unless pt already has (will assess next session.)) PT Goals  Acute Rehab PT Goals Pt will go Sit to Stand: with upper extremity assist;with supervision PT Goal: Sit to Stand - Progress: Met Pt will go Stand to Sit: with upper extremity assist;with supervision PT Goal: Stand to Sit - Progress: Met Pt will Ambulate: >150 feet;with supervision;with rolling walker PT Goal: Ambulate - Progress: Met  PT Treatment Precautions/Restrictions  Precautions Precautions: Back Precaution Comments: Pt able to verbalize 3/3 back precautions without need for verbal cues.  Required Braces or Orthoses: Yes Spinal Brace: Thoracolumbosacral orthotic;Applied in sitting position (pt states she can don EOB. no order stating this.) Restrictions Weight Bearing Restrictions: No Mobility (including Balance) Bed Mobility Sitting - Scoot to Edge of Bed: 5: Supervision (*Edge of chair) Transfers Transfers: Yes Sit to Stand: From toilet;With upper extremity assist;5: Supervision;From chair/3-in-1 Sit to Stand Details (indicate cue type and reason): Verbal cues for hand positioning, supervision for safety. Pt very verbal about wanting her independence.  Stand to Sit: 5: Supervision;With upper extremity  assist;To chair/3-in-1;With armrests Stand to Sit Details: Verbal cues to utilize armrests for safety, verbal cues for descent control.  Ambulation/Gait Ambulation/Gait: Yes Ambulation/Gait Assistance: 5: Supervision Ambulation/Gait Assistance Details (indicate cue type and reason): Verbal cues for upright posture, improved speed.  Ambulation Distance (Feet): 160 Feet Assistive device: Rolling walker Gait Pattern: Step-through pattern;Decreased stride length;Decreased step length - right;Decreased step length - left;Trunk flexed;Decreased hip/knee flexion - right;Decreased hip/knee flexion - left;Decreased stance time - left Stairs: No    Exercise  General Exercises - Lower Extremity Ankle Circles/Pumps: AROM;Both;15 reps;Seated (Pt reporting "I do these all the time") End of Session PT - End of Session Equipment Utilized During Treatment: Gait belt;Back brace Activity Tolerance: Patient tolerated treatment well Patient left: in chair;with call bell in reach;with family/visitor present Nurse Communication: Mobility status for transfers;Mobility status for ambulation General Behavior During Session: [facility] for tasks performed Cognition: [facility] for tasks performed  Sherie Don 04/10/2011, 10:49 AM  Sherie Don) Carleene Mains PT, DPT Acute Rehabilitation [phone]

## 2011-04-10 NOTE — Anesthesia Postprocedure Evaluation (Signed)
  Anesthesia Post-op Note  Patient: [name]  Procedure(s) Performed:  POSTERIOR LUMBAR FUSION 1 LEVEL - Revision of Thoracic Fusion, Add-on Screws Thoracic Four-Five, Thoracic Five-Six, Add-on to fusion through Thoracic Ten  Patient Location: PACU  Anesthesia Type: General  Level of Consciousness: awake and alert   Airway and Oxygen Therapy: Patient Spontanous Breathing and Patient connected to nasal cannula oxygen  Post-op Pain: mild  Post-op Assessment: Post-op Vital signs reviewed and Patient's Cardiovascular Status Stable  Post-op Vital Signs: stable  Complications: No apparent anesthesia complications

## 2011-04-10 NOTE — Progress Notes (Signed)
OT Discharge Note  Patient is being discharged from OT services secondary to:    Goals met and no further therapy needs identified.  Please see latest Therapy Progress Note for current level of functioning and progress toward goals.  Progress and discharge plan and discussed with patient/caregiver and they    Agree    Johnston, Venie Montesinos Brynn   OTR/L Pager: [phone] Office: [phone] .     

## 2011-04-11 ENCOUNTER — Encounter (HOSPITAL_COMMUNITY): Payer: Self-pay | Admitting: Physician Assistant

## 2011-04-11 DIAGNOSIS — A0472 Enterocolitis due to Clostridium difficile, not specified as recurrent: Secondary | ICD-10-CM | POA: Diagnosis not present

## 2011-04-11 LAB — BASIC METABOLIC PANEL
Chloride: 101 mEq/L (ref 96–112)
GFR calc non Af Amer: 46 mL/min — ABNORMAL LOW (ref >90)
Glucose, Bld: 120 mg/dL — ABNORMAL HIGH (ref 70–99)
Potassium: 3.7 mEq/L (ref 3.5–5.1)
Sodium: 138 mEq/L (ref 135–145)

## 2011-04-11 LAB — DIFFERENTIAL
Lymphs Abs: 0.6 10*3/uL — ABNORMAL LOW (ref 0.7–4.0)
Monocytes Relative: 8 % (ref 3–12)
Neutro Abs: 6.4 10*3/uL (ref 1.7–7.7)
Neutrophils Relative %: 82 % — ABNORMAL HIGH (ref 43–77)

## 2011-04-11 LAB — CBC
Hemoglobin: 8.3 g/dL — ABNORMAL LOW (ref 12.0–15.0)
MCH: 31.3 pg (ref 26.0–34.0)
RBC: 2.65 MIL/uL — ABNORMAL LOW (ref 3.87–5.11)

## 2011-04-11 MED ORDER — METRONIDAZOLE 250 MG PO TABS
250.0000 mg | ORAL_TABLET | Freq: Four times a day (QID) | ORAL | Status: DC
  Administered 2011-04-11 – 2011-04-15 (×16): 250 mg via ORAL
  Filled 2011-04-11 (×21): qty 1

## 2011-04-11 MED ORDER — BISMUTH SUBSALICYLATE 262 MG/15ML PO SUSP
30.0000 mL | ORAL | Status: DC | PRN
  Administered 2011-04-12: 30 mL via ORAL
  Filled 2011-04-11: qty 236

## 2011-04-11 MED ORDER — SODIUM CHLORIDE 0.45 % IV SOLN
INTRAVENOUS | Status: DC
  Administered 2011-04-11 – 2011-04-13 (×4): via INTRAVENOUS

## 2011-04-11 NOTE — Consult Note (Signed)
[facility] Consult: 1:05 PM 04/11/2011   Referring Provider: Dr Donalee Citrin Primary Care Physician:  Paulina Fusi, MD, MD Primary Gastroenterologist:  none  Reason for Consultation:  Diarrhea  HPI: [name] is a 78 y.o. female.  S/P multilple spinal surgeries.  October 2012 had thoracic spine surgery.  02/05/12 had multilevel thoracic spine stabilization/fixation with screws and rod.   Making good progress with mobility.  However she has developed diarrhea.  It began later yesterday when she had 6 or so watery and loose but non-bloody stools.  She had three this AM.  Pt only has abd discomfort just before BM but per dr Lonie Peak note  abdomen is diffusely tender on exam.  Appetite is diminished.  Started on prn bismuth and  C diff pcr test ordered today.  Until today had been receiving colace once daily.  Last antibiotic was IV vancomycin, on 04/07/11 - 04/08/11.  She is receiving both Protonix po and Pepcid po.  Pain management consists of Dilaudid, Voltaren, Flexeril, Soma.  She has no previous problems with constipation or diarrhea other than rare occasions.   She has never had a Colonoscopy or EGD.  She refused these previously because of her focus on spine problems and pain.  She does not want to undergo a colonoscopy or flex sig now.   Past Medical History  Diagnosis Date  . Hypertension   . A-fib   . Chronic back pain   . Pacemaker   . Status post lumbar surgery   . Anemia   . Blood transfusion   . Arthritis   . Cataract     bilateral    Past Surgical History  Procedure Date  . Shoulder open rotator cuff repair right  . Fracture surgery     left wrist, with plate and removal of plate  . Cholecystectomy   . Abdominal hysterectomy   . Eye surgery   . Knee arthroplasty     right knee  . Back surgery     four back surgeries  . Insert / replace / remove pacemaker     Prior to Admission medications   Medication Sig Start Date  End Date Taking? Authorizing Provider  aspirin EC 81 MG tablet Take 81 mg by mouth at bedtime.     Yes Historical Provider, MD  carisoprodol (SOMA) 350 MG tablet Take 350 mg by mouth 4 (four) times daily as needed. For muscle spasm   Yes Historical Provider, MD  diclofenac (VOLTAREN) 50 MG EC tablet Take 50 mg by mouth 2 (two) times daily.     Yes Historical Provider, MD  docusate sodium (COLACE) 100 MG capsule Take 100 mg by mouth at bedtime.     Yes Historical Provider, MD  famotidine (PEPCID) 20 MG tablet Take 20 mg by mouth 2 (two) times daily.     Yes Historical Provider, MD  flecainide (TAMBOCOR) 100 MG tablet Take 50-100 mg by mouth 2 (two) times daily. Take 1/2 tab in the am & 1 tab in the pm   Yes Historical Provider, MD  HYDROmorphone (DILAUDID) 2 MG tablet Take 2-4 mg by mouth every 4 (four) hours as needed. For pain   Yes Historical Provider, MD  Olmesartan-Amlodipine-HCTZ (TRIBENZOR) 40-10-25 MG TABS Take 1 tablet by mouth daily.    Yes Historical Provider, MD  OVER THE COUNTER MEDICATION Take 1 tablet by mouth every morning. Medication:Vitamin C/ with Vitamin D supplement   Yes Historical Provider, MD  oxyCODONE-acetaminophen (PERCOCET) 10-325 MG per  tablet Take 1-2 tablets by mouth every 4 (four) hours as needed. For pain    Yes Historical Provider, MD    Scheduled Meds:    . aspirin EC  81 mg Oral QHS  . diclofenac  50 mg Oral BID  . docusate sodium  100 mg Oral QHS  . famotidine  20 mg Oral BID  . flecainide  50 mg Oral Daily   And  . flecainide  100 mg Oral QHS  . mupirocin ointment   Nasal BID  . Olmesartan-Amlodipine-HCTZ  1 tablet Oral Daily  . pantoprazole  40 mg Oral QHS  . sodium chloride  3 mL Intravenous Q12H   Infusions:    . sodium chloride Stopped (04/09/11 0800)  . sodium chloride 75 mL/hr at 04/11/11 1052   PRN Meds: acetaminophen, acetaminophen, bismuth subsalicylate, carisoprodol, cyclobenzaprine, HYDROmorphone (DILAUDID) injection, HYDROmorphone,  menthol-cetylpyridinium, ondansetron (ZOFRAN) IV, phenol   Allergies as of 04/05/2011 - Review Complete 04/05/2011  Allergen Reaction Noted  . Amlodipine Anaphylaxis 03/25/2011  . Penicillins Hives 03/25/2011   Family history:  No intestinal diseases or cancers.  No colitis.  Mother survived uterine cancer.  History   Social History  . Marital Status: Widowed    Spouse Name: N/A    Number of Children: N/A  . Years of Education: N/A   Occupational History  . Not on file.   Social History Main Topics  . Smoking status: Never Smoker   . Smokeless tobacco: Not on file  . Alcohol Use: No  . Drug Use: No  . Sexually Active:     REVIEW OF SYSTEMS: Constitutional:  No fatigue or weakness ENT:  No nose bleeds Pulm:  No cough or SOB CV:  No palpitations.  No coumadin, it was dc'd after she suffered SDH in past GU:  No hematuria or dysuria GI:  Above.  No heartburn, dysphagia, nausea. No sick contacts Heme:  No unusual bruising or bleeding.    Transfusions:  08/2010 transfusion. Neuro:  No numbness or tingling. No hx stroke Derm:  No rash or itching Endocrine:  No polydipsia, polyuria. Immunization:  Flu shot up to date. Travel:  None beyond local counties   PHYSICAL EXAM: Vital signs in last 24 hours: Temp:  [98.5 F (36.9 C)-98.7 F (37.1 C)] 98.7 F (37.1 C) (01/21 2021) Pulse Rate:  [67-84] 69  (01/22 1200) Resp:  [6-26] 22  (01/22 1200) BP: (90-149)/(38-70) 115/45 mmHg (01/22 1200) SpO2:  [91 %-100 %] 100 % (01/22 1200)  Last BM Date: 04/11/11  General:  Looks unwell Head:  No signs trauma.  Symmetric face.  Eyes:  No icterus .  No pallor Ears:  slihtly HOH  Nose:  No discharge Mouth:  Few teeth remain.  Pink, moist MM. Neck:  No JVD or mass Lungs:  Clear B.  No cough. Heart: RRR. 1/6 systolic murmer. Abdomen:  Soft, NT,ND, active BS.   Rectal: not done as a flexi seal rectal cather is in place, bag is empty.   Musc/Skeltl: no swollen joints Extremities:   No pedal edema.  Neurologic:  A & O x 3.  Moves all 4s.  No tremor. Skin:  No rash or sores. Tattoos:  None. Nodes:  None at neck or groin.   Psych:  Pleasant.  Fair historian.    Intake/Output from previous day: 01/21 0701 - 01/22 0700 In: 63 [P.O.:60; I.V.:3] Out: 625 [Urine:625] Intake/Output this shift: Total I/O In: 85 [I.V.:85] Out: -   LAB RESULTS:  Results for LOYDA, COSTIN (MRN [id number]) as of 04/11/2011 13:28  Ref. Range 08/18/2010 06:39 08/19/2010 03:40 12/26/2010 11:13 04/07/2011 08:12 04/11/2011 09:21  WBC Latest Range: 4.0-10.5 K/uL 7.5 9.1 7.4 5.1 7.8  RBC Latest Range: 3.87-5.11 MIL/uL 2.85 (L) 4.04 3.45 (L) 3.29 (L) 2.65 (L)  HGB Latest Range: 12.0-15.0 g/dL 8.8 (L) 40.9 POST TRANSFUSION SPECIMEN 10.8 (L) 10.1 (L) 8.3 (L)  HCT Latest Range: 36.0-46.0 % 25.7 (L) 35.9 (L) 33.4 (L) 31.0 (L) 25.5 (L)  MCV Latest Range: 78.0-100.0 fL 90.2 88.9 96.8 94.2 96.2  MCH Latest Range: 26.0-34.0 pg 30.9 31.2 31.3 30.7 31.3  MCHC Latest Range: 30.0-36.0 g/dL 81.1 91.4 78.2 95.6 21.3  RDW Latest Range: 11.5-15.5 % 14.3 13.8 13.4 13.7 13.4  Platelets Latest Range: 150-400 K/uL 215 193 276 239 317     BMET Lab Results  Component Value Date   NA 138 04/11/2011   NA 142 04/07/2011   NA 142 12/26/2010   K 3.7 04/11/2011   K 4.5 04/07/2011   K 4.8 12/26/2010   CL 101 04/11/2011   CL 105 04/07/2011   CL 106 12/26/2010   CO2 28 04/11/2011   CO2 26 04/07/2011   CO2 27 12/26/2010   GLUCOSE 120* 04/11/2011   GLUCOSE 99 04/07/2011   GLUCOSE 107* 12/26/2010   BUN 21 04/11/2011   BUN 25* 04/07/2011   BUN 30* 12/26/2010   CREATININE 1.13* 04/11/2011   CREATININE 1.12* 04/07/2011   CREATININE 1.57* 12/26/2010   CALCIUM 8.7 04/11/2011   CALCIUM 9.1 04/07/2011   CALCIUM 9.3 12/26/2010   LFT No results found for this basename: prot, albumin, ast, alt, alkphos, bilitot, bilidir, ibili   PT/INR Lab Results  Component Value Date   INR 1.01 12/26/2010   INR 1.02 08/16/2010   INR 1.00 07/20/2010    C-Diff No components found with this basename: cdiff   RADIOLOGY STUDIES: No results found.  ENDOSCOPIC STUDIES: No EGD or Colonoscopy ever.  IMPRESSION: 1.  Diarrhea. 2.  Chronic anemia, normocytic.  3.  Atrial Fibrillation, paroxysmal.  Currently in NSR  Not on coumadin because of a previous fall and SDH. 4.  S/P multiple spinal surgeries, 4 since spring 2012.  PLAN: 1.  Await C Diff study.  Stop the Protonix, leave the Pepcid in place.     LOS: 4 days   Jennye Moccasin  04/11/2011, 1:05 PM Pager: [phone]  Addendum:  4:15 PM:   C Diff is positive.  Placed order for Metronidazole 250 mg every 6 hours for 10 days.   GI ATTENDING  HISTORY, LABS REVIEWED. PATIENT SEEN AND EXAMINED. AGREE WITH H&P. ASKED TO SEE FOR DIARRHEA. ABDOMEN IS BENIGN. C. DIFF +. WILL TREAT WITH FLAGYL AS OUTLINED. THANK YOU.  Wilhemina Bonito. Eda Keys., M.D. [facility] Division of Gastroenterology

## 2011-04-11 NOTE — Progress Notes (Signed)
Subjective: Patient reports That she doesn't feel very well this morning she had diarrhea all-night in her stomach is upset she does not feel like eating  Objective: Vital signs in last 24 hours: Temp:  [98.1 F (36.7 C)-98.7 F (37.1 C)] 98.7 F (37.1 C) (01/21 2021) Pulse Rate:  [67-83] 77  (01/22 0700) Resp:  [6-26] 6  (01/22 0700) BP: (106-149)/(38-52) 149/50 mmHg (01/22 0700) SpO2:  [91 %-100 %] 100 % (01/22 0700)  Intake/Output from previous day: 01/21 0701 - 01/22 0700 In: 63 [P.O.:60; I.V.:3] Out: 625 [Urine:625] Intake/Output this shift:    Abdomen is all the diffusely tender but soft strength is 5 out of 5 in her lower 70s and her wound is clean and dry  Lab Results: No results found for this basename: WBC:2,HGB:2,HCT:2,PLT:2 in the last 72 hours BMET No results found for this basename: NA:2,K:2,CL:2,CO2:2,GLUCOSE:2,BUN:2,CREATININE:2,CALCIUM:2 in the last 72 hours  Studies/Results: No results found.  Assessment/Plan: 78 year old postop day 4 from thoracic stabilization with gastroenteritis will start some Kaopectate also send her stool for C. difficile toxin restart some IV fluids  LOS: 4 days     Lamine Laton P 04/11/2011, 8:46 AM

## 2011-04-11 NOTE — Progress Notes (Signed)
Physical Therapy Treatment Patient Details Name: [name] MRN: [id number] DOB: 1931/11/07 Today's Date: 04/11/2011  PT Assessment/Plan  PT - Assessment/Plan Comments on Treatment Session: Patient is a 78 yo female admitted with increasing back pain, now s/p T4-10 stabilization. Pt limited with ambulation today as she was found positive for C. diff and didn't desire to leave room.  PT Plan: Discharge plan remains appropriate;Frequency remains appropriate PT Frequency: Min 5X/week Follow Up Recommendations: Home health PT;Supervision/Assistance - 24 hour Equipment Recommended: None recommended by PT PT Goals  Acute Rehab PT Goals Pt will Roll Supine to Left Side: Independently PT Goal: Rolling Supine to Left Side - Progress: Progressing toward goal Pt will go Supine/Side to Sit: Independently;with HOB 0 degrees PT Goal: Supine/Side to Sit - Progress: Progressing toward goal Pt will go Sit to Stand: with upper extremity assist;with modified independence (progressed as met last session) PT Goal: Sit to Stand - Progress: Progressing toward goal Pt will go Stand to Sit: with upper extremity assist;with modified independence (progressed as met last session) PT Goal: Stand to Sit - Progress: Progressing toward goal Pt will Ambulate: >150 feet;with rolling walker;with modified independence (progressed as met last session) PT Goal: Ambulate - Progress: Progressing toward goal  PT Treatment Precautions/Restrictions  Precautions Precautions: Back Precaution Comments: Pt able to verbalize 3/3 back precautions without need for verbal cues.  Required Braces or Orthoses: Yes Spinal Brace: Thoracolumbosacral orthotic;Applied in sitting position Restrictions Weight Bearing Restrictions: No Mobility (including Balance) Bed Mobility Bed Mobility: Yes Rolling Left: With rail;6: Modified independent (Device/Increase time) Left Sidelying to Sit: HOB flat;5: Supervision Left Sidelying to Sit  Details (indicate cue type and reason): Verbal cues to maintain back precautions. Pt requires increased time.  Transfers Transfers: Yes Sit to Stand: From bed;With upper extremity assist;5: Supervision Sit to Stand Details (indicate cue type and reason): Supervision for safety and negotiation of lines.  Stand to Sit: 5: Supervision;With upper extremity assist;To chair/3-in-1;With armrests Stand to Sit Details: Supervision for safety, pt demonstrating correct placement of UEs.  Ambulation/Gait Ambulation/Gait: Yes Ambulation/Gait Assistance: 4: Min assist Ambulation/Gait Assistance Details (indicate cue type and reason): Assist to negotiate tight negotiation in room with RW, pt required min assist for one slight loss of balance with lifting one hand in standing while she tried to readjust lines.  Ambulation Distance (Feet): 40 Feet Assistive device: Rolling walker Gait Pattern: Step-through pattern;Decreased stride length;Trunk flexed Stairs: No    Exercise  General Exercises - Lower Extremity Ankle Circles/Pumps: AROM;Both;Seated;20 reps (Pt reporting "I do these all the time") Long Arc Quad: AROM;Both;20 reps;Seated (manually resisted) Hip Flexion/Marching: AROM;Both;Other reps (comment);Seated (2 set of 15 reps, manually resisted) Toe Raises: AROM;Both;Other reps (comment);Other (comment) (30, manually resisted) Heel Raises: AROM;Both;Other reps (comment);Seated (30 rep, manually resisted) End of Session PT - End of Session Equipment Utilized During Treatment: Gait belt;Back brace Activity Tolerance: Patient tolerated treatment well Patient left: in chair;with call bell in reach Nurse Communication: Mobility status for transfers;Mobility status for ambulation General Behavior During Session: [facility] for tasks performed Cognition: [facility] for tasks performed  Sherie Don 04/11/2011, 6:41 PM  Dahlia Client (Beverely Pace) Carleene Mains PT, DPT Acute Rehabilitation [phone]

## 2011-04-12 NOTE — Progress Notes (Signed)
Physical Therapy Treatment Patient Details Name: [name] MRN: [id number] DOB: 25-Jan-1932 Today's Date: 04/12/2011  PT Assessment/Plan  PT - Assessment/Plan Comments on Treatment Session: Patient is a 78 yo female admitted with increasing back pain, now s/p T4-10 stabilization. Pt with decreased endurance today, less distance than prior to getting C. Diff. Pt is mobilizing well at supervisional level however, will decrease frequency.  PT Plan: Discharge plan remains appropriate;Frequency needs to be updated PT Frequency: Min 4X/week Follow Up Recommendations: Home health PT;Supervision/Assistance - 24 hour Equipment Recommended: None recommended by PT PT Goals  Acute Rehab PT Goals Pt will Roll Supine to Left Side: Independently PT Goal: Rolling Supine to Left Side - Progress: Progressing toward goal Pt will go Supine/Side to Sit: Independently;with HOB 0 degrees PT Goal: Supine/Side to Sit - Progress: Progressing toward goal Pt will go Sit to Stand: with upper extremity assist;with modified independence (progressed as met last session) PT Goal: Sit to Stand - Progress: Progressing toward goal Pt will go Stand to Sit: with upper extremity assist;with modified independence (progressed as met last session) PT Goal: Stand to Sit - Progress: Progressing toward goal Pt will Ambulate: >150 feet;with rolling walker;with modified independence (progressed as met last session) PT Goal: Ambulate - Progress: Progressing toward goal  PT Treatment Precautions/Restrictions  Precautions Precautions: Back Precaution Comments: Pt able to verbalize 3/3 back precautions without need for verbal cues.  Required Braces or Orthoses: Yes Spinal Brace: Thoracolumbosacral orthotic;Applied in sitting position Restrictions Weight Bearing Restrictions: No Mobility (including Balance) Bed Mobility Bed Mobility: Yes Rolling Left: With rail;6: Modified independent (Device/Increase time) Left Sidelying to  Sit: 5: Supervision;HOB elevated (comment degrees) (30) Left Sidelying to Sit Details (indicate cue type and reason): Verbal cues for maintain back precautions.  Transfers Transfers: Yes Sit to Stand: From bed;With upper extremity assist;5: Supervision Sit to Stand Details (indicate cue type and reason): Supervision for safety and negotiation of lines. Verbal cues to push from bed x 1 rep as pt trying to pull on RW. Pt performed x 4 Stand to Sit: 5: Supervision;With upper extremity assist;To chair/3-in-1;With armrests Stand to Sit Details: Verbal cues for safe placement of UEs, pt with decreased control of descent.  Ambulation/Gait Ambulation/Gait: Yes Ambulation/Gait Assistance: Other (comment) (min-guard assist) Ambulation/Gait Assistance Details (indicate cue type and reason): Tactile cues for improved posture.  Ambulation Distance (Feet): 90 Feet Assistive device: Rolling walker Gait Pattern: Step-through pattern;Decreased stride length;Trunk flexed Stairs: No  Posture/Postural Control Posture/Postural Control: No significant limitations Balance Balance Assessed: Yes Static Standing Balance Static Standing - Balance Support: Left upper extremity supported;Right upper extremity supported Static Standing - Level of Assistance: 4: Min assist Static Standing - Comment/# of Minutes: Pt requires RW for support in standing, unable to maintain no UE support secondary to weakness and imbalance.  Exercise  General Exercises - Lower Extremity Ankle Circles/Pumps: AROM;Both;Seated;20 reps Long Arc Quad: AROM;Both;20 reps;Seated (manually resisted) Hip Flexion/Marching: AROM;Both;Seated;Other reps (comment) (25, manually resisted) Heel Raises: AROM;Both;Other reps (comment);Seated (30 rep, manually resisted) End of Session PT - End of Session Equipment Utilized During Treatment: Gait belt;Back brace Activity Tolerance: Patient tolerated treatment well;Patient limited by fatigue (unable to  walk as far as prior to C.Diff secondary tofatigue) Patient left: in chair;with call bell in reach Nurse Communication: Mobility status for transfers;Mobility status for ambulation General Behavior During Session: [facility] for tasks performed Cognition: [facility] for tasks performed  Sherie Don 04/12/2011, 3:30 PM  Sherie Don) Carleene Mains PT, DPT Acute Rehabilitation [phone]

## 2011-04-12 NOTE — Progress Notes (Signed)
Pt has had very little stool collected in flexiseal rectal tube.  She feels well. Currently on 10 day course of Metronidazole that started yesterday evening.    Vital signs reviewed: no fevers, pressures and pulse in normal ranges.  I did not reexamine pt.   Assessment: 1.  C diff colitis.  Improved.  On course of Flagyl.  Plan 1.  Complete 10 ADDITIONAL days of Flagyl AS OUT PATIENT 2.  GI signing off, call us PRN.  She does not need GI follow up unless she is having problems.  She has reiterated that she does not want or need a colonoscopy, though she has never had one.  Jennye Moccasin      PA-C  GI ATTENDING  SEEN AND EXAMINED.AGREE WITH ABOVE. FEELING BETTER. DIARRHEA IMPROVED. NEEDS TO GO HOME ON METRONIDAZOLE 250 MG QID FOR 10 DAYS (IN ADDITION TO INPT COURSE). NO GI FOLLOW UP NEEDED UNLESS SHE HAS PROBLEMS. DISCUSSED WITH PATIENT. WILL SIGN OFF. THANKS   John N. Eda Keys., M.D. [facility] Division of Gastroenterology

## 2011-04-12 NOTE — Progress Notes (Signed)
Subjective: Patient reports She's feeling better today less he still has a fair amount back pain. She denies any leg pain feels like her legs getting stronger.  Objective: Vital signs in last 24 hours: Temp:  [97.6 F (36.4 C)-98.8 F (37.1 C)] 97.6 F (36.4 C) (01/23 0700) Pulse Rate:  [60-84] 66  (01/23 0800) Resp:  [8-23] 20  (01/23 0800) BP: (86-159)/(36-91) 151/52 mmHg (01/23 0800) SpO2:  [91 %-100 %] 100 % (01/23 0800)  Intake/Output from previous day: 01/22 0701 - 01/23 0700 In: 1468.8 [I.V.:1468.8] Out: 1450 [Urine:1450] Intake/Output this shift: Total I/O In: 75 [I.V.:75] Out: -   Strength is 5 out of 5 in her lower extremities wound is clean and dry she is awake and alert  Lab Results:  [facility] 04/11/11 0921  WBC 7.8  HGB 8.3*  HCT 25.5*  PLT 317   BMET  Basename 04/11/11 0921  NA 138  K 3.7  CL 101  CO2 28  GLUCOSE 120*  BUN 21  CREATININE 1.13*  CALCIUM 8.7    Studies/Results: No results found.  Assessment/Plan: 78 year old female postop day 5 thoracic stabilization. Overall she's making good progress. Problem list:   #1 status post thoracic stabilization progressively mobilizing with physical therapy.  #2 clostridium difficile diarrhea improved now on Flagyl with rectal Foley.  #3  LOS: 5 days     Rainee Sweatt P 04/12/2011, 8:19 AM

## 2011-04-13 NOTE — Progress Notes (Signed)
Subjective: Patient reports Patient oh [name] pain is well and controlled and improved from yesterday is no longer having any diarrhea  Objective: Vital signs in last 24 hours: Temp:  [97.4 F (36.3 C)-98 F (36.7 C)] 97.4 F (36.3 C) (01/24 0600) Pulse Rate:  [62-94] 62  (01/24 0600) Resp:  [15-25] 19  (01/24 0600) BP: (98-157)/(43-116) 112/73 mmHg (01/24 0600) SpO2:  [93 %-100 %] 95 % (01/24 0600)  Intake/Output from previous day: 01/23 0701 - 01/24 0700 In: 870 [P.O.:120; I.V.:750] Out: 1000 [Urine:900; Stool:100] Intake/Output this shift:    Strength 5 out of 5 wound clean and dry  Lab Results:  [facility] 04/11/11 0921  WBC 7.8  HGB 8.3*  HCT 25.5*  PLT 317   BMET  Basename 04/11/11 0921  NA 138  K 3.7  CL 101  CO2 28  GLUCOSE 120*  BUN 21  CREATININE 1.13*  CALCIUM 8.7    Studies/Results: No results found.  Assessment/Plan: Progressive mobilization with physical therapy due to DC the rectal and Foley catheters.  LOS: 6 days     [name] P 04/13/2011, 7:34 AM

## 2011-04-13 NOTE — Progress Notes (Signed)
Utilization review completed. Olyver Hawes, RN, BSN. 04/13/11 

## 2011-04-13 NOTE — Progress Notes (Signed)
Physical Therapy Treatment Patient Details Name: TALESHIA LUFF MRN: [id number] DOB: 01-20-1932 Today's Date: 04/13/2011  PT Assessment/Plan  PT - Assessment/Plan Comments on Treatment Session: Pt required no verbal cues today to maintain precautions or for safety. PT Plan: Discharge plan remains appropriate Follow Up Recommendations: Home health PT;Supervision/Assistance - 24 hour Equipment Recommended: None recommended by PT PT Goals  Acute Rehab PT Goals PT Goal: Rolling Supine to Right Side - Progress: Progressing toward goal PT Goal: Rolling Supine to Left Side - Progress: Progressing toward goal PT Goal: Supine/Side to Sit - Progress: Progressing toward goal PT Goal: Sit to Supine/Side - Progress: Progressing toward goal PT Goal: Sit to Stand - Progress: Progressing toward goal PT Goal: Stand to Sit - Progress: Progressing toward goal PT Goal: Ambulate - Progress: Progressing toward goal  PT Treatment Precautions/Restrictions  Precautions Precautions: Back Precaution Comments: Pt able to verbalize 3/3 back precautions without need for verbal cues.  Required Braces or Orthoses: Yes Spinal Brace: Thoracolumbosacral orthotic;Applied in sitting position Restrictions Weight Bearing Restrictions: No Mobility (including Balance) Bed Mobility Bed Mobility: Yes Rolling Left: With rail;6: Modified independent (Device/Increase time) Left Sidelying to Sit: HOB flat;5: Supervision;With rails Sitting - Scoot to Edge of Bed: 5: Supervision Transfers Transfers: Yes Sit to Stand: From bed;With upper extremity assist;5: Supervision Stand to Sit: 5: Supervision;With upper extremity assist;To chair/3-in-1;With armrests Ambulation/Gait Ambulation/Gait: Yes Ambulation/Gait Assistance: 5: Supervision Ambulation Distance (Feet): 120 Feet Assistive device: Rolling walker Gait Pattern: Step-through pattern;Decreased stride length;Trunk flexed Stairs: No    Exercise    End of  Session PT - End of Session Equipment Utilized During Treatment: Gait belt;Back brace Activity Tolerance: Patient tolerated treatment well Patient left: in chair;with call bell in reach Nurse Communication: Mobility status for transfers;Mobility status for ambulation General Behavior During Session: [facility] for tasks performed Cognition: [facility] for tasks performed  Newell Coral 04/13/2011, 12:47 PM  Newell Coral, PTA [phone]

## 2011-04-14 MED ORDER — HYDROMORPHONE HCL 2 MG PO TABS: 2.0000 mg | ORAL_TABLET | ORAL | Status: AC | PRN

## 2011-04-14 MED ORDER — DIAZEPAM 5 MG PO TABS: 5.0000 mg | ORAL_TABLET | Freq: Four times a day (QID) | ORAL | Status: AC | PRN

## 2011-04-14 MED ORDER — METRONIDAZOLE 250 MG PO TABS: 250.0000 mg | ORAL_TABLET | Freq: Four times a day (QID) | ORAL | Status: AC

## 2011-04-14 MED FILL — Sodium Chloride IV Soln 0.9%: INTRAVENOUS | Qty: 2000 | Status: AC

## 2011-04-14 MED FILL — Heparin Sodium (Porcine) Inj 1000 Unit/ML: INTRAMUSCULAR | Qty: 30 | Status: AC

## 2011-04-14 NOTE — Discharge Summary (Signed)
Physician Discharge Summary  Patient ID: [name] MRN: [id number] DOB/AGE: [birth year] 76 y.o.  Admit date: 04/07/2011 Discharge date: 04/14/2011  Admission Diagnoses: T7-8 instability  Discharge Diagnoses: Same Active Problems:  Clostridium difficile diarrhea   Discharged Condition: good  Hospital Course: Patient was brought into the hospital underwent a T4-T10 stabilization postoperatively patient did very well with recovered in the ICU in the ICU patient was observed and progressed well over the next several days. Developed some progressive diarrhea that was worked up it was Clostridium difficile toxin positive and was started on Flagyl after being seen by gastroenterology to patient he will to physical therapy was still be discharged home on postop day 7. She'll be discharged on Dilaudid value for pain and muscle relaxation and slight loss Flagyl for her infection. She is scheduled followup in the approximate one to 2 weeks.  Consults: Significant Diagnostic Studies: Treatments: Discharge Exam: Blood pressure 106/53, pulse 71, temperature 97.9 F (36.6 C), temperature source Oral, resp. rate 18, height 5\' 2"  (1.575 m), weight 77.7 kg (171 lb 4.8 oz), SpO2 98.00%. Patient is neurologically intact 5 out of 5 strength in her lower extremities her wound is clean and dry.  Disposition: Home   Medication List  As of 04/14/2011  2:50 PM   TAKE these medications         aspirin EC 81 MG tablet   Take 81 mg by mouth at bedtime.      carisoprodol 350 MG tablet   Commonly known as: SOMA   Take 350 mg by mouth 4 (four) times daily as needed. For muscle spasm      diazepam 5 MG tablet   Commonly known as: VALIUM   Take 1 tablet (5 mg total) by mouth every 6 (six) hours as needed for anxiety.      diclofenac 50 MG EC tablet   Commonly known as: VOLTAREN   Take 50 mg by mouth 2 (two) times daily.      docusate sodium 100 MG capsule   Commonly known as: COLACE   Take 100 mg  by mouth at bedtime.      famotidine 20 MG tablet   Commonly known as: PEPCID   Take 20 mg by mouth 2 (two) times daily.      flecainide 100 MG tablet   Commonly known as: TAMBOCOR   Take 50-100 mg by mouth 2 (two) times daily. Take 1/2 tab in the am & 1 tab in the pm      HYDROmorphone 2 MG tablet   Commonly known as: DILAUDID   Take 2-4 mg by mouth every 4 (four) hours as needed. For pain      HYDROmorphone 2 MG tablet   Commonly known as: DILAUDID   Take 1-2 tablets (2-4 mg total) by mouth every 4 (four) hours as needed.      metroNIDAZOLE 250 MG tablet   Commonly known as: FLAGYL   Take 1 tablet (250 mg total) by mouth every 6 (six) hours.      OVER THE COUNTER MEDICATION   Take 1 tablet by mouth every morning. Medication:Vitamin C/ with Vitamin D supplement      oxyCODONE-acetaminophen 10-325 MG per tablet   Commonly known as: PERCOCET   Take 1-2 tablets by mouth every 4 (four) hours as needed. For pain      TRIBENZOR 40-10-25 MG Tabs   Generic drug: Olmesartan-Amlodipine-HCTZ   Take 1 tablet by mouth daily.  Signed: [name] P 04/14/2011, 2:50 PM

## 2011-04-14 NOTE — Progress Notes (Signed)
Physical Therapy Treatment Patient Details Name: [name] MRN: [id number] DOB: Sep 28, 1931 Today's Date: 04/14/2011  PT Assessment/Plan  PT - Assessment/Plan Comments on Treatment Session: Pt still requires cueing for safety with RW distance. Pt is safe to go home with supervision PT Plan: Discharge plan remains appropriate PT Frequency: Min 4X/week Follow Up Recommendations: Home health PT;Supervision/Assistance - 24 hour Equipment Recommended: None recommended by PT PT Goals  Acute Rehab PT Goals PT Goal Formulation: With patient PT Goal: Rolling Supine to Right Side - Progress: Progressing toward goal PT Goal: Rolling Supine to Left Side - Progress: Progressing toward goal PT Goal: Supine/Side to Sit - Progress: Progressing toward goal PT Goal: Sit to Supine/Side - Progress: Progressing toward goal PT Goal: Sit to Stand - Progress: Progressing toward goal PT Goal: Stand to Sit - Progress: Progressing toward goal PT Goal: Ambulate - Progress: Progressing toward goal  PT Treatment Precautions/Restrictions  Precautions Precautions: Back Precaution Comments: Pt able to verbalize 3/3 back precautions without need for verbal cues.  Required Braces or Orthoses: Yes Spinal Brace: Thoracolumbosacral orthotic;Applied in sitting position Restrictions Weight Bearing Restrictions: No Mobility (including Balance) Bed Mobility Bed Mobility: Yes Sit to Supine: 6: Modified independent (Device/Increase time) Transfers Transfers: Yes Sit to Stand: 5: Supervision;With upper extremity assist;From chair/3-in-1 Sit to Stand Details (indicate cue type and reason): VC for hand placement and safety to RW Stand to Sit: 5: Supervision;With upper extremity assist;To bed Stand to Sit Details: VC for hand placement secondary to pt with impulsivity to just sit down holding onto RW Ambulation/Gait Ambulation/Gait: Yes Ambulation/Gait Assistance: 5: Supervision Ambulation/Gait Assistance Details  (indicate cue type and reason): Cues throughout for increased posture as well as for distance to RW for safety. Ambulation Distance (Feet): 200 Feet Assistive device: Rolling walker Gait Pattern: Step-through pattern;Decreased stride length;Trunk flexed Gait velocity: Decreased gait speed Stairs: No    Exercise    End of Session PT - End of Session Equipment Utilized During Treatment: Gait belt;Back brace Activity Tolerance: Patient tolerated treatment well Patient left: with call bell in reach;in bed Nurse Communication: Mobility status for transfers;Mobility status for ambulation General Behavior During Session: [facility] for tasks performed Cognition: [facility] for tasks performed  Milana Kidney 04/14/2011, 3:29 PM  04/14/2011 Milana Kidney DPT PAGER: [phone] OFFICE: [phone]

## 2011-04-15 NOTE — Progress Notes (Signed)
Patient ID: [name], female   DOB: 01/23/1932, 78 y.o.   MRN: [id number] Subjective:  The patient is alert and pleasant. She was ready to go home yesterday but could not make it home because of the inclement weather. She wants to go home today.  Objective: Vital signs in last 24 hours: Temp:  [97.4 F (36.3 C)-98.3 F (36.8 C)] 97.4 F (36.3 C) (01/26 0521) Pulse Rate:  [60-71] 66  (01/26 0521) Resp:  [18] 18  (01/26 0521) BP: (106-165)/(52-72) 132/61 mmHg (01/26 0521) SpO2:  [95 %-98 %] 95 % (01/26 0521)  Intake/Output from previous day: 01/25 0701 - 01/26 0700 In: -  Out: 600 [Urine:600] Intake/Output this shift: Total I/O In: 360 [P.O.:360] Out: -   Physical exam patient is alert and oriented.  Lab Results: No results found for this basename: WBC:2,HGB:2,HCT:2,PLT:2 in the last 72 hours BMET No results found for this basename: NA:2,K:2,CL:2,CO2:2,GLUCOSE:2,BUN:2,CREATININE:2,CALCIUM:2 in the last 72 hours  Studies/Results: No results found.  Assessment/Plan: Postop day #8: Patient is ready for discharge. Questions were answered. Instructions were given.  C. difficile colitis: Patient is being discharged with Flagyl.  LOS: 8 days     Donnis Phaneuf D 04/15/2011, 9:19 AM

## 2011-04-15 NOTE — Progress Notes (Signed)
Patient discharged home with prescriptions, activity instructions and follow up appointment information.  Patient has no questions at this time.  Patient left unit in wheelchair, wearing brace, in stable condition with personal belongings.  Osvaldo Angst, RN-------------

## 2011-05-18 ENCOUNTER — Other Ambulatory Visit: Payer: Self-pay | Admitting: Neurosurgery

## 2011-05-18 ENCOUNTER — Ambulatory Visit
Admission: RE | Admit: 2011-05-18 | Discharge: 2011-05-18 | Disposition: A | Discharge status: Home / Self Care | Payer: Medicare Other | Source: Ambulatory Visit | Attending: Neurosurgery | Admitting: Neurosurgery

## 2011-05-18 DIAGNOSIS — M5124 Other intervertebral disc displacement, thoracic region: Secondary | ICD-10-CM

## 2011-05-18 DIAGNOSIS — M4804 Spinal stenosis, thoracic region: Secondary | ICD-10-CM

## 2011-05-18 DIAGNOSIS — M545 Low back pain: Secondary | ICD-10-CM

## 2011-05-18 DIAGNOSIS — M539 Dorsopathy, unspecified: Secondary | ICD-10-CM | POA: Diagnosis not present

## 2011-05-18 DIAGNOSIS — M549 Dorsalgia, unspecified: Secondary | ICD-10-CM | POA: Diagnosis not present

## 2011-05-19 DIAGNOSIS — I4891 Unspecified atrial fibrillation: Secondary | ICD-10-CM | POA: Diagnosis not present

## 2011-05-19 DIAGNOSIS — I517 Cardiomegaly: Secondary | ICD-10-CM | POA: Diagnosis not present

## 2011-05-19 DIAGNOSIS — I359 Nonrheumatic aortic valve disorder, unspecified: Secondary | ICD-10-CM | POA: Diagnosis not present

## 2011-05-19 DIAGNOSIS — I059 Rheumatic mitral valve disease, unspecified: Secondary | ICD-10-CM | POA: Diagnosis not present

## 2011-06-01 DIAGNOSIS — Z950 Presence of cardiac pacemaker: Secondary | ICD-10-CM | POA: Diagnosis not present

## 2011-08-24 DIAGNOSIS — M545 Low back pain: Secondary | ICD-10-CM | POA: Diagnosis not present

## 2011-08-24 DIAGNOSIS — M869 Osteomyelitis, unspecified: Secondary | ICD-10-CM | POA: Diagnosis not present

## 2011-08-29 DIAGNOSIS — D510 Vitamin B12 deficiency anemia due to intrinsic factor deficiency: Secondary | ICD-10-CM | POA: Diagnosis not present

## 2011-08-29 DIAGNOSIS — M1990 Unspecified osteoarthritis, unspecified site: Secondary | ICD-10-CM | POA: Diagnosis not present

## 2011-08-29 DIAGNOSIS — D509 Iron deficiency anemia, unspecified: Secondary | ICD-10-CM | POA: Diagnosis not present

## 2011-08-29 DIAGNOSIS — M5137 Other intervertebral disc degeneration, lumbosacral region: Secondary | ICD-10-CM | POA: Diagnosis not present

## 2011-08-29 DIAGNOSIS — I10 Essential (primary) hypertension: Secondary | ICD-10-CM | POA: Diagnosis not present

## 2011-08-29 DIAGNOSIS — Z6831 Body mass index (BMI) 31.0-31.9, adult: Secondary | ICD-10-CM | POA: Diagnosis not present

## 2011-09-05 DIAGNOSIS — I498 Other specified cardiac arrhythmias: Secondary | ICD-10-CM | POA: Diagnosis not present

## 2011-10-17 DIAGNOSIS — K219 Gastro-esophageal reflux disease without esophagitis: Secondary | ICD-10-CM | POA: Diagnosis not present

## 2011-10-17 DIAGNOSIS — D649 Anemia, unspecified: Secondary | ICD-10-CM | POA: Diagnosis not present

## 2011-10-18 DIAGNOSIS — I4891 Unspecified atrial fibrillation: Secondary | ICD-10-CM | POA: Diagnosis not present

## 2011-10-18 DIAGNOSIS — I059 Rheumatic mitral valve disease, unspecified: Secondary | ICD-10-CM | POA: Diagnosis not present

## 2011-10-18 DIAGNOSIS — I517 Cardiomegaly: Secondary | ICD-10-CM | POA: Diagnosis not present

## 2011-10-18 DIAGNOSIS — Z950 Presence of cardiac pacemaker: Secondary | ICD-10-CM | POA: Diagnosis not present

## 2011-10-27 DIAGNOSIS — K5730 Diverticulosis of large intestine without perforation or abscess without bleeding: Secondary | ICD-10-CM | POA: Diagnosis not present

## 2011-10-27 DIAGNOSIS — K2960 Other gastritis without bleeding: Secondary | ICD-10-CM | POA: Diagnosis not present

## 2011-10-27 DIAGNOSIS — D62 Acute posthemorrhagic anemia: Secondary | ICD-10-CM | POA: Diagnosis not present

## 2011-10-27 DIAGNOSIS — K2990 Gastroduodenitis, unspecified, without bleeding: Secondary | ICD-10-CM | POA: Diagnosis not present

## 2011-10-27 DIAGNOSIS — K648 Other hemorrhoids: Secondary | ICD-10-CM | POA: Diagnosis not present

## 2011-10-27 DIAGNOSIS — D500 Iron deficiency anemia secondary to blood loss (chronic): Secondary | ICD-10-CM | POA: Diagnosis not present

## 2011-10-27 DIAGNOSIS — K2900 Acute gastritis without bleeding: Secondary | ICD-10-CM | POA: Diagnosis not present

## 2011-10-27 DIAGNOSIS — K259 Gastric ulcer, unspecified as acute or chronic, without hemorrhage or perforation: Secondary | ICD-10-CM | POA: Diagnosis not present

## 2011-10-27 DIAGNOSIS — K2970 Gastritis, unspecified, without bleeding: Secondary | ICD-10-CM | POA: Diagnosis not present

## 2011-10-27 DIAGNOSIS — R195 Other fecal abnormalities: Secondary | ICD-10-CM | POA: Diagnosis not present

## 2011-10-27 DIAGNOSIS — Z1211 Encounter for screening for malignant neoplasm of colon: Secondary | ICD-10-CM | POA: Diagnosis not present

## 2011-10-27 DIAGNOSIS — K253 Acute gastric ulcer without hemorrhage or perforation: Secondary | ICD-10-CM | POA: Diagnosis not present

## 2011-10-27 DIAGNOSIS — K219 Gastro-esophageal reflux disease without esophagitis: Secondary | ICD-10-CM | POA: Diagnosis not present

## 2011-10-31 DIAGNOSIS — D649 Anemia, unspecified: Secondary | ICD-10-CM | POA: Diagnosis not present

## 2011-10-31 DIAGNOSIS — Z6830 Body mass index (BMI) 30.0-30.9, adult: Secondary | ICD-10-CM | POA: Diagnosis not present

## 2011-10-31 DIAGNOSIS — I10 Essential (primary) hypertension: Secondary | ICD-10-CM | POA: Diagnosis not present

## 2011-11-01 DIAGNOSIS — I4891 Unspecified atrial fibrillation: Secondary | ICD-10-CM | POA: Diagnosis not present

## 2011-11-01 DIAGNOSIS — I359 Nonrheumatic aortic valve disorder, unspecified: Secondary | ICD-10-CM | POA: Diagnosis not present

## 2011-11-01 DIAGNOSIS — I059 Rheumatic mitral valve disease, unspecified: Secondary | ICD-10-CM | POA: Diagnosis not present

## 2011-11-02 DIAGNOSIS — Z23 Encounter for immunization: Secondary | ICD-10-CM | POA: Diagnosis not present

## 2011-11-16 DIAGNOSIS — D649 Anemia, unspecified: Secondary | ICD-10-CM | POA: Diagnosis not present

## 2011-11-21 DIAGNOSIS — Z6830 Body mass index (BMI) 30.0-30.9, adult: Secondary | ICD-10-CM | POA: Diagnosis not present

## 2011-11-21 DIAGNOSIS — I10 Essential (primary) hypertension: Secondary | ICD-10-CM | POA: Diagnosis not present

## 2011-11-24 DIAGNOSIS — D631 Anemia in chronic kidney disease: Secondary | ICD-10-CM | POA: Diagnosis not present

## 2011-11-24 DIAGNOSIS — N189 Chronic kidney disease, unspecified: Secondary | ICD-10-CM | POA: Diagnosis not present

## 2011-11-28 DIAGNOSIS — D649 Anemia, unspecified: Secondary | ICD-10-CM | POA: Diagnosis not present

## 2011-11-28 DIAGNOSIS — I10 Essential (primary) hypertension: Secondary | ICD-10-CM | POA: Diagnosis not present

## 2011-11-30 DIAGNOSIS — M545 Low back pain: Secondary | ICD-10-CM | POA: Diagnosis not present

## 2011-11-30 DIAGNOSIS — M546 Pain in thoracic spine: Secondary | ICD-10-CM | POA: Diagnosis not present

## 2011-12-11 DIAGNOSIS — I499 Cardiac arrhythmia, unspecified: Secondary | ICD-10-CM | POA: Diagnosis not present

## 2012-04-02 DIAGNOSIS — M546 Pain in thoracic spine: Secondary | ICD-10-CM | POA: Diagnosis not present

## 2012-04-02 DIAGNOSIS — M545 Low back pain: Secondary | ICD-10-CM | POA: Diagnosis not present

## 2012-04-18 DIAGNOSIS — N039 Chronic nephritic syndrome with unspecified morphologic changes: Secondary | ICD-10-CM | POA: Diagnosis not present

## 2012-04-18 DIAGNOSIS — D631 Anemia in chronic kidney disease: Secondary | ICD-10-CM | POA: Diagnosis not present

## 2012-04-18 DIAGNOSIS — N189 Chronic kidney disease, unspecified: Secondary | ICD-10-CM | POA: Diagnosis not present

## 2012-05-15 DIAGNOSIS — I4891 Unspecified atrial fibrillation: Secondary | ICD-10-CM | POA: Diagnosis not present

## 2012-05-15 DIAGNOSIS — I517 Cardiomegaly: Secondary | ICD-10-CM | POA: Diagnosis not present

## 2012-05-15 DIAGNOSIS — I498 Other specified cardiac arrhythmias: Secondary | ICD-10-CM | POA: Diagnosis not present

## 2012-05-15 DIAGNOSIS — I359 Nonrheumatic aortic valve disorder, unspecified: Secondary | ICD-10-CM | POA: Diagnosis not present

## 2012-05-15 DIAGNOSIS — I119 Hypertensive heart disease without heart failure: Secondary | ICD-10-CM | POA: Diagnosis not present

## 2012-05-20 DIAGNOSIS — I119 Hypertensive heart disease without heart failure: Secondary | ICD-10-CM | POA: Diagnosis not present

## 2012-05-29 DIAGNOSIS — Z6831 Body mass index (BMI) 31.0-31.9, adult: Secondary | ICD-10-CM | POA: Diagnosis not present

## 2012-05-29 DIAGNOSIS — I4891 Unspecified atrial fibrillation: Secondary | ICD-10-CM | POA: Diagnosis not present

## 2012-05-29 DIAGNOSIS — I10 Essential (primary) hypertension: Secondary | ICD-10-CM | POA: Diagnosis not present

## 2012-05-31 DIAGNOSIS — Z1231 Encounter for screening mammogram for malignant neoplasm of breast: Secondary | ICD-10-CM | POA: Diagnosis not present

## 2012-06-20 DIAGNOSIS — D631 Anemia in chronic kidney disease: Secondary | ICD-10-CM | POA: Diagnosis not present

## 2012-06-20 DIAGNOSIS — N189 Chronic kidney disease, unspecified: Secondary | ICD-10-CM | POA: Diagnosis not present

## 2012-06-25 DIAGNOSIS — M5137 Other intervertebral disc degeneration, lumbosacral region: Secondary | ICD-10-CM | POA: Diagnosis not present

## 2012-06-25 DIAGNOSIS — M545 Low back pain: Secondary | ICD-10-CM | POA: Diagnosis not present

## 2012-06-25 DIAGNOSIS — M48061 Spinal stenosis, lumbar region without neurogenic claudication: Secondary | ICD-10-CM | POA: Diagnosis not present

## 2012-06-25 DIAGNOSIS — M546 Pain in thoracic spine: Secondary | ICD-10-CM | POA: Diagnosis not present

## 2012-06-25 DIAGNOSIS — M869 Osteomyelitis, unspecified: Secondary | ICD-10-CM | POA: Diagnosis not present

## 2012-06-25 DIAGNOSIS — Z006 Encounter for examination for normal comparison and control in clinical research program: Secondary | ICD-10-CM | POA: Diagnosis not present

## 2012-06-25 DIAGNOSIS — IMO0002 Reserved for concepts with insufficient information to code with codable children: Secondary | ICD-10-CM | POA: Diagnosis not present

## 2012-07-11 ENCOUNTER — Other Ambulatory Visit: Payer: Self-pay | Admitting: Neurosurgery

## 2012-07-11 DIAGNOSIS — M5137 Other intervertebral disc degeneration, lumbosacral region: Secondary | ICD-10-CM | POA: Diagnosis not present

## 2012-07-11 DIAGNOSIS — IMO0002 Reserved for concepts with insufficient information to code with codable children: Secondary | ICD-10-CM

## 2012-07-17 ENCOUNTER — Ambulatory Visit
Admission: RE | Admit: 2012-07-17 | Discharge: 2012-07-17 | Disposition: A | Discharge status: Home / Self Care | Payer: Medicare Other | Source: Ambulatory Visit | Attending: Neurosurgery | Admitting: Neurosurgery

## 2012-07-17 DIAGNOSIS — M539 Dorsopathy, unspecified: Secondary | ICD-10-CM | POA: Diagnosis not present

## 2012-07-17 DIAGNOSIS — IMO0002 Reserved for concepts with insufficient information to code with codable children: Secondary | ICD-10-CM

## 2012-07-18 DIAGNOSIS — M545 Low back pain: Secondary | ICD-10-CM | POA: Diagnosis not present

## 2012-07-18 DIAGNOSIS — M546 Pain in thoracic spine: Secondary | ICD-10-CM | POA: Diagnosis not present

## 2012-08-13 DIAGNOSIS — Z6830 Body mass index (BMI) 30.0-30.9, adult: Secondary | ICD-10-CM | POA: Diagnosis not present

## 2012-08-13 DIAGNOSIS — J209 Acute bronchitis, unspecified: Secondary | ICD-10-CM | POA: Diagnosis not present

## 2012-09-03 DIAGNOSIS — M25519 Pain in unspecified shoulder: Secondary | ICD-10-CM | POA: Diagnosis not present

## 2012-09-24 DIAGNOSIS — M4120 Other idiopathic scoliosis, site unspecified: Secondary | ICD-10-CM | POA: Diagnosis not present

## 2012-09-24 DIAGNOSIS — M545 Low back pain: Secondary | ICD-10-CM | POA: Diagnosis not present

## 2012-09-24 DIAGNOSIS — M5126 Other intervertebral disc displacement, lumbar region: Secondary | ICD-10-CM | POA: Insufficient documentation

## 2012-09-24 DIAGNOSIS — M546 Pain in thoracic spine: Secondary | ICD-10-CM | POA: Diagnosis not present

## 2012-09-24 HISTORY — DX: Other intervertebral disc displacement, lumbar region: M51.26

## 2012-10-15 ENCOUNTER — Other Ambulatory Visit: Payer: Self-pay | Admitting: Orthopedic Surgery

## 2012-10-15 DIAGNOSIS — M25512 Pain in left shoulder: Secondary | ICD-10-CM

## 2012-10-15 DIAGNOSIS — M25519 Pain in unspecified shoulder: Secondary | ICD-10-CM | POA: Diagnosis not present

## 2012-10-17 ENCOUNTER — Ambulatory Visit
Admission: RE | Admit: 2012-10-17 | Discharge: 2012-10-17 | Disposition: A | Discharge status: Home / Self Care | Payer: Medicare Other | Source: Ambulatory Visit | Attending: Orthopedic Surgery | Admitting: Orthopedic Surgery

## 2012-10-17 DIAGNOSIS — M19019 Primary osteoarthritis, unspecified shoulder: Secondary | ICD-10-CM | POA: Diagnosis not present

## 2012-10-17 DIAGNOSIS — M25512 Pain in left shoulder: Secondary | ICD-10-CM

## 2012-10-17 DIAGNOSIS — M25519 Pain in unspecified shoulder: Secondary | ICD-10-CM | POA: Diagnosis not present

## 2012-10-17 MED ORDER — IOHEXOL 180 MG/ML  SOLN
13.0000 mL | Freq: Once | INTRAMUSCULAR | Status: AC | PRN
  Administered 2012-10-17: 13 mL via INTRA_ARTICULAR

## 2012-10-24 DIAGNOSIS — M75120 Complete rotator cuff tear or rupture of unspecified shoulder, not specified as traumatic: Secondary | ICD-10-CM | POA: Diagnosis not present

## 2012-11-05 DIAGNOSIS — Z9181 History of falling: Secondary | ICD-10-CM | POA: Diagnosis not present

## 2012-11-05 DIAGNOSIS — I4891 Unspecified atrial fibrillation: Secondary | ICD-10-CM | POA: Diagnosis not present

## 2012-11-05 DIAGNOSIS — Z1331 Encounter for screening for depression: Secondary | ICD-10-CM | POA: Diagnosis not present

## 2012-11-05 DIAGNOSIS — I10 Essential (primary) hypertension: Secondary | ICD-10-CM | POA: Diagnosis not present

## 2012-11-05 DIAGNOSIS — Z6830 Body mass index (BMI) 30.0-30.9, adult: Secondary | ICD-10-CM | POA: Diagnosis not present

## 2012-11-05 DIAGNOSIS — D649 Anemia, unspecified: Secondary | ICD-10-CM | POA: Diagnosis not present

## 2012-11-15 DIAGNOSIS — I517 Cardiomegaly: Secondary | ICD-10-CM | POA: Diagnosis not present

## 2012-11-15 DIAGNOSIS — R42 Dizziness and giddiness: Secondary | ICD-10-CM | POA: Diagnosis not present

## 2012-11-15 DIAGNOSIS — I119 Hypertensive heart disease without heart failure: Secondary | ICD-10-CM | POA: Diagnosis not present

## 2012-11-15 DIAGNOSIS — I359 Nonrheumatic aortic valve disorder, unspecified: Secondary | ICD-10-CM | POA: Diagnosis not present

## 2012-11-15 DIAGNOSIS — I4891 Unspecified atrial fibrillation: Secondary | ICD-10-CM | POA: Diagnosis not present

## 2012-11-15 DIAGNOSIS — Z950 Presence of cardiac pacemaker: Secondary | ICD-10-CM | POA: Diagnosis not present

## 2012-11-21 DIAGNOSIS — M75120 Complete rotator cuff tear or rupture of unspecified shoulder, not specified as traumatic: Secondary | ICD-10-CM | POA: Diagnosis not present

## 2012-12-03 DIAGNOSIS — Z23 Encounter for immunization: Secondary | ICD-10-CM | POA: Diagnosis not present

## 2012-12-19 DIAGNOSIS — M4120 Other idiopathic scoliosis, site unspecified: Secondary | ICD-10-CM | POA: Diagnosis not present

## 2012-12-19 DIAGNOSIS — M75120 Complete rotator cuff tear or rupture of unspecified shoulder, not specified as traumatic: Secondary | ICD-10-CM | POA: Diagnosis not present

## 2013-01-14 DIAGNOSIS — M4120 Other idiopathic scoliosis, site unspecified: Secondary | ICD-10-CM | POA: Diagnosis not present

## 2013-01-20 DIAGNOSIS — M75120 Complete rotator cuff tear or rupture of unspecified shoulder, not specified as traumatic: Secondary | ICD-10-CM | POA: Diagnosis not present

## 2013-01-22 DIAGNOSIS — I359 Nonrheumatic aortic valve disorder, unspecified: Secondary | ICD-10-CM | POA: Diagnosis not present

## 2013-01-22 DIAGNOSIS — I10 Essential (primary) hypertension: Secondary | ICD-10-CM | POA: Diagnosis not present

## 2013-01-22 DIAGNOSIS — I4891 Unspecified atrial fibrillation: Secondary | ICD-10-CM | POA: Diagnosis not present

## 2013-01-22 DIAGNOSIS — I059 Rheumatic mitral valve disease, unspecified: Secondary | ICD-10-CM | POA: Diagnosis not present

## 2013-01-23 IMAGING — CT CT T SPINE W/O CM
3 of 10 series · 11 of 35 positions shown, 13 images · non-contrast
Comparison: Lateral thoracolumbar spine radiograph 02/07/2011.  CT
myelogram 12/27/2010.

CLINICAL DATA: Mid thoracic back pain.  Status post multiple
thoracic spine surgeries.

CT THORACIC SPINE WITHOUT CONTRAST
TECHNIQUE: Multidetector CT imaging of the thoracic spine was
performed without intravenous contrast administration. Multiplanar
CT image reconstructions were also generated

[Series 7: 2mm soft tissue · axial · 0.33mm/px · z∈[+1216,+1368]mm · 3 of 152 slices shown]
[im 38/152  soft-tissue]
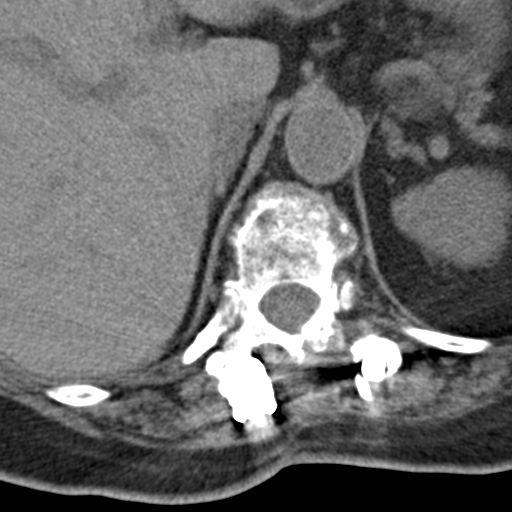
[im 76/152  soft-tissue]
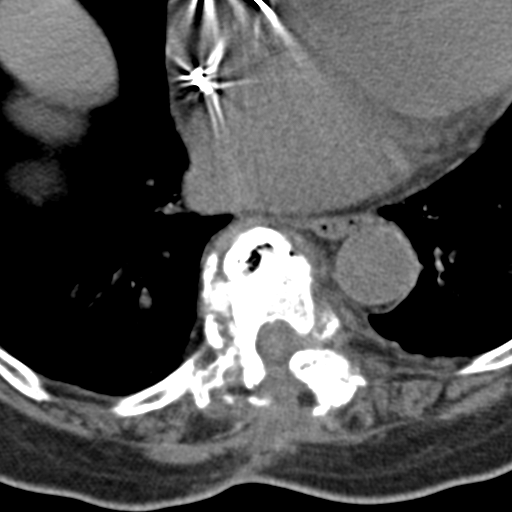
[im 114/152  soft-tissue]
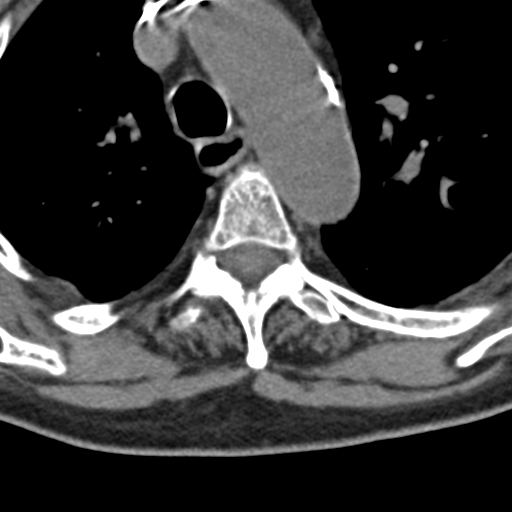

[Series 606: orthog lower · axial · 0.59mm/px · z∈[+1164,+1340]mm · 3 of 89 slices shown, 4 images]
[im 1/89  soft-tissue]
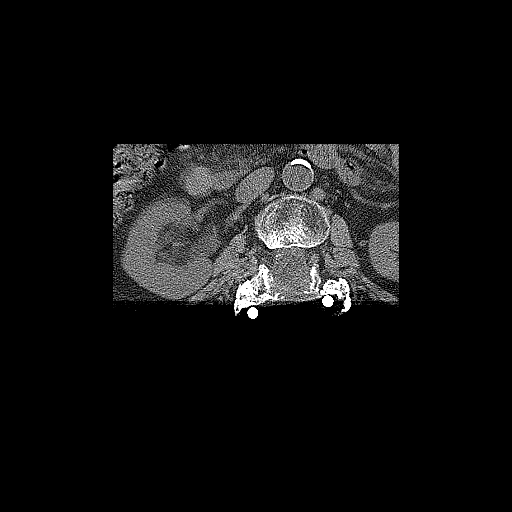
[im 1/89  bone]
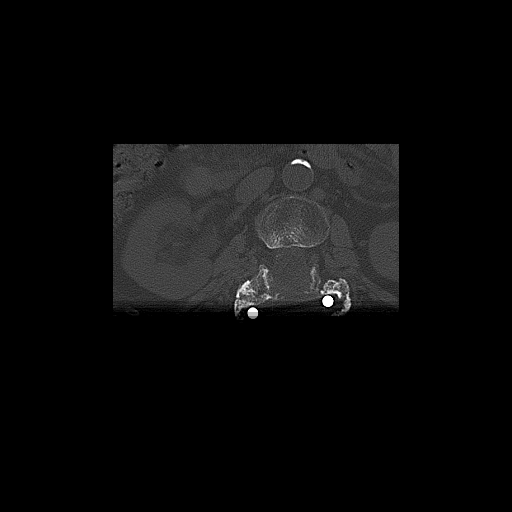
[im 45/89  bone]
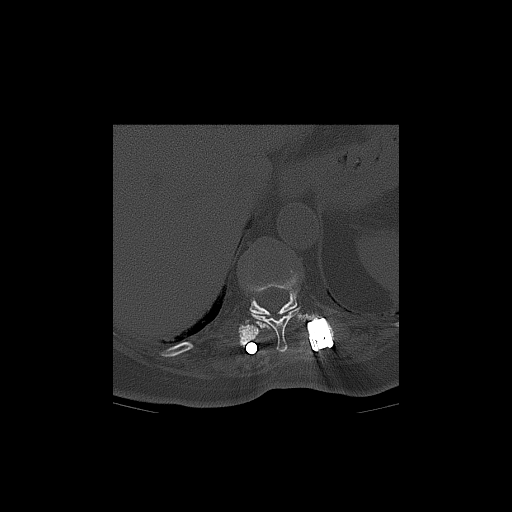
[im 89/89  bone]
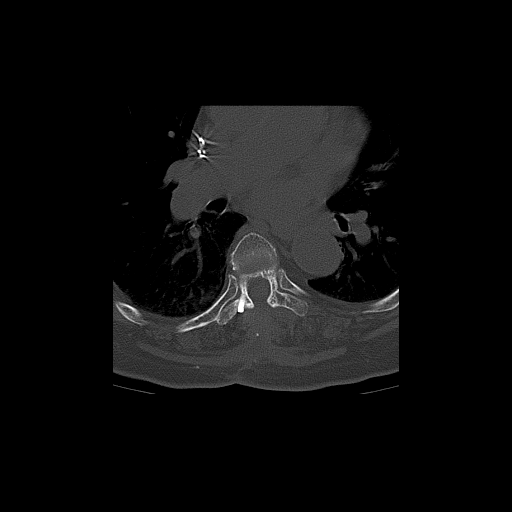

[Series 607: sag st · sagittal · 0.59mm/px · 5 of 48 slices shown, 6 images]
[im 16/48  bone]
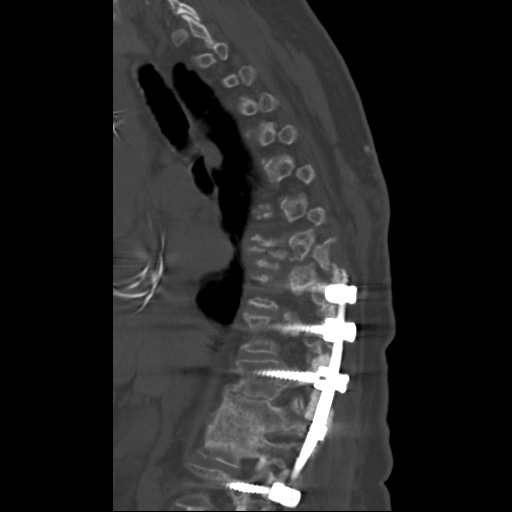
[im 20/48  bone]
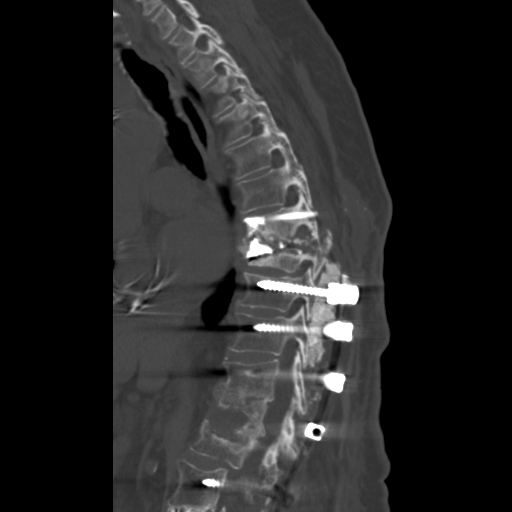
[im 24/48  soft-tissue]
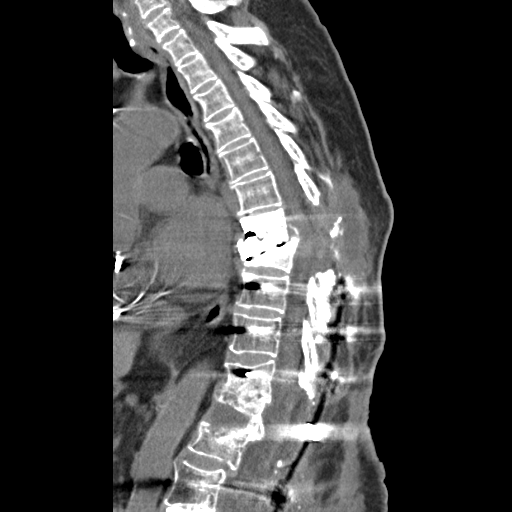
[im 24/48  bone]
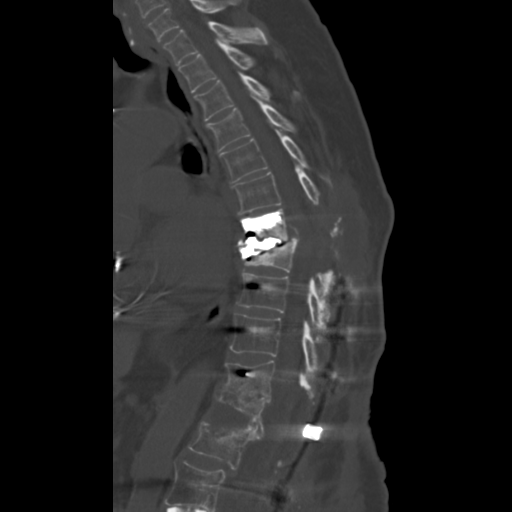
[im 28/48  bone]
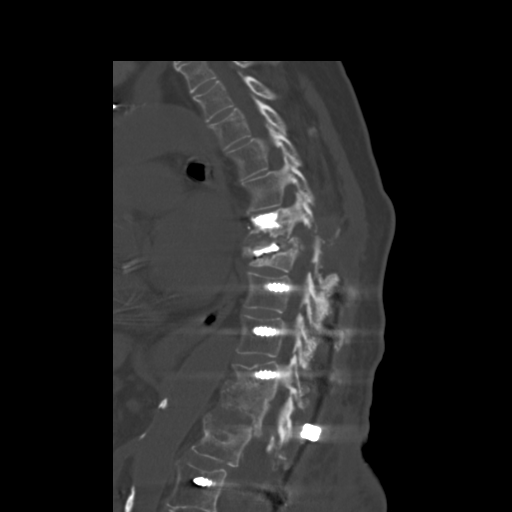
[im 32/48  bone]
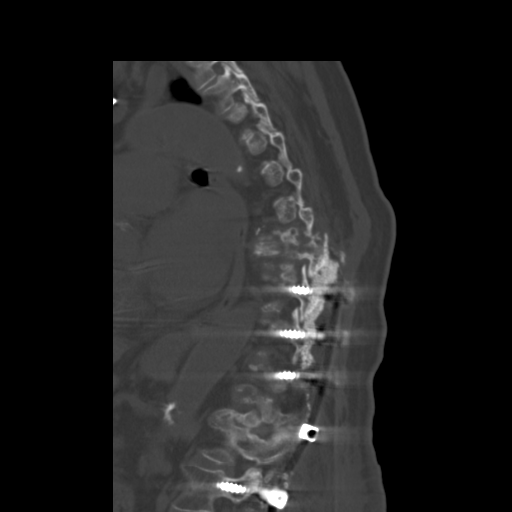

[11 of 35 positions shown; findings below may reference images not displayed]

FINDINGS: Since the myelogram, the patient is status post
decompressive laminectomy at T7 and T8.  Vertebral augmentation was
performed of both levels.  Since that study, there has been
significant collapse of the superior endplate of T8.  There is a
focal kyphosis at the T7-8 level.  5 mm anterolisthesis is present
at T7-8.  There is bone fragment or calcified disc material
extending 7 mm posterior to the T7 vertebral body.  There is bone
fragment contributing to foraminal stenosis, left greater than
right at T7-8.  The left T8 pedicle is fractured.  There is some
lucency surrounding the methylmethacrylate to implanted at T8,
suggesting incomplete incorporation.  Infection is also in the
differential.  The methylmethacrylate at T7 is within the
trabecular spaces.

The inferior articulating facet of T7 is perched on the superior
articulating facet of T8 bilaterally.

The patient is fused posteriorly from T9 inferior.  The more
inferior hardware is stable.  Marked lucency is again evident at
the T12-L1 disc spaces, without significant change.  A remote
superior endplate depression at T4 is stable.  Mild facet
degenerative changes are noted in the upper thoracic spine without
focal stenosis.

Comparison with the prior radiographs, the anterolisthesis at T7-8
to and kyphosis is similar.
IMPRESSION: 1.  Further collapse of the T8 vertebral body following spinal
augmentation.
2.  Extension of methylmethacrylate to cross the disc space and
into the inferior endplate of T7.
4.  Anterolisthesis at T7-8 measuring 7 mm from either calcified
disc or bone fragments extending into the canal space.
5.  Status post decompressive laminectomy at T7 and T8.
6.  The posterior facets at T7 are perched on the superior
articulating facets of T8.
7.  In combination with lucency surrounding the methylmethacrylate
at T8 and across the previous T7-8 disc space, this suggests
instability at this level.

8.  Left greater than right foraminal stenosis at T7-8 secondary to
displaced bone fragments.
9.  Fracture of the left T8 pedicle.
10.  Stable degenerative changes in the upper thoracic spine.
11.  Stable fusion and hardware at T9 and below.

## 2013-01-29 DIAGNOSIS — I059 Rheumatic mitral valve disease, unspecified: Secondary | ICD-10-CM | POA: Diagnosis not present

## 2013-01-29 DIAGNOSIS — I080 Rheumatic disorders of both mitral and aortic valves: Secondary | ICD-10-CM | POA: Diagnosis not present

## 2013-01-29 DIAGNOSIS — Z950 Presence of cardiac pacemaker: Secondary | ICD-10-CM | POA: Diagnosis not present

## 2013-01-29 DIAGNOSIS — Z01810 Encounter for preprocedural cardiovascular examination: Secondary | ICD-10-CM | POA: Diagnosis not present

## 2013-01-29 DIAGNOSIS — I359 Nonrheumatic aortic valve disorder, unspecified: Secondary | ICD-10-CM | POA: Diagnosis not present

## 2013-01-29 DIAGNOSIS — I119 Hypertensive heart disease without heart failure: Secondary | ICD-10-CM | POA: Diagnosis not present

## 2013-01-29 DIAGNOSIS — I4891 Unspecified atrial fibrillation: Secondary | ICD-10-CM | POA: Diagnosis not present

## 2013-01-30 DIAGNOSIS — I4891 Unspecified atrial fibrillation: Secondary | ICD-10-CM | POA: Diagnosis not present

## 2013-01-30 DIAGNOSIS — Z01810 Encounter for preprocedural cardiovascular examination: Secondary | ICD-10-CM | POA: Diagnosis not present

## 2013-02-03 DIAGNOSIS — I359 Nonrheumatic aortic valve disorder, unspecified: Secondary | ICD-10-CM | POA: Diagnosis not present

## 2013-02-03 DIAGNOSIS — I059 Rheumatic mitral valve disease, unspecified: Secondary | ICD-10-CM | POA: Diagnosis not present

## 2013-02-05 IMAGING — CR DG CHEST 1V PORT
1 series · 1 of 1 positions shown · non-contrast
Comparison: Portable chest x-ray of 08/17/2010

CLINICAL DATA: Central line placement

PORTABLE CHEST - 1 VIEW

[view not recorded]
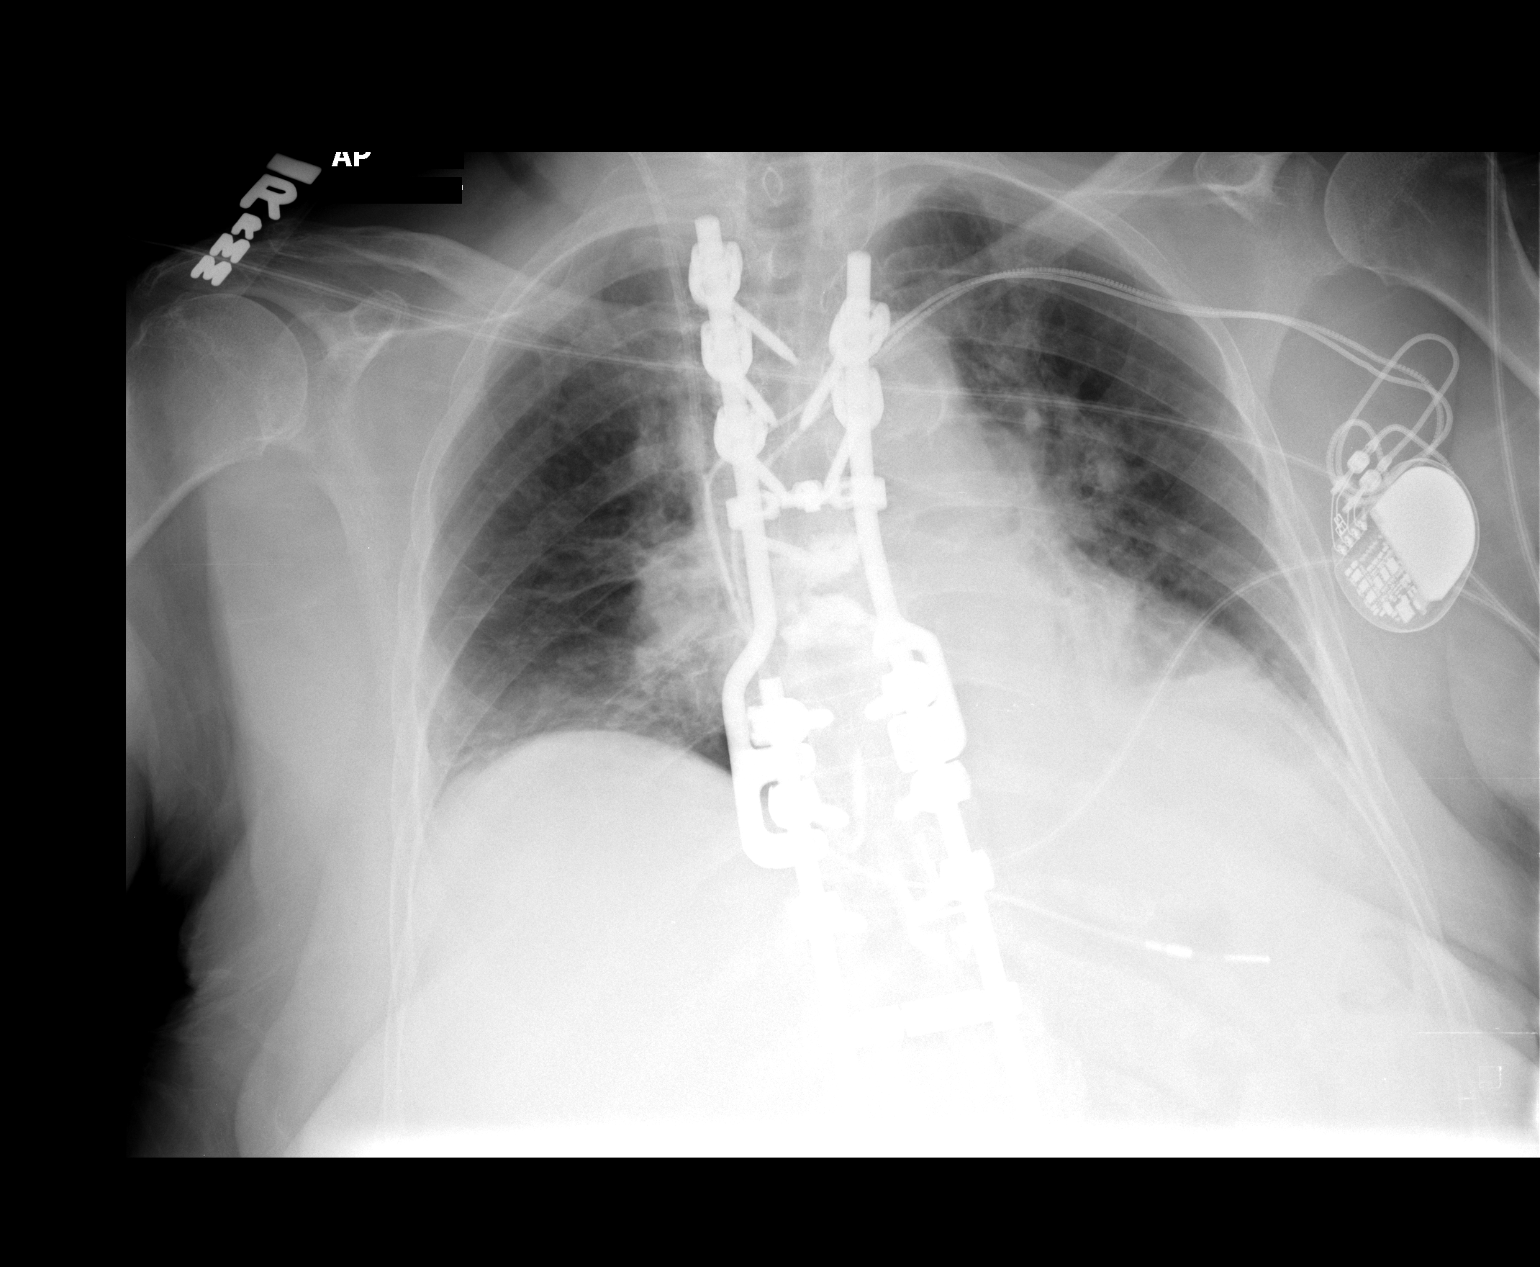

[1 of 1 positions shown; findings below may reference images not displayed]

FINDINGS: Both right and left central venous lines are present.
However the tips of these lines are difficult to visualize due to
overlying Harrington rods for fixation of the thoracic spine.
Bibasilar opacities are noted most consistent with atelectasis.
Cardiomegaly is stable. A permanent pacemaker is present.
IMPRESSION: 1.  Bilateral central venous lines are present but the tips cannot
be seen due to overlying Harrington rods.
2.  Bibasilar atelectasis.  Consider follow-up to exclude
pneumonia.

## 2013-02-05 IMAGING — RF DG THORACIC SPINE 2V
1 series · 2 of 2 positions shown · non-contrast
Comparison: CT on 04/04/2011

CLINICAL DATA: Thoracic vertebral compression fractures kyphosis.
Thoracic spine fusion.

THORACIC SPINE - 2 VIEW

[Series 1: run · 2 of 2 slices shown]
[im 1/2]
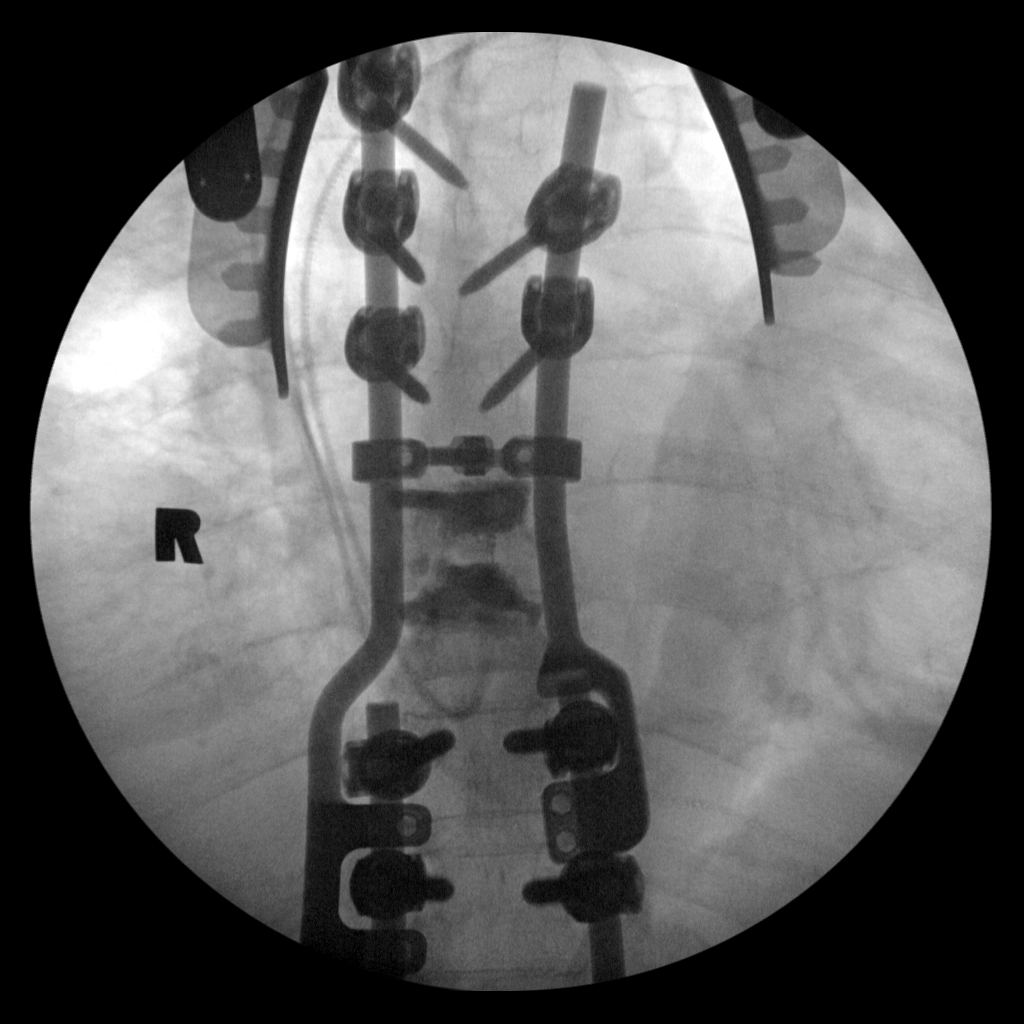
[im 2/2]
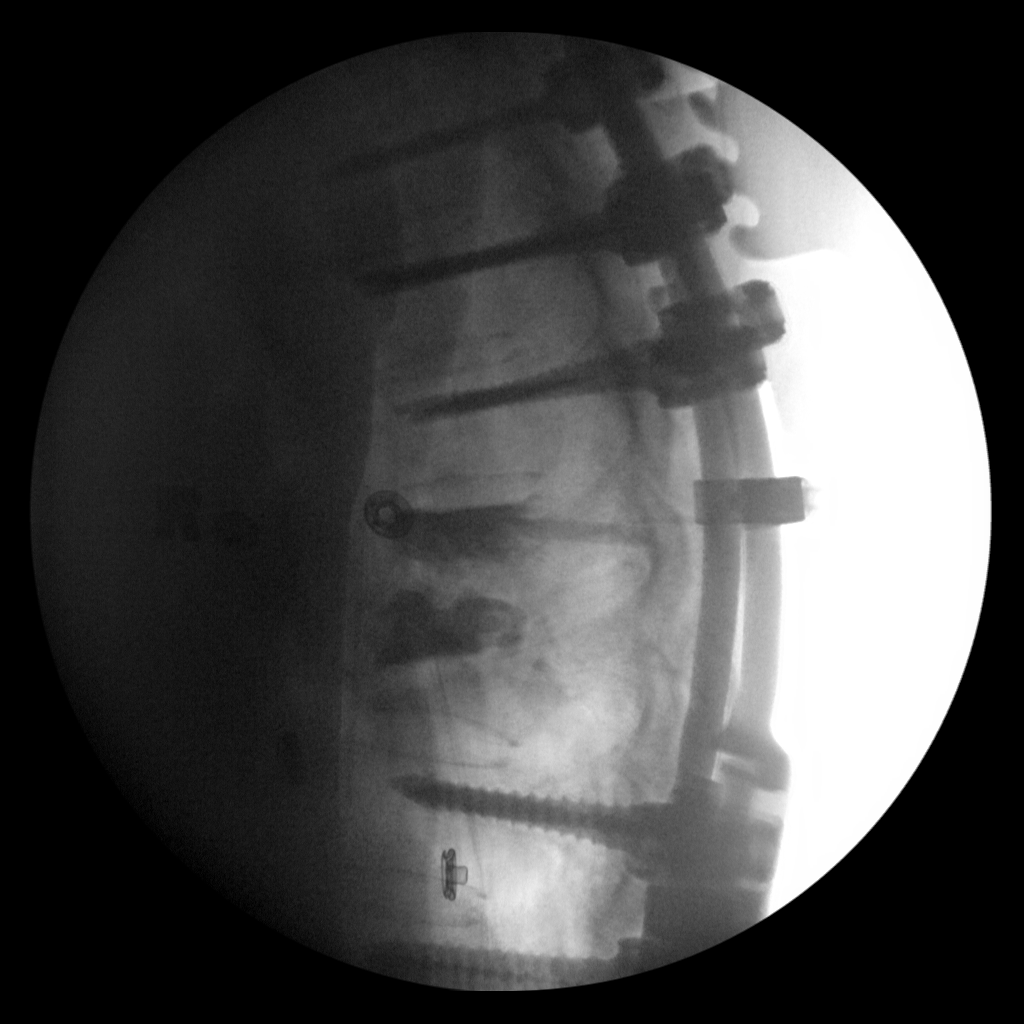

[2 of 2 positions shown; findings below may reference images not displayed]

FINDINGS: Intraoperative AP and cross-table lateral images again
show compression fractures and previous vertebroplasties of the T7-
T8 vertebral bodies.  Fixation screws and posterior fixation rods
are seen in place from levels of T4-T10, and thoracic spine
alignment appears anatomic at these levels.
IMPRESSION: Posterior thoracic spine fusion from T4-T10 with anatomic
alignment.

## 2013-02-18 DIAGNOSIS — I498 Other specified cardiac arrhythmias: Secondary | ICD-10-CM | POA: Diagnosis not present

## 2013-03-24 DIAGNOSIS — M67919 Unspecified disorder of synovium and tendon, unspecified shoulder: Secondary | ICD-10-CM | POA: Diagnosis not present

## 2013-03-24 DIAGNOSIS — M24119 Other articular cartilage disorders, unspecified shoulder: Secondary | ICD-10-CM | POA: Diagnosis not present

## 2013-03-24 DIAGNOSIS — G8918 Other acute postprocedural pain: Secondary | ICD-10-CM | POA: Diagnosis not present

## 2013-03-24 DIAGNOSIS — M75120 Complete rotator cuff tear or rupture of unspecified shoulder, not specified as traumatic: Secondary | ICD-10-CM | POA: Diagnosis not present

## 2013-03-24 DIAGNOSIS — M9420 Chondromalacia, unspecified site: Secondary | ICD-10-CM | POA: Diagnosis not present

## 2013-03-26 DIAGNOSIS — M25519 Pain in unspecified shoulder: Secondary | ICD-10-CM | POA: Diagnosis not present

## 2013-03-28 DIAGNOSIS — M25519 Pain in unspecified shoulder: Secondary | ICD-10-CM | POA: Diagnosis not present

## 2013-04-01 DIAGNOSIS — M25519 Pain in unspecified shoulder: Secondary | ICD-10-CM | POA: Diagnosis not present

## 2013-04-03 DIAGNOSIS — M25519 Pain in unspecified shoulder: Secondary | ICD-10-CM | POA: Diagnosis not present

## 2013-04-08 DIAGNOSIS — M25519 Pain in unspecified shoulder: Secondary | ICD-10-CM | POA: Diagnosis not present

## 2013-04-10 DIAGNOSIS — M25519 Pain in unspecified shoulder: Secondary | ICD-10-CM | POA: Diagnosis not present

## 2013-04-15 DIAGNOSIS — M25519 Pain in unspecified shoulder: Secondary | ICD-10-CM | POA: Diagnosis not present

## 2013-04-17 DIAGNOSIS — I10 Essential (primary) hypertension: Secondary | ICD-10-CM | POA: Diagnosis not present

## 2013-04-17 DIAGNOSIS — M546 Pain in thoracic spine: Secondary | ICD-10-CM | POA: Diagnosis not present

## 2013-04-17 DIAGNOSIS — G8929 Other chronic pain: Secondary | ICD-10-CM

## 2013-04-17 DIAGNOSIS — E669 Obesity, unspecified: Secondary | ICD-10-CM | POA: Diagnosis not present

## 2013-04-17 DIAGNOSIS — M5126 Other intervertebral disc displacement, lumbar region: Secondary | ICD-10-CM | POA: Diagnosis not present

## 2013-04-17 HISTORY — DX: Other chronic pain: G89.29

## 2013-04-18 DIAGNOSIS — M25519 Pain in unspecified shoulder: Secondary | ICD-10-CM | POA: Diagnosis not present

## 2013-05-19 DIAGNOSIS — I10 Essential (primary) hypertension: Secondary | ICD-10-CM | POA: Diagnosis not present

## 2013-05-19 DIAGNOSIS — Z6831 Body mass index (BMI) 31.0-31.9, adult: Secondary | ICD-10-CM | POA: Diagnosis not present

## 2013-05-19 DIAGNOSIS — D649 Anemia, unspecified: Secondary | ICD-10-CM | POA: Diagnosis not present

## 2013-05-19 DIAGNOSIS — I4891 Unspecified atrial fibrillation: Secondary | ICD-10-CM | POA: Diagnosis not present

## 2013-05-19 DIAGNOSIS — E785 Hyperlipidemia, unspecified: Secondary | ICD-10-CM | POA: Diagnosis not present

## 2013-05-26 DIAGNOSIS — M25519 Pain in unspecified shoulder: Secondary | ICD-10-CM | POA: Diagnosis not present

## 2013-05-26 DIAGNOSIS — M6281 Muscle weakness (generalized): Secondary | ICD-10-CM | POA: Diagnosis not present

## 2013-05-29 DIAGNOSIS — M25519 Pain in unspecified shoulder: Secondary | ICD-10-CM | POA: Diagnosis not present

## 2013-05-29 DIAGNOSIS — M6281 Muscle weakness (generalized): Secondary | ICD-10-CM | POA: Diagnosis not present

## 2013-06-02 DIAGNOSIS — M6281 Muscle weakness (generalized): Secondary | ICD-10-CM | POA: Diagnosis not present

## 2013-06-02 DIAGNOSIS — M25519 Pain in unspecified shoulder: Secondary | ICD-10-CM | POA: Diagnosis not present

## 2013-06-05 DIAGNOSIS — M25519 Pain in unspecified shoulder: Secondary | ICD-10-CM | POA: Diagnosis not present

## 2013-06-05 DIAGNOSIS — M6281 Muscle weakness (generalized): Secondary | ICD-10-CM | POA: Diagnosis not present

## 2013-06-09 DIAGNOSIS — M25519 Pain in unspecified shoulder: Secondary | ICD-10-CM | POA: Diagnosis not present

## 2013-06-09 DIAGNOSIS — M6281 Muscle weakness (generalized): Secondary | ICD-10-CM | POA: Diagnosis not present

## 2013-06-12 DIAGNOSIS — M25519 Pain in unspecified shoulder: Secondary | ICD-10-CM | POA: Diagnosis not present

## 2013-06-12 DIAGNOSIS — M6281 Muscle weakness (generalized): Secondary | ICD-10-CM | POA: Diagnosis not present

## 2013-06-16 DIAGNOSIS — M25519 Pain in unspecified shoulder: Secondary | ICD-10-CM | POA: Diagnosis not present

## 2013-06-16 DIAGNOSIS — M6281 Muscle weakness (generalized): Secondary | ICD-10-CM | POA: Diagnosis not present

## 2013-06-17 DIAGNOSIS — M4120 Other idiopathic scoliosis, site unspecified: Secondary | ICD-10-CM | POA: Diagnosis not present

## 2013-06-17 DIAGNOSIS — Z6830 Body mass index (BMI) 30.0-30.9, adult: Secondary | ICD-10-CM | POA: Diagnosis not present

## 2013-06-17 DIAGNOSIS — M545 Low back pain, unspecified: Secondary | ICD-10-CM | POA: Diagnosis not present

## 2013-06-17 DIAGNOSIS — I10 Essential (primary) hypertension: Secondary | ICD-10-CM | POA: Diagnosis not present

## 2013-06-24 DIAGNOSIS — M24119 Other articular cartilage disorders, unspecified shoulder: Secondary | ICD-10-CM | POA: Diagnosis not present

## 2013-07-02 DIAGNOSIS — H01009 Unspecified blepharitis unspecified eye, unspecified eyelid: Secondary | ICD-10-CM | POA: Diagnosis not present

## 2013-07-02 DIAGNOSIS — H11829 Conjunctivochalasis, unspecified eye: Secondary | ICD-10-CM | POA: Diagnosis not present

## 2013-07-02 DIAGNOSIS — D3130 Benign neoplasm of unspecified choroid: Secondary | ICD-10-CM | POA: Diagnosis not present

## 2013-07-02 DIAGNOSIS — H04129 Dry eye syndrome of unspecified lacrimal gland: Secondary | ICD-10-CM | POA: Diagnosis not present

## 2013-07-11 DIAGNOSIS — E785 Hyperlipidemia, unspecified: Secondary | ICD-10-CM | POA: Diagnosis not present

## 2013-07-11 DIAGNOSIS — Z6831 Body mass index (BMI) 31.0-31.9, adult: Secondary | ICD-10-CM | POA: Diagnosis not present

## 2013-07-11 DIAGNOSIS — R7402 Elevation of levels of lactic acid dehydrogenase (LDH): Secondary | ICD-10-CM | POA: Diagnosis not present

## 2013-07-16 DIAGNOSIS — Z950 Presence of cardiac pacemaker: Secondary | ICD-10-CM | POA: Diagnosis not present

## 2013-07-22 DIAGNOSIS — M25519 Pain in unspecified shoulder: Secondary | ICD-10-CM | POA: Diagnosis not present

## 2013-07-22 DIAGNOSIS — IMO0002 Reserved for concepts with insufficient information to code with codable children: Secondary | ICD-10-CM | POA: Diagnosis not present

## 2013-07-22 DIAGNOSIS — M1710 Unilateral primary osteoarthritis, unspecified knee: Secondary | ICD-10-CM | POA: Diagnosis not present

## 2013-07-22 DIAGNOSIS — S43429A Sprain of unspecified rotator cuff capsule, initial encounter: Secondary | ICD-10-CM | POA: Diagnosis not present

## 2013-08-07 DIAGNOSIS — Z6829 Body mass index (BMI) 29.0-29.9, adult: Secondary | ICD-10-CM | POA: Diagnosis not present

## 2013-08-07 DIAGNOSIS — I10 Essential (primary) hypertension: Secondary | ICD-10-CM | POA: Diagnosis not present

## 2013-08-07 DIAGNOSIS — M5126 Other intervertebral disc displacement, lumbar region: Secondary | ICD-10-CM | POA: Diagnosis not present

## 2013-08-07 DIAGNOSIS — M4120 Other idiopathic scoliosis, site unspecified: Secondary | ICD-10-CM | POA: Diagnosis not present

## 2013-08-07 DIAGNOSIS — M546 Pain in thoracic spine: Secondary | ICD-10-CM | POA: Diagnosis not present

## 2013-08-07 DIAGNOSIS — M419 Scoliosis, unspecified: Secondary | ICD-10-CM | POA: Insufficient documentation

## 2013-08-08 DIAGNOSIS — R7402 Elevation of levels of lactic acid dehydrogenase (LDH): Secondary | ICD-10-CM | POA: Diagnosis not present

## 2013-08-18 DIAGNOSIS — Z23 Encounter for immunization: Secondary | ICD-10-CM | POA: Diagnosis not present

## 2013-09-01 DIAGNOSIS — I080 Rheumatic disorders of both mitral and aortic valves: Secondary | ICD-10-CM | POA: Diagnosis not present

## 2013-09-01 DIAGNOSIS — Z950 Presence of cardiac pacemaker: Secondary | ICD-10-CM | POA: Diagnosis not present

## 2013-09-01 DIAGNOSIS — I359 Nonrheumatic aortic valve disorder, unspecified: Secondary | ICD-10-CM | POA: Diagnosis not present

## 2013-09-01 DIAGNOSIS — I4891 Unspecified atrial fibrillation: Secondary | ICD-10-CM | POA: Diagnosis not present

## 2013-10-06 DIAGNOSIS — I10 Essential (primary) hypertension: Secondary | ICD-10-CM

## 2013-10-06 DIAGNOSIS — M25569 Pain in unspecified knee: Secondary | ICD-10-CM | POA: Diagnosis not present

## 2013-10-06 DIAGNOSIS — IMO0002 Reserved for concepts with insufficient information to code with codable children: Secondary | ICD-10-CM | POA: Diagnosis not present

## 2013-10-06 DIAGNOSIS — T8489XA Other specified complication of internal orthopedic prosthetic devices, implants and grafts, initial encounter: Secondary | ICD-10-CM | POA: Diagnosis not present

## 2013-10-06 DIAGNOSIS — Z96659 Presence of unspecified artificial knee joint: Secondary | ICD-10-CM | POA: Diagnosis not present

## 2013-10-06 HISTORY — DX: Essential (primary) hypertension: I10

## 2013-10-18 DIAGNOSIS — Z950 Presence of cardiac pacemaker: Secondary | ICD-10-CM | POA: Diagnosis not present

## 2013-10-18 DIAGNOSIS — I4891 Unspecified atrial fibrillation: Secondary | ICD-10-CM | POA: Diagnosis not present

## 2013-10-18 DIAGNOSIS — I498 Other specified cardiac arrhythmias: Secondary | ICD-10-CM | POA: Diagnosis not present

## 2013-10-30 DIAGNOSIS — I10 Essential (primary) hypertension: Secondary | ICD-10-CM | POA: Diagnosis not present

## 2013-10-30 DIAGNOSIS — X500XXA Overexertion from strenuous movement or load, initial encounter: Secondary | ICD-10-CM | POA: Diagnosis not present

## 2013-10-30 DIAGNOSIS — X503XXA Overexertion from repetitive movements, initial encounter: Secondary | ICD-10-CM | POA: Diagnosis not present

## 2013-10-30 DIAGNOSIS — M25559 Pain in unspecified hip: Secondary | ICD-10-CM | POA: Diagnosis not present

## 2013-10-30 DIAGNOSIS — I4891 Unspecified atrial fibrillation: Secondary | ICD-10-CM | POA: Diagnosis not present

## 2013-10-30 DIAGNOSIS — S300XXA Contusion of lower back and pelvis, initial encounter: Secondary | ICD-10-CM | POA: Diagnosis not present

## 2013-10-30 DIAGNOSIS — Z79899 Other long term (current) drug therapy: Secondary | ICD-10-CM | POA: Diagnosis not present

## 2013-11-04 DIAGNOSIS — I10 Essential (primary) hypertension: Secondary | ICD-10-CM | POA: Diagnosis not present

## 2013-11-04 DIAGNOSIS — Z6831 Body mass index (BMI) 31.0-31.9, adult: Secondary | ICD-10-CM | POA: Diagnosis not present

## 2013-11-04 DIAGNOSIS — T148XXA Other injury of unspecified body region, initial encounter: Secondary | ICD-10-CM

## 2013-11-04 HISTORY — DX: Other injury of unspecified body region, initial encounter: T14.8XXA

## 2013-11-11 DIAGNOSIS — M1710 Unilateral primary osteoarthritis, unspecified knee: Secondary | ICD-10-CM | POA: Diagnosis not present

## 2013-11-11 DIAGNOSIS — T148XXA Other injury of unspecified body region, initial encounter: Secondary | ICD-10-CM | POA: Diagnosis not present

## 2013-11-11 DIAGNOSIS — IMO0002 Reserved for concepts with insufficient information to code with codable children: Secondary | ICD-10-CM | POA: Diagnosis not present

## 2013-11-11 DIAGNOSIS — R9389 Abnormal findings on diagnostic imaging of other specified body structures: Secondary | ICD-10-CM | POA: Diagnosis not present

## 2013-11-14 DIAGNOSIS — T84039A Mechanical loosening of unspecified internal prosthetic joint, initial encounter: Secondary | ICD-10-CM | POA: Diagnosis not present

## 2013-11-14 DIAGNOSIS — Z5189 Encounter for other specified aftercare: Secondary | ICD-10-CM | POA: Diagnosis not present

## 2013-11-17 DIAGNOSIS — Z23 Encounter for immunization: Secondary | ICD-10-CM | POA: Diagnosis not present

## 2013-11-19 DIAGNOSIS — E785 Hyperlipidemia, unspecified: Secondary | ICD-10-CM | POA: Diagnosis not present

## 2013-11-19 DIAGNOSIS — D638 Anemia in other chronic diseases classified elsewhere: Secondary | ICD-10-CM | POA: Diagnosis not present

## 2013-11-19 DIAGNOSIS — D539 Nutritional anemia, unspecified: Secondary | ICD-10-CM | POA: Diagnosis not present

## 2013-11-19 DIAGNOSIS — I4891 Unspecified atrial fibrillation: Secondary | ICD-10-CM | POA: Diagnosis not present

## 2013-11-19 DIAGNOSIS — D500 Iron deficiency anemia secondary to blood loss (chronic): Secondary | ICD-10-CM | POA: Diagnosis not present

## 2013-11-19 DIAGNOSIS — M810 Age-related osteoporosis without current pathological fracture: Secondary | ICD-10-CM | POA: Diagnosis not present

## 2013-11-19 DIAGNOSIS — Z9181 History of falling: Secondary | ICD-10-CM | POA: Diagnosis not present

## 2013-11-19 DIAGNOSIS — R7402 Elevation of levels of lactic acid dehydrogenase (LDH): Secondary | ICD-10-CM | POA: Diagnosis not present

## 2013-11-19 DIAGNOSIS — Z1331 Encounter for screening for depression: Secondary | ICD-10-CM | POA: Diagnosis not present

## 2013-11-19 DIAGNOSIS — Z6830 Body mass index (BMI) 30.0-30.9, adult: Secondary | ICD-10-CM | POA: Diagnosis not present

## 2013-11-19 DIAGNOSIS — D509 Iron deficiency anemia, unspecified: Secondary | ICD-10-CM | POA: Diagnosis not present

## 2013-11-19 DIAGNOSIS — F411 Generalized anxiety disorder: Secondary | ICD-10-CM | POA: Diagnosis not present

## 2013-11-19 DIAGNOSIS — I10 Essential (primary) hypertension: Secondary | ICD-10-CM | POA: Diagnosis not present

## 2013-11-25 DIAGNOSIS — Z5189 Encounter for other specified aftercare: Secondary | ICD-10-CM | POA: Diagnosis not present

## 2013-11-25 DIAGNOSIS — T84039A Mechanical loosening of unspecified internal prosthetic joint, initial encounter: Secondary | ICD-10-CM | POA: Diagnosis not present

## 2013-11-27 DIAGNOSIS — D510 Vitamin B12 deficiency anemia due to intrinsic factor deficiency: Secondary | ICD-10-CM | POA: Diagnosis not present

## 2013-12-02 DIAGNOSIS — Z6831 Body mass index (BMI) 31.0-31.9, adult: Secondary | ICD-10-CM | POA: Diagnosis not present

## 2013-12-02 DIAGNOSIS — I10 Essential (primary) hypertension: Secondary | ICD-10-CM | POA: Diagnosis not present

## 2013-12-02 DIAGNOSIS — M4120 Other idiopathic scoliosis, site unspecified: Secondary | ICD-10-CM | POA: Diagnosis not present

## 2013-12-02 DIAGNOSIS — M5126 Other intervertebral disc displacement, lumbar region: Secondary | ICD-10-CM | POA: Diagnosis not present

## 2013-12-17 DIAGNOSIS — I059 Rheumatic mitral valve disease, unspecified: Secondary | ICD-10-CM | POA: Diagnosis not present

## 2013-12-17 DIAGNOSIS — Z950 Presence of cardiac pacemaker: Secondary | ICD-10-CM | POA: Diagnosis not present

## 2013-12-17 DIAGNOSIS — I4891 Unspecified atrial fibrillation: Secondary | ICD-10-CM | POA: Diagnosis not present

## 2013-12-17 DIAGNOSIS — I359 Nonrheumatic aortic valve disorder, unspecified: Secondary | ICD-10-CM | POA: Diagnosis not present

## 2013-12-17 DIAGNOSIS — I119 Hypertensive heart disease without heart failure: Secondary | ICD-10-CM | POA: Diagnosis not present

## 2013-12-26 DIAGNOSIS — M85852 Other specified disorders of bone density and structure, left thigh: Secondary | ICD-10-CM | POA: Diagnosis not present

## 2013-12-26 DIAGNOSIS — M899 Disorder of bone, unspecified: Secondary | ICD-10-CM | POA: Diagnosis not present

## 2013-12-26 DIAGNOSIS — Z13820 Encounter for screening for osteoporosis: Secondary | ICD-10-CM | POA: Diagnosis not present

## 2014-01-05 DIAGNOSIS — Z01818 Encounter for other preprocedural examination: Secondary | ICD-10-CM | POA: Diagnosis not present

## 2014-01-05 DIAGNOSIS — M1711 Unilateral primary osteoarthritis, right knee: Secondary | ICD-10-CM | POA: Diagnosis not present

## 2014-01-05 DIAGNOSIS — M25561 Pain in right knee: Secondary | ICD-10-CM | POA: Diagnosis not present

## 2014-01-06 DIAGNOSIS — K219 Gastro-esophageal reflux disease without esophagitis: Secondary | ICD-10-CM | POA: Diagnosis not present

## 2014-01-06 DIAGNOSIS — I10 Essential (primary) hypertension: Secondary | ICD-10-CM | POA: Diagnosis not present

## 2014-01-06 DIAGNOSIS — Y838 Other surgical procedures as the cause of abnormal reaction of the patient, or of later complication, without mention of misadventure at the time of the procedure: Secondary | ICD-10-CM | POA: Diagnosis not present

## 2014-01-06 DIAGNOSIS — T8453XA Infection and inflammatory reaction due to internal right knee prosthesis, initial encounter: Secondary | ICD-10-CM | POA: Diagnosis not present

## 2014-01-13 DIAGNOSIS — K219 Gastro-esophageal reflux disease without esophagitis: Secondary | ICD-10-CM | POA: Diagnosis present

## 2014-01-13 DIAGNOSIS — Z452 Encounter for adjustment and management of vascular access device: Secondary | ICD-10-CM | POA: Diagnosis not present

## 2014-01-13 DIAGNOSIS — T8489XA Other specified complication of internal orthopedic prosthetic devices, implants and grafts, initial encounter: Secondary | ICD-10-CM | POA: Diagnosis not present

## 2014-01-13 DIAGNOSIS — Z96651 Presence of right artificial knee joint: Secondary | ICD-10-CM | POA: Diagnosis not present

## 2014-01-13 DIAGNOSIS — I10 Essential (primary) hypertension: Secondary | ICD-10-CM | POA: Diagnosis present

## 2014-01-13 DIAGNOSIS — T8453XA Infection and inflammatory reaction due to internal right knee prosthesis, initial encounter: Secondary | ICD-10-CM | POA: Diagnosis not present

## 2014-01-13 DIAGNOSIS — G8918 Other acute postprocedural pain: Secondary | ICD-10-CM | POA: Diagnosis not present

## 2014-01-13 DIAGNOSIS — M659 Synovitis and tenosynovitis, unspecified: Secondary | ICD-10-CM | POA: Diagnosis not present

## 2014-01-13 DIAGNOSIS — T8450XA Infection and inflammatory reaction due to unspecified internal joint prosthesis, initial encounter: Secondary | ICD-10-CM | POA: Diagnosis not present

## 2014-01-13 DIAGNOSIS — M67961 Unspecified disorder of synovium and tendon, right lower leg: Secondary | ICD-10-CM | POA: Diagnosis not present

## 2014-01-17 DIAGNOSIS — Z5181 Encounter for therapeutic drug level monitoring: Secondary | ICD-10-CM | POA: Diagnosis not present

## 2014-01-17 DIAGNOSIS — Z452 Encounter for adjustment and management of vascular access device: Secondary | ICD-10-CM | POA: Diagnosis not present

## 2014-01-17 DIAGNOSIS — I10 Essential (primary) hypertension: Secondary | ICD-10-CM | POA: Diagnosis not present

## 2014-01-17 DIAGNOSIS — T8453XD Infection and inflammatory reaction due to internal right knee prosthesis, subsequent encounter: Secondary | ICD-10-CM | POA: Diagnosis not present

## 2014-01-17 DIAGNOSIS — Z89521 Acquired absence of right knee: Secondary | ICD-10-CM | POA: Diagnosis not present

## 2014-01-17 DIAGNOSIS — M150 Primary generalized (osteo)arthritis: Secondary | ICD-10-CM | POA: Diagnosis not present

## 2014-01-18 DIAGNOSIS — T8453XD Infection and inflammatory reaction due to internal right knee prosthesis, subsequent encounter: Secondary | ICD-10-CM | POA: Diagnosis not present

## 2014-01-18 DIAGNOSIS — Z5181 Encounter for therapeutic drug level monitoring: Secondary | ICD-10-CM | POA: Diagnosis not present

## 2014-01-18 DIAGNOSIS — M150 Primary generalized (osteo)arthritis: Secondary | ICD-10-CM | POA: Diagnosis not present

## 2014-01-18 DIAGNOSIS — Z452 Encounter for adjustment and management of vascular access device: Secondary | ICD-10-CM | POA: Diagnosis not present

## 2014-01-18 DIAGNOSIS — I10 Essential (primary) hypertension: Secondary | ICD-10-CM | POA: Diagnosis not present

## 2014-01-18 DIAGNOSIS — Z89521 Acquired absence of right knee: Secondary | ICD-10-CM | POA: Diagnosis not present

## 2014-01-19 DIAGNOSIS — T8453XD Infection and inflammatory reaction due to internal right knee prosthesis, subsequent encounter: Secondary | ICD-10-CM | POA: Diagnosis not present

## 2014-01-19 DIAGNOSIS — M150 Primary generalized (osteo)arthritis: Secondary | ICD-10-CM | POA: Diagnosis not present

## 2014-01-19 DIAGNOSIS — Z5181 Encounter for therapeutic drug level monitoring: Secondary | ICD-10-CM | POA: Diagnosis not present

## 2014-01-19 DIAGNOSIS — Z452 Encounter for adjustment and management of vascular access device: Secondary | ICD-10-CM | POA: Diagnosis not present

## 2014-01-19 DIAGNOSIS — M869 Osteomyelitis, unspecified: Secondary | ICD-10-CM | POA: Diagnosis not present

## 2014-01-19 DIAGNOSIS — Z89521 Acquired absence of right knee: Secondary | ICD-10-CM | POA: Diagnosis not present

## 2014-01-19 DIAGNOSIS — I10 Essential (primary) hypertension: Secondary | ICD-10-CM | POA: Diagnosis not present

## 2014-01-22 DIAGNOSIS — I498 Other specified cardiac arrhythmias: Secondary | ICD-10-CM | POA: Diagnosis not present

## 2014-01-26 DIAGNOSIS — M150 Primary generalized (osteo)arthritis: Secondary | ICD-10-CM | POA: Diagnosis not present

## 2014-01-26 DIAGNOSIS — Z452 Encounter for adjustment and management of vascular access device: Secondary | ICD-10-CM | POA: Diagnosis not present

## 2014-01-26 DIAGNOSIS — M869 Osteomyelitis, unspecified: Secondary | ICD-10-CM | POA: Diagnosis not present

## 2014-01-26 DIAGNOSIS — Z89521 Acquired absence of right knee: Secondary | ICD-10-CM | POA: Diagnosis not present

## 2014-01-26 DIAGNOSIS — T8453XD Infection and inflammatory reaction due to internal right knee prosthesis, subsequent encounter: Secondary | ICD-10-CM | POA: Diagnosis not present

## 2014-01-26 DIAGNOSIS — Z5181 Encounter for therapeutic drug level monitoring: Secondary | ICD-10-CM | POA: Diagnosis not present

## 2014-01-26 DIAGNOSIS — I10 Essential (primary) hypertension: Secondary | ICD-10-CM | POA: Diagnosis not present

## 2014-01-27 DIAGNOSIS — Z5181 Encounter for therapeutic drug level monitoring: Secondary | ICD-10-CM | POA: Diagnosis not present

## 2014-01-27 DIAGNOSIS — Z89521 Acquired absence of right knee: Secondary | ICD-10-CM | POA: Diagnosis not present

## 2014-01-27 DIAGNOSIS — Z452 Encounter for adjustment and management of vascular access device: Secondary | ICD-10-CM | POA: Diagnosis not present

## 2014-01-27 DIAGNOSIS — M150 Primary generalized (osteo)arthritis: Secondary | ICD-10-CM | POA: Diagnosis not present

## 2014-01-27 DIAGNOSIS — I10 Essential (primary) hypertension: Secondary | ICD-10-CM | POA: Diagnosis not present

## 2014-01-27 DIAGNOSIS — T8453XD Infection and inflammatory reaction due to internal right knee prosthesis, subsequent encounter: Secondary | ICD-10-CM | POA: Diagnosis not present

## 2014-02-02 DIAGNOSIS — Z452 Encounter for adjustment and management of vascular access device: Secondary | ICD-10-CM | POA: Diagnosis not present

## 2014-02-02 DIAGNOSIS — M869 Osteomyelitis, unspecified: Secondary | ICD-10-CM | POA: Diagnosis not present

## 2014-02-02 DIAGNOSIS — T8453XD Infection and inflammatory reaction due to internal right knee prosthesis, subsequent encounter: Secondary | ICD-10-CM | POA: Diagnosis not present

## 2014-02-02 DIAGNOSIS — I10 Essential (primary) hypertension: Secondary | ICD-10-CM | POA: Diagnosis not present

## 2014-02-02 DIAGNOSIS — M150 Primary generalized (osteo)arthritis: Secondary | ICD-10-CM | POA: Diagnosis not present

## 2014-02-02 DIAGNOSIS — Z89521 Acquired absence of right knee: Secondary | ICD-10-CM | POA: Diagnosis not present

## 2014-02-02 DIAGNOSIS — Z5181 Encounter for therapeutic drug level monitoring: Secondary | ICD-10-CM | POA: Diagnosis not present

## 2014-02-05 DIAGNOSIS — Z89521 Acquired absence of right knee: Secondary | ICD-10-CM | POA: Diagnosis not present

## 2014-02-05 DIAGNOSIS — Z95828 Presence of other vascular implants and grafts: Secondary | ICD-10-CM | POA: Diagnosis not present

## 2014-02-05 DIAGNOSIS — M150 Primary generalized (osteo)arthritis: Secondary | ICD-10-CM | POA: Diagnosis not present

## 2014-02-05 DIAGNOSIS — Z5181 Encounter for therapeutic drug level monitoring: Secondary | ICD-10-CM | POA: Diagnosis not present

## 2014-02-05 DIAGNOSIS — T8453XD Infection and inflammatory reaction due to internal right knee prosthesis, subsequent encounter: Secondary | ICD-10-CM | POA: Diagnosis not present

## 2014-02-05 DIAGNOSIS — Z792 Long term (current) use of antibiotics: Secondary | ICD-10-CM | POA: Diagnosis not present

## 2014-02-05 DIAGNOSIS — Z452 Encounter for adjustment and management of vascular access device: Secondary | ICD-10-CM | POA: Diagnosis not present

## 2014-02-05 DIAGNOSIS — T8450XD Infection and inflammatory reaction due to unspecified internal joint prosthesis, subsequent encounter: Secondary | ICD-10-CM | POA: Diagnosis not present

## 2014-02-05 DIAGNOSIS — I10 Essential (primary) hypertension: Secondary | ICD-10-CM | POA: Diagnosis not present

## 2014-02-08 DIAGNOSIS — M150 Primary generalized (osteo)arthritis: Secondary | ICD-10-CM | POA: Diagnosis not present

## 2014-02-08 DIAGNOSIS — Z89521 Acquired absence of right knee: Secondary | ICD-10-CM | POA: Diagnosis not present

## 2014-02-08 DIAGNOSIS — Z5181 Encounter for therapeutic drug level monitoring: Secondary | ICD-10-CM | POA: Diagnosis not present

## 2014-02-08 DIAGNOSIS — Z452 Encounter for adjustment and management of vascular access device: Secondary | ICD-10-CM | POA: Diagnosis not present

## 2014-02-08 DIAGNOSIS — Z792 Long term (current) use of antibiotics: Secondary | ICD-10-CM | POA: Diagnosis not present

## 2014-02-08 DIAGNOSIS — I10 Essential (primary) hypertension: Secondary | ICD-10-CM | POA: Diagnosis not present

## 2014-02-08 DIAGNOSIS — T8453XD Infection and inflammatory reaction due to internal right knee prosthesis, subsequent encounter: Secondary | ICD-10-CM | POA: Diagnosis not present

## 2014-02-16 DIAGNOSIS — I10 Essential (primary) hypertension: Secondary | ICD-10-CM | POA: Diagnosis not present

## 2014-02-16 DIAGNOSIS — T8453XD Infection and inflammatory reaction due to internal right knee prosthesis, subsequent encounter: Secondary | ICD-10-CM | POA: Diagnosis not present

## 2014-02-16 DIAGNOSIS — Z5181 Encounter for therapeutic drug level monitoring: Secondary | ICD-10-CM | POA: Diagnosis not present

## 2014-02-16 DIAGNOSIS — M869 Osteomyelitis, unspecified: Secondary | ICD-10-CM | POA: Diagnosis not present

## 2014-02-16 DIAGNOSIS — M150 Primary generalized (osteo)arthritis: Secondary | ICD-10-CM | POA: Diagnosis not present

## 2014-02-16 DIAGNOSIS — Z452 Encounter for adjustment and management of vascular access device: Secondary | ICD-10-CM | POA: Diagnosis not present

## 2014-02-16 DIAGNOSIS — Z89521 Acquired absence of right knee: Secondary | ICD-10-CM | POA: Diagnosis not present

## 2014-02-20 DIAGNOSIS — T8453XA Infection and inflammatory reaction due to internal right knee prosthesis, initial encounter: Secondary | ICD-10-CM | POA: Diagnosis not present

## 2014-02-23 DIAGNOSIS — Z89521 Acquired absence of right knee: Secondary | ICD-10-CM | POA: Diagnosis not present

## 2014-02-23 DIAGNOSIS — T8453XD Infection and inflammatory reaction due to internal right knee prosthesis, subsequent encounter: Secondary | ICD-10-CM | POA: Diagnosis not present

## 2014-02-23 DIAGNOSIS — I10 Essential (primary) hypertension: Secondary | ICD-10-CM | POA: Diagnosis not present

## 2014-02-23 DIAGNOSIS — Z5181 Encounter for therapeutic drug level monitoring: Secondary | ICD-10-CM | POA: Diagnosis not present

## 2014-02-23 DIAGNOSIS — Z452 Encounter for adjustment and management of vascular access device: Secondary | ICD-10-CM | POA: Diagnosis not present

## 2014-02-23 DIAGNOSIS — M150 Primary generalized (osteo)arthritis: Secondary | ICD-10-CM | POA: Diagnosis not present

## 2014-02-23 DIAGNOSIS — M869 Osteomyelitis, unspecified: Secondary | ICD-10-CM | POA: Diagnosis not present

## 2014-02-24 DIAGNOSIS — Z950 Presence of cardiac pacemaker: Secondary | ICD-10-CM | POA: Diagnosis not present

## 2014-02-24 DIAGNOSIS — I4891 Unspecified atrial fibrillation: Secondary | ICD-10-CM | POA: Diagnosis not present

## 2014-02-24 DIAGNOSIS — I359 Nonrheumatic aortic valve disorder, unspecified: Secondary | ICD-10-CM | POA: Diagnosis not present

## 2014-02-24 DIAGNOSIS — I119 Hypertensive heart disease without heart failure: Secondary | ICD-10-CM | POA: Diagnosis not present

## 2014-03-02 DIAGNOSIS — M150 Primary generalized (osteo)arthritis: Secondary | ICD-10-CM | POA: Diagnosis not present

## 2014-03-02 DIAGNOSIS — Z89521 Acquired absence of right knee: Secondary | ICD-10-CM | POA: Diagnosis not present

## 2014-03-02 DIAGNOSIS — Z452 Encounter for adjustment and management of vascular access device: Secondary | ICD-10-CM | POA: Diagnosis not present

## 2014-03-02 DIAGNOSIS — Z5181 Encounter for therapeutic drug level monitoring: Secondary | ICD-10-CM | POA: Diagnosis not present

## 2014-03-02 DIAGNOSIS — T8453XD Infection and inflammatory reaction due to internal right knee prosthesis, subsequent encounter: Secondary | ICD-10-CM | POA: Diagnosis not present

## 2014-03-02 DIAGNOSIS — I10 Essential (primary) hypertension: Secondary | ICD-10-CM | POA: Diagnosis not present

## 2014-03-05 DIAGNOSIS — Z01818 Encounter for other preprocedural examination: Secondary | ICD-10-CM | POA: Diagnosis not present

## 2014-03-05 DIAGNOSIS — M1711 Unilateral primary osteoarthritis, right knee: Secondary | ICD-10-CM | POA: Diagnosis not present

## 2014-03-09 DIAGNOSIS — Z89521 Acquired absence of right knee: Secondary | ICD-10-CM | POA: Diagnosis not present

## 2014-03-09 DIAGNOSIS — I10 Essential (primary) hypertension: Secondary | ICD-10-CM | POA: Diagnosis not present

## 2014-03-09 DIAGNOSIS — Z5181 Encounter for therapeutic drug level monitoring: Secondary | ICD-10-CM | POA: Diagnosis not present

## 2014-03-09 DIAGNOSIS — M150 Primary generalized (osteo)arthritis: Secondary | ICD-10-CM | POA: Diagnosis not present

## 2014-03-09 DIAGNOSIS — M21861 Other specified acquired deformities of right lower leg: Secondary | ICD-10-CM | POA: Diagnosis not present

## 2014-03-09 DIAGNOSIS — Z452 Encounter for adjustment and management of vascular access device: Secondary | ICD-10-CM | POA: Diagnosis not present

## 2014-03-09 DIAGNOSIS — T8453XD Infection and inflammatory reaction due to internal right knee prosthesis, subsequent encounter: Secondary | ICD-10-CM | POA: Diagnosis not present

## 2014-03-10 DIAGNOSIS — Z96651 Presence of right artificial knee joint: Secondary | ICD-10-CM | POA: Diagnosis not present

## 2014-03-10 DIAGNOSIS — G8918 Other acute postprocedural pain: Secondary | ICD-10-CM | POA: Diagnosis not present

## 2014-03-10 DIAGNOSIS — T8454XA Infection and inflammatory reaction due to internal left knee prosthesis, initial encounter: Secondary | ICD-10-CM | POA: Diagnosis not present

## 2014-03-10 DIAGNOSIS — M659 Synovitis and tenosynovitis, unspecified: Secondary | ICD-10-CM | POA: Diagnosis not present

## 2014-03-10 DIAGNOSIS — T8453XA Infection and inflammatory reaction due to internal right knee prosthesis, initial encounter: Secondary | ICD-10-CM | POA: Diagnosis not present

## 2014-03-10 DIAGNOSIS — M21861 Other specified acquired deformities of right lower leg: Secondary | ICD-10-CM | POA: Diagnosis not present

## 2014-03-10 DIAGNOSIS — I10 Essential (primary) hypertension: Secondary | ICD-10-CM | POA: Diagnosis present

## 2014-03-13 DIAGNOSIS — Z89521 Acquired absence of right knee: Secondary | ICD-10-CM | POA: Diagnosis not present

## 2014-03-13 DIAGNOSIS — Z452 Encounter for adjustment and management of vascular access device: Secondary | ICD-10-CM | POA: Diagnosis not present

## 2014-03-13 DIAGNOSIS — M150 Primary generalized (osteo)arthritis: Secondary | ICD-10-CM | POA: Diagnosis not present

## 2014-03-13 DIAGNOSIS — I10 Essential (primary) hypertension: Secondary | ICD-10-CM | POA: Diagnosis not present

## 2014-03-13 DIAGNOSIS — T8453XD Infection and inflammatory reaction due to internal right knee prosthesis, subsequent encounter: Secondary | ICD-10-CM | POA: Diagnosis not present

## 2014-03-13 DIAGNOSIS — Z5181 Encounter for therapeutic drug level monitoring: Secondary | ICD-10-CM | POA: Diagnosis not present

## 2014-03-14 DIAGNOSIS — I10 Essential (primary) hypertension: Secondary | ICD-10-CM | POA: Diagnosis not present

## 2014-03-14 DIAGNOSIS — T8453XD Infection and inflammatory reaction due to internal right knee prosthesis, subsequent encounter: Secondary | ICD-10-CM | POA: Diagnosis not present

## 2014-03-14 DIAGNOSIS — Z89521 Acquired absence of right knee: Secondary | ICD-10-CM | POA: Diagnosis not present

## 2014-03-14 DIAGNOSIS — M150 Primary generalized (osteo)arthritis: Secondary | ICD-10-CM | POA: Diagnosis not present

## 2014-03-14 DIAGNOSIS — Z452 Encounter for adjustment and management of vascular access device: Secondary | ICD-10-CM | POA: Diagnosis not present

## 2014-03-14 DIAGNOSIS — Z5181 Encounter for therapeutic drug level monitoring: Secondary | ICD-10-CM | POA: Diagnosis not present

## 2014-03-16 DIAGNOSIS — Z5181 Encounter for therapeutic drug level monitoring: Secondary | ICD-10-CM | POA: Diagnosis not present

## 2014-03-16 DIAGNOSIS — T8453XD Infection and inflammatory reaction due to internal right knee prosthesis, subsequent encounter: Secondary | ICD-10-CM | POA: Diagnosis not present

## 2014-03-16 DIAGNOSIS — I10 Essential (primary) hypertension: Secondary | ICD-10-CM | POA: Diagnosis not present

## 2014-03-16 DIAGNOSIS — Z89521 Acquired absence of right knee: Secondary | ICD-10-CM | POA: Diagnosis not present

## 2014-03-16 DIAGNOSIS — M150 Primary generalized (osteo)arthritis: Secondary | ICD-10-CM | POA: Diagnosis not present

## 2014-03-16 DIAGNOSIS — Z452 Encounter for adjustment and management of vascular access device: Secondary | ICD-10-CM | POA: Diagnosis not present

## 2014-03-17 DIAGNOSIS — Z89521 Acquired absence of right knee: Secondary | ICD-10-CM | POA: Diagnosis not present

## 2014-03-17 DIAGNOSIS — M150 Primary generalized (osteo)arthritis: Secondary | ICD-10-CM | POA: Diagnosis not present

## 2014-03-17 DIAGNOSIS — I10 Essential (primary) hypertension: Secondary | ICD-10-CM | POA: Diagnosis not present

## 2014-03-17 DIAGNOSIS — Z5181 Encounter for therapeutic drug level monitoring: Secondary | ICD-10-CM | POA: Diagnosis not present

## 2014-03-17 DIAGNOSIS — T8453XD Infection and inflammatory reaction due to internal right knee prosthesis, subsequent encounter: Secondary | ICD-10-CM | POA: Diagnosis not present

## 2014-03-17 DIAGNOSIS — Z452 Encounter for adjustment and management of vascular access device: Secondary | ICD-10-CM | POA: Diagnosis not present

## 2014-03-18 DIAGNOSIS — Z96651 Presence of right artificial knee joint: Secondary | ICD-10-CM | POA: Diagnosis not present

## 2014-03-18 DIAGNOSIS — Z4733 Aftercare following explantation of knee joint prosthesis: Secondary | ICD-10-CM | POA: Diagnosis not present

## 2014-03-18 DIAGNOSIS — K219 Gastro-esophageal reflux disease without esophagitis: Secondary | ICD-10-CM | POA: Diagnosis not present

## 2014-03-18 DIAGNOSIS — D638 Anemia in other chronic diseases classified elsewhere: Secondary | ICD-10-CM | POA: Diagnosis not present

## 2014-03-18 DIAGNOSIS — K279 Peptic ulcer, site unspecified, unspecified as acute or chronic, without hemorrhage or perforation: Secondary | ICD-10-CM | POA: Diagnosis not present

## 2014-03-18 DIAGNOSIS — I10 Essential (primary) hypertension: Secondary | ICD-10-CM | POA: Diagnosis not present

## 2014-03-18 DIAGNOSIS — M150 Primary generalized (osteo)arthritis: Secondary | ICD-10-CM | POA: Diagnosis not present

## 2014-03-19 DIAGNOSIS — Z4733 Aftercare following explantation of knee joint prosthesis: Secondary | ICD-10-CM | POA: Diagnosis not present

## 2014-03-19 DIAGNOSIS — M150 Primary generalized (osteo)arthritis: Secondary | ICD-10-CM | POA: Diagnosis not present

## 2014-03-19 DIAGNOSIS — K279 Peptic ulcer, site unspecified, unspecified as acute or chronic, without hemorrhage or perforation: Secondary | ICD-10-CM | POA: Diagnosis not present

## 2014-03-19 DIAGNOSIS — Z96651 Presence of right artificial knee joint: Secondary | ICD-10-CM | POA: Diagnosis not present

## 2014-03-19 DIAGNOSIS — I10 Essential (primary) hypertension: Secondary | ICD-10-CM | POA: Diagnosis not present

## 2014-03-19 DIAGNOSIS — D638 Anemia in other chronic diseases classified elsewhere: Secondary | ICD-10-CM | POA: Diagnosis not present

## 2014-03-20 DIAGNOSIS — I10 Essential (primary) hypertension: Secondary | ICD-10-CM | POA: Diagnosis not present

## 2014-03-20 DIAGNOSIS — D638 Anemia in other chronic diseases classified elsewhere: Secondary | ICD-10-CM | POA: Diagnosis not present

## 2014-03-20 DIAGNOSIS — K279 Peptic ulcer, site unspecified, unspecified as acute or chronic, without hemorrhage or perforation: Secondary | ICD-10-CM | POA: Diagnosis not present

## 2014-03-20 DIAGNOSIS — Z4733 Aftercare following explantation of knee joint prosthesis: Secondary | ICD-10-CM | POA: Diagnosis not present

## 2014-03-20 DIAGNOSIS — Z96651 Presence of right artificial knee joint: Secondary | ICD-10-CM | POA: Diagnosis not present

## 2014-03-20 DIAGNOSIS — M150 Primary generalized (osteo)arthritis: Secondary | ICD-10-CM | POA: Diagnosis not present

## 2014-03-23 DIAGNOSIS — K279 Peptic ulcer, site unspecified, unspecified as acute or chronic, without hemorrhage or perforation: Secondary | ICD-10-CM | POA: Diagnosis not present

## 2014-03-23 DIAGNOSIS — Z96651 Presence of right artificial knee joint: Secondary | ICD-10-CM | POA: Diagnosis not present

## 2014-03-23 DIAGNOSIS — Z4733 Aftercare following explantation of knee joint prosthesis: Secondary | ICD-10-CM | POA: Diagnosis not present

## 2014-03-23 DIAGNOSIS — D638 Anemia in other chronic diseases classified elsewhere: Secondary | ICD-10-CM | POA: Diagnosis not present

## 2014-03-23 DIAGNOSIS — I10 Essential (primary) hypertension: Secondary | ICD-10-CM | POA: Diagnosis not present

## 2014-03-23 DIAGNOSIS — M150 Primary generalized (osteo)arthritis: Secondary | ICD-10-CM | POA: Diagnosis not present

## 2014-03-24 DIAGNOSIS — Z96651 Presence of right artificial knee joint: Secondary | ICD-10-CM | POA: Diagnosis not present

## 2014-03-24 DIAGNOSIS — Z471 Aftercare following joint replacement surgery: Secondary | ICD-10-CM | POA: Diagnosis not present

## 2014-03-25 DIAGNOSIS — K279 Peptic ulcer, site unspecified, unspecified as acute or chronic, without hemorrhage or perforation: Secondary | ICD-10-CM | POA: Diagnosis not present

## 2014-03-25 DIAGNOSIS — D638 Anemia in other chronic diseases classified elsewhere: Secondary | ICD-10-CM | POA: Diagnosis not present

## 2014-03-25 DIAGNOSIS — I10 Essential (primary) hypertension: Secondary | ICD-10-CM | POA: Diagnosis not present

## 2014-03-25 DIAGNOSIS — Z4733 Aftercare following explantation of knee joint prosthesis: Secondary | ICD-10-CM | POA: Diagnosis not present

## 2014-03-25 DIAGNOSIS — Z96651 Presence of right artificial knee joint: Secondary | ICD-10-CM | POA: Diagnosis not present

## 2014-03-25 DIAGNOSIS — M150 Primary generalized (osteo)arthritis: Secondary | ICD-10-CM | POA: Diagnosis not present

## 2014-03-26 DIAGNOSIS — K279 Peptic ulcer, site unspecified, unspecified as acute or chronic, without hemorrhage or perforation: Secondary | ICD-10-CM | POA: Diagnosis not present

## 2014-03-26 DIAGNOSIS — Z96651 Presence of right artificial knee joint: Secondary | ICD-10-CM | POA: Diagnosis not present

## 2014-03-26 DIAGNOSIS — I10 Essential (primary) hypertension: Secondary | ICD-10-CM | POA: Diagnosis not present

## 2014-03-26 DIAGNOSIS — Z4733 Aftercare following explantation of knee joint prosthesis: Secondary | ICD-10-CM | POA: Diagnosis not present

## 2014-03-26 DIAGNOSIS — M150 Primary generalized (osteo)arthritis: Secondary | ICD-10-CM | POA: Diagnosis not present

## 2014-03-26 DIAGNOSIS — D638 Anemia in other chronic diseases classified elsewhere: Secondary | ICD-10-CM | POA: Diagnosis not present

## 2014-03-27 DIAGNOSIS — Z96651 Presence of right artificial knee joint: Secondary | ICD-10-CM | POA: Diagnosis not present

## 2014-03-27 DIAGNOSIS — M150 Primary generalized (osteo)arthritis: Secondary | ICD-10-CM | POA: Diagnosis not present

## 2014-03-27 DIAGNOSIS — D638 Anemia in other chronic diseases classified elsewhere: Secondary | ICD-10-CM | POA: Diagnosis not present

## 2014-03-27 DIAGNOSIS — K279 Peptic ulcer, site unspecified, unspecified as acute or chronic, without hemorrhage or perforation: Secondary | ICD-10-CM | POA: Diagnosis not present

## 2014-03-27 DIAGNOSIS — I10 Essential (primary) hypertension: Secondary | ICD-10-CM | POA: Diagnosis not present

## 2014-03-27 DIAGNOSIS — Z4733 Aftercare following explantation of knee joint prosthesis: Secondary | ICD-10-CM | POA: Diagnosis not present

## 2014-03-31 DIAGNOSIS — I10 Essential (primary) hypertension: Secondary | ICD-10-CM | POA: Diagnosis not present

## 2014-03-31 DIAGNOSIS — M51379 Other intervertebral disc degeneration, lumbosacral region without mention of lumbar back pain or lower extremity pain: Secondary | ICD-10-CM

## 2014-03-31 DIAGNOSIS — M4807 Spinal stenosis, lumbosacral region: Secondary | ICD-10-CM | POA: Diagnosis not present

## 2014-03-31 DIAGNOSIS — Z6828 Body mass index (BMI) 28.0-28.9, adult: Secondary | ICD-10-CM | POA: Diagnosis not present

## 2014-03-31 DIAGNOSIS — M5137 Other intervertebral disc degeneration, lumbosacral region: Secondary | ICD-10-CM

## 2014-03-31 DIAGNOSIS — M4800 Spinal stenosis, site unspecified: Secondary | ICD-10-CM

## 2014-03-31 DIAGNOSIS — M5136 Other intervertebral disc degeneration, lumbar region: Secondary | ICD-10-CM | POA: Diagnosis not present

## 2014-03-31 HISTORY — DX: Other intervertebral disc degeneration, lumbosacral region: M51.37

## 2014-03-31 HISTORY — DX: Other intervertebral disc degeneration, lumbosacral region without mention of lumbar back pain or lower extremity pain: M51.379

## 2014-03-31 HISTORY — DX: Spinal stenosis, site unspecified: M48.00

## 2014-04-13 DIAGNOSIS — Z4733 Aftercare following explantation of knee joint prosthesis: Secondary | ICD-10-CM | POA: Diagnosis not present

## 2014-05-11 DIAGNOSIS — M25561 Pain in right knee: Secondary | ICD-10-CM | POA: Diagnosis not present

## 2014-05-13 DIAGNOSIS — M25561 Pain in right knee: Secondary | ICD-10-CM | POA: Diagnosis not present

## 2014-05-15 DIAGNOSIS — M7540 Impingement syndrome of unspecified shoulder: Secondary | ICD-10-CM | POA: Diagnosis not present

## 2014-05-15 DIAGNOSIS — M25512 Pain in left shoulder: Secondary | ICD-10-CM | POA: Diagnosis not present

## 2014-05-15 DIAGNOSIS — Z471 Aftercare following joint replacement surgery: Secondary | ICD-10-CM | POA: Diagnosis not present

## 2014-05-15 DIAGNOSIS — M25511 Pain in right shoulder: Secondary | ICD-10-CM | POA: Diagnosis not present

## 2014-05-17 DIAGNOSIS — M7540 Impingement syndrome of unspecified shoulder: Secondary | ICD-10-CM

## 2014-05-17 DIAGNOSIS — M25819 Other specified joint disorders, unspecified shoulder: Secondary | ICD-10-CM

## 2014-05-17 HISTORY — DX: Other specified joint disorders, unspecified shoulder: M25.819

## 2014-05-17 HISTORY — DX: Impingement syndrome of unspecified shoulder: M75.40

## 2014-05-20 DIAGNOSIS — I498 Other specified cardiac arrhythmias: Secondary | ICD-10-CM | POA: Diagnosis not present

## 2014-07-17 DIAGNOSIS — M7541 Impingement syndrome of right shoulder: Secondary | ICD-10-CM | POA: Diagnosis not present

## 2014-07-17 DIAGNOSIS — S8000XA Contusion of unspecified knee, initial encounter: Secondary | ICD-10-CM | POA: Diagnosis not present

## 2014-07-17 DIAGNOSIS — M7542 Impingement syndrome of left shoulder: Secondary | ICD-10-CM | POA: Diagnosis not present

## 2014-07-23 DIAGNOSIS — M4120 Other idiopathic scoliosis, site unspecified: Secondary | ICD-10-CM | POA: Diagnosis not present

## 2014-07-23 DIAGNOSIS — M5136 Other intervertebral disc degeneration, lumbar region: Secondary | ICD-10-CM | POA: Diagnosis not present

## 2014-08-18 DIAGNOSIS — Z45018 Encounter for adjustment and management of other part of cardiac pacemaker: Secondary | ICD-10-CM | POA: Insufficient documentation

## 2014-08-18 DIAGNOSIS — Z950 Presence of cardiac pacemaker: Secondary | ICD-10-CM | POA: Diagnosis not present

## 2014-08-18 DIAGNOSIS — I480 Paroxysmal atrial fibrillation: Secondary | ICD-10-CM | POA: Insufficient documentation

## 2014-08-18 DIAGNOSIS — R079 Chest pain, unspecified: Secondary | ICD-10-CM | POA: Insufficient documentation

## 2014-08-18 DIAGNOSIS — I10 Essential (primary) hypertension: Secondary | ICD-10-CM | POA: Diagnosis not present

## 2014-08-18 HISTORY — DX: Encounter for adjustment and management of other part of cardiac pacemaker: Z45.018

## 2014-08-18 HISTORY — DX: Paroxysmal atrial fibrillation: I48.0

## 2014-08-18 HISTORY — DX: Chest pain, unspecified: R07.9

## 2014-08-20 DIAGNOSIS — I498 Other specified cardiac arrhythmias: Secondary | ICD-10-CM | POA: Diagnosis not present

## 2014-08-20 DIAGNOSIS — Z45010 Encounter for checking and testing of cardiac pacemaker pulse generator [battery]: Secondary | ICD-10-CM | POA: Diagnosis not present

## 2014-08-25 DIAGNOSIS — I252 Old myocardial infarction: Secondary | ICD-10-CM | POA: Diagnosis not present

## 2014-08-25 DIAGNOSIS — R079 Chest pain, unspecified: Secondary | ICD-10-CM | POA: Diagnosis not present

## 2014-08-26 DIAGNOSIS — Z950 Presence of cardiac pacemaker: Secondary | ICD-10-CM | POA: Diagnosis not present

## 2014-08-26 DIAGNOSIS — R079 Chest pain, unspecified: Secondary | ICD-10-CM | POA: Diagnosis not present

## 2014-08-26 DIAGNOSIS — I10 Essential (primary) hypertension: Secondary | ICD-10-CM | POA: Diagnosis not present

## 2014-08-26 DIAGNOSIS — I480 Paroxysmal atrial fibrillation: Secondary | ICD-10-CM | POA: Diagnosis not present

## 2014-09-01 DIAGNOSIS — K219 Gastro-esophageal reflux disease without esophagitis: Secondary | ICD-10-CM | POA: Diagnosis not present

## 2014-09-01 DIAGNOSIS — R1013 Epigastric pain: Secondary | ICD-10-CM | POA: Diagnosis not present

## 2014-09-04 DIAGNOSIS — K219 Gastro-esophageal reflux disease without esophagitis: Secondary | ICD-10-CM | POA: Diagnosis not present

## 2014-09-04 DIAGNOSIS — R1310 Dysphagia, unspecified: Secondary | ICD-10-CM | POA: Diagnosis not present

## 2014-10-22 DIAGNOSIS — M542 Cervicalgia: Secondary | ICD-10-CM | POA: Diagnosis not present

## 2014-10-22 DIAGNOSIS — M5020 Other cervical disc displacement, unspecified cervical region: Secondary | ICD-10-CM

## 2014-10-22 DIAGNOSIS — M4120 Other idiopathic scoliosis, site unspecified: Secondary | ICD-10-CM | POA: Diagnosis not present

## 2014-10-22 DIAGNOSIS — M5136 Other intervertebral disc degeneration, lumbar region: Secondary | ICD-10-CM | POA: Diagnosis not present

## 2014-10-22 HISTORY — DX: Other cervical disc displacement, unspecified cervical region: M50.20

## 2014-10-22 HISTORY — DX: Cervicalgia: M54.2

## 2014-10-30 DIAGNOSIS — M19012 Primary osteoarthritis, left shoulder: Secondary | ICD-10-CM | POA: Diagnosis not present

## 2014-10-30 DIAGNOSIS — M19011 Primary osteoarthritis, right shoulder: Secondary | ICD-10-CM | POA: Diagnosis not present

## 2014-11-06 DIAGNOSIS — M8580 Other specified disorders of bone density and structure, unspecified site: Secondary | ICD-10-CM | POA: Diagnosis not present

## 2014-11-06 DIAGNOSIS — E559 Vitamin D deficiency, unspecified: Secondary | ICD-10-CM | POA: Diagnosis not present

## 2014-11-06 DIAGNOSIS — Z9181 History of falling: Secondary | ICD-10-CM | POA: Diagnosis not present

## 2014-11-06 DIAGNOSIS — N189 Chronic kidney disease, unspecified: Secondary | ICD-10-CM | POA: Diagnosis not present

## 2014-11-06 DIAGNOSIS — K219 Gastro-esophageal reflux disease without esophagitis: Secondary | ICD-10-CM | POA: Diagnosis not present

## 2014-11-06 DIAGNOSIS — I10 Essential (primary) hypertension: Secondary | ICD-10-CM | POA: Diagnosis not present

## 2014-11-06 DIAGNOSIS — D519 Vitamin B12 deficiency anemia, unspecified: Secondary | ICD-10-CM | POA: Diagnosis not present

## 2014-11-06 DIAGNOSIS — Z6830 Body mass index (BMI) 30.0-30.9, adult: Secondary | ICD-10-CM | POA: Diagnosis not present

## 2014-11-21 DIAGNOSIS — Z23 Encounter for immunization: Secondary | ICD-10-CM | POA: Diagnosis not present

## 2014-11-24 DIAGNOSIS — Z45010 Encounter for checking and testing of cardiac pacemaker pulse generator [battery]: Secondary | ICD-10-CM | POA: Diagnosis not present

## 2014-11-24 DIAGNOSIS — I498 Other specified cardiac arrhythmias: Secondary | ICD-10-CM | POA: Diagnosis not present

## 2014-11-26 DIAGNOSIS — M5137 Other intervertebral disc degeneration, lumbosacral region: Secondary | ICD-10-CM | POA: Diagnosis not present

## 2014-11-26 DIAGNOSIS — Z6830 Body mass index (BMI) 30.0-30.9, adult: Secondary | ICD-10-CM | POA: Diagnosis not present

## 2014-11-26 DIAGNOSIS — I10 Essential (primary) hypertension: Secondary | ICD-10-CM | POA: Diagnosis not present

## 2014-12-02 DIAGNOSIS — M6281 Muscle weakness (generalized): Secondary | ICD-10-CM | POA: Diagnosis not present

## 2014-12-02 DIAGNOSIS — S39012S Strain of muscle, fascia and tendon of lower back, sequela: Secondary | ICD-10-CM | POA: Diagnosis not present

## 2014-12-04 DIAGNOSIS — M6281 Muscle weakness (generalized): Secondary | ICD-10-CM | POA: Diagnosis not present

## 2014-12-04 DIAGNOSIS — S39012S Strain of muscle, fascia and tendon of lower back, sequela: Secondary | ICD-10-CM | POA: Diagnosis not present

## 2014-12-08 DIAGNOSIS — M6281 Muscle weakness (generalized): Secondary | ICD-10-CM | POA: Diagnosis not present

## 2014-12-08 DIAGNOSIS — S39012S Strain of muscle, fascia and tendon of lower back, sequela: Secondary | ICD-10-CM | POA: Diagnosis not present

## 2014-12-11 DIAGNOSIS — S39012S Strain of muscle, fascia and tendon of lower back, sequela: Secondary | ICD-10-CM | POA: Diagnosis not present

## 2014-12-11 DIAGNOSIS — M6281 Muscle weakness (generalized): Secondary | ICD-10-CM | POA: Diagnosis not present

## 2014-12-14 DIAGNOSIS — M6281 Muscle weakness (generalized): Secondary | ICD-10-CM | POA: Diagnosis not present

## 2014-12-14 DIAGNOSIS — S39012S Strain of muscle, fascia and tendon of lower back, sequela: Secondary | ICD-10-CM | POA: Diagnosis not present

## 2014-12-16 DIAGNOSIS — M6281 Muscle weakness (generalized): Secondary | ICD-10-CM | POA: Diagnosis not present

## 2014-12-16 DIAGNOSIS — S39012S Strain of muscle, fascia and tendon of lower back, sequela: Secondary | ICD-10-CM | POA: Diagnosis not present

## 2014-12-18 DIAGNOSIS — M7542 Impingement syndrome of left shoulder: Secondary | ICD-10-CM | POA: Diagnosis not present

## 2014-12-18 DIAGNOSIS — M7541 Impingement syndrome of right shoulder: Secondary | ICD-10-CM | POA: Diagnosis not present

## 2014-12-18 DIAGNOSIS — Z471 Aftercare following joint replacement surgery: Secondary | ICD-10-CM | POA: Diagnosis not present

## 2014-12-18 DIAGNOSIS — M1712 Unilateral primary osteoarthritis, left knee: Secondary | ICD-10-CM | POA: Diagnosis not present

## 2014-12-21 DIAGNOSIS — M6281 Muscle weakness (generalized): Secondary | ICD-10-CM | POA: Diagnosis not present

## 2014-12-21 DIAGNOSIS — S39012S Strain of muscle, fascia and tendon of lower back, sequela: Secondary | ICD-10-CM | POA: Diagnosis not present

## 2014-12-23 DIAGNOSIS — M6281 Muscle weakness (generalized): Secondary | ICD-10-CM | POA: Diagnosis not present

## 2014-12-23 DIAGNOSIS — S39012S Strain of muscle, fascia and tendon of lower back, sequela: Secondary | ICD-10-CM | POA: Diagnosis not present

## 2014-12-28 DIAGNOSIS — M6281 Muscle weakness (generalized): Secondary | ICD-10-CM | POA: Diagnosis not present

## 2014-12-28 DIAGNOSIS — S39012S Strain of muscle, fascia and tendon of lower back, sequela: Secondary | ICD-10-CM | POA: Diagnosis not present

## 2014-12-31 DIAGNOSIS — M6281 Muscle weakness (generalized): Secondary | ICD-10-CM | POA: Diagnosis not present

## 2014-12-31 DIAGNOSIS — S39012S Strain of muscle, fascia and tendon of lower back, sequela: Secondary | ICD-10-CM | POA: Diagnosis not present

## 2015-01-04 DIAGNOSIS — S39012S Strain of muscle, fascia and tendon of lower back, sequela: Secondary | ICD-10-CM | POA: Diagnosis not present

## 2015-01-04 DIAGNOSIS — M6281 Muscle weakness (generalized): Secondary | ICD-10-CM | POA: Diagnosis not present

## 2015-01-07 DIAGNOSIS — M6281 Muscle weakness (generalized): Secondary | ICD-10-CM | POA: Diagnosis not present

## 2015-01-07 DIAGNOSIS — S39012S Strain of muscle, fascia and tendon of lower back, sequela: Secondary | ICD-10-CM | POA: Diagnosis not present

## 2015-01-11 DIAGNOSIS — S39012S Strain of muscle, fascia and tendon of lower back, sequela: Secondary | ICD-10-CM | POA: Diagnosis not present

## 2015-01-11 DIAGNOSIS — M6281 Muscle weakness (generalized): Secondary | ICD-10-CM | POA: Diagnosis not present

## 2015-01-13 DIAGNOSIS — S39012S Strain of muscle, fascia and tendon of lower back, sequela: Secondary | ICD-10-CM | POA: Diagnosis not present

## 2015-01-13 DIAGNOSIS — M6281 Muscle weakness (generalized): Secondary | ICD-10-CM | POA: Diagnosis not present

## 2015-02-22 DIAGNOSIS — I498 Other specified cardiac arrhythmias: Secondary | ICD-10-CM | POA: Diagnosis not present

## 2015-02-22 DIAGNOSIS — Z45010 Encounter for checking and testing of cardiac pacemaker pulse generator [battery]: Secondary | ICD-10-CM | POA: Diagnosis not present

## 2015-02-25 DIAGNOSIS — Z471 Aftercare following joint replacement surgery: Secondary | ICD-10-CM | POA: Diagnosis not present

## 2015-02-25 DIAGNOSIS — M7542 Impingement syndrome of left shoulder: Secondary | ICD-10-CM | POA: Diagnosis not present

## 2015-02-25 DIAGNOSIS — M12811 Other specific arthropathies, not elsewhere classified, right shoulder: Secondary | ICD-10-CM | POA: Diagnosis not present

## 2015-02-25 DIAGNOSIS — Z96651 Presence of right artificial knee joint: Secondary | ICD-10-CM | POA: Diagnosis not present

## 2015-02-25 DIAGNOSIS — M1712 Unilateral primary osteoarthritis, left knee: Secondary | ICD-10-CM | POA: Diagnosis not present

## 2015-03-02 DIAGNOSIS — M4803 Spinal stenosis, cervicothoracic region: Secondary | ICD-10-CM | POA: Diagnosis not present

## 2015-03-02 DIAGNOSIS — M5137 Other intervertebral disc degeneration, lumbosacral region: Secondary | ICD-10-CM | POA: Diagnosis not present

## 2015-03-02 DIAGNOSIS — M4807 Spinal stenosis, lumbosacral region: Secondary | ICD-10-CM | POA: Diagnosis not present

## 2015-04-07 DIAGNOSIS — Z6830 Body mass index (BMI) 30.0-30.9, adult: Secondary | ICD-10-CM | POA: Diagnosis not present

## 2015-04-07 DIAGNOSIS — E559 Vitamin D deficiency, unspecified: Secondary | ICD-10-CM | POA: Diagnosis not present

## 2015-04-07 DIAGNOSIS — I480 Paroxysmal atrial fibrillation: Secondary | ICD-10-CM | POA: Diagnosis not present

## 2015-04-07 DIAGNOSIS — M479 Spondylosis, unspecified: Secondary | ICD-10-CM | POA: Diagnosis not present

## 2015-04-07 DIAGNOSIS — I10 Essential (primary) hypertension: Secondary | ICD-10-CM | POA: Diagnosis not present

## 2015-04-07 DIAGNOSIS — N189 Chronic kidney disease, unspecified: Secondary | ICD-10-CM | POA: Diagnosis not present

## 2015-04-07 DIAGNOSIS — K279 Peptic ulcer, site unspecified, unspecified as acute or chronic, without hemorrhage or perforation: Secondary | ICD-10-CM | POA: Diagnosis not present

## 2015-04-07 DIAGNOSIS — D519 Vitamin B12 deficiency anemia, unspecified: Secondary | ICD-10-CM | POA: Diagnosis not present

## 2015-04-07 DIAGNOSIS — M170 Bilateral primary osteoarthritis of knee: Secondary | ICD-10-CM | POA: Diagnosis not present

## 2015-04-20 DIAGNOSIS — M12819 Other specific arthropathies, not elsewhere classified, unspecified shoulder: Secondary | ICD-10-CM | POA: Diagnosis not present

## 2015-04-27 DIAGNOSIS — M542 Cervicalgia: Secondary | ICD-10-CM | POA: Diagnosis not present

## 2015-04-27 DIAGNOSIS — M5137 Other intervertebral disc degeneration, lumbosacral region: Secondary | ICD-10-CM | POA: Diagnosis not present

## 2015-04-27 DIAGNOSIS — M79603 Pain in arm, unspecified: Secondary | ICD-10-CM | POA: Insufficient documentation

## 2015-04-27 DIAGNOSIS — R202 Paresthesia of skin: Secondary | ICD-10-CM | POA: Diagnosis not present

## 2015-04-27 HISTORY — DX: Pain in arm, unspecified: M79.603

## 2015-05-17 ENCOUNTER — Ambulatory Visit (HOSPITAL_COMMUNITY)
Admission: RE | Admit: 2015-05-17 | Discharge: 2015-05-17 | Disposition: A | Discharge status: Home / Self Care | Payer: Medicare Other | Source: Ambulatory Visit | Attending: Surgery | Admitting: Surgery

## 2015-05-17 ENCOUNTER — Other Ambulatory Visit (HOSPITAL_COMMUNITY): Payer: Self-pay | Admitting: Neurosurgery

## 2015-05-17 DIAGNOSIS — R202 Paresthesia of skin: Secondary | ICD-10-CM | POA: Diagnosis not present

## 2015-05-17 DIAGNOSIS — I10 Essential (primary) hypertension: Secondary | ICD-10-CM | POA: Insufficient documentation

## 2015-05-17 DIAGNOSIS — R200 Anesthesia of skin: Secondary | ICD-10-CM

## 2015-05-20 DIAGNOSIS — M5023 Other cervical disc displacement, cervicothoracic region: Secondary | ICD-10-CM | POA: Diagnosis not present

## 2015-05-20 DIAGNOSIS — M542 Cervicalgia: Secondary | ICD-10-CM | POA: Diagnosis not present

## 2015-05-27 DIAGNOSIS — M25512 Pain in left shoulder: Secondary | ICD-10-CM | POA: Diagnosis not present

## 2015-05-27 DIAGNOSIS — M25522 Pain in left elbow: Secondary | ICD-10-CM | POA: Diagnosis not present

## 2015-05-27 DIAGNOSIS — M25511 Pain in right shoulder: Secondary | ICD-10-CM | POA: Diagnosis not present

## 2015-05-27 DIAGNOSIS — M542 Cervicalgia: Secondary | ICD-10-CM | POA: Diagnosis not present

## 2015-05-31 DIAGNOSIS — J208 Acute bronchitis due to other specified organisms: Secondary | ICD-10-CM | POA: Diagnosis not present

## 2015-05-31 DIAGNOSIS — Z6830 Body mass index (BMI) 30.0-30.9, adult: Secondary | ICD-10-CM | POA: Diagnosis not present

## 2015-05-31 DIAGNOSIS — I498 Other specified cardiac arrhythmias: Secondary | ICD-10-CM | POA: Diagnosis not present

## 2015-05-31 DIAGNOSIS — Z45010 Encounter for checking and testing of cardiac pacemaker pulse generator [battery]: Secondary | ICD-10-CM | POA: Diagnosis not present

## 2015-06-11 DIAGNOSIS — Z6829 Body mass index (BMI) 29.0-29.9, adult: Secondary | ICD-10-CM | POA: Diagnosis not present

## 2015-06-11 DIAGNOSIS — J208 Acute bronchitis due to other specified organisms: Secondary | ICD-10-CM | POA: Diagnosis not present

## 2015-06-23 DIAGNOSIS — R079 Chest pain, unspecified: Secondary | ICD-10-CM | POA: Diagnosis not present

## 2015-06-23 DIAGNOSIS — J9811 Atelectasis: Secondary | ICD-10-CM | POA: Diagnosis not present

## 2015-07-21 DIAGNOSIS — I480 Paroxysmal atrial fibrillation: Secondary | ICD-10-CM | POA: Diagnosis not present

## 2015-07-21 DIAGNOSIS — I10 Essential (primary) hypertension: Secondary | ICD-10-CM | POA: Diagnosis not present

## 2015-07-27 ENCOUNTER — Other Ambulatory Visit: Payer: Self-pay | Admitting: Neurosurgery

## 2015-07-27 DIAGNOSIS — I10 Essential (primary) hypertension: Secondary | ICD-10-CM | POA: Diagnosis not present

## 2015-07-27 DIAGNOSIS — Z6829 Body mass index (BMI) 29.0-29.9, adult: Secondary | ICD-10-CM | POA: Diagnosis not present

## 2015-07-27 DIAGNOSIS — M25511 Pain in right shoulder: Secondary | ICD-10-CM

## 2015-07-27 DIAGNOSIS — M25512 Pain in left shoulder: Principal | ICD-10-CM

## 2015-07-30 ENCOUNTER — Ambulatory Visit
Admission: RE | Admit: 2015-07-30 | Discharge: 2015-07-30 | Disposition: A | Discharge status: Home / Self Care | Payer: Medicare Other | Source: Ambulatory Visit | Attending: Neurosurgery | Admitting: Neurosurgery

## 2015-07-30 DIAGNOSIS — M25512 Pain in left shoulder: Principal | ICD-10-CM

## 2015-07-30 DIAGNOSIS — M4324 Fusion of spine, thoracic region: Secondary | ICD-10-CM | POA: Diagnosis not present

## 2015-07-30 DIAGNOSIS — M25511 Pain in right shoulder: Secondary | ICD-10-CM | POA: Diagnosis not present

## 2015-07-30 DIAGNOSIS — M5023 Other cervical disc displacement, cervicothoracic region: Secondary | ICD-10-CM | POA: Diagnosis not present

## 2015-08-10 DIAGNOSIS — M12819 Other specific arthropathies, not elsewhere classified, unspecified shoulder: Secondary | ICD-10-CM | POA: Diagnosis not present

## 2015-08-19 DIAGNOSIS — M5023 Other cervical disc displacement, cervicothoracic region: Secondary | ICD-10-CM | POA: Diagnosis not present

## 2015-08-19 DIAGNOSIS — M5137 Other intervertebral disc degeneration, lumbosacral region: Secondary | ICD-10-CM | POA: Diagnosis not present

## 2015-08-26 DIAGNOSIS — M5137 Other intervertebral disc degeneration, lumbosacral region: Secondary | ICD-10-CM | POA: Diagnosis not present

## 2015-08-31 DIAGNOSIS — M5137 Other intervertebral disc degeneration, lumbosacral region: Secondary | ICD-10-CM | POA: Diagnosis not present

## 2015-09-02 DIAGNOSIS — M5137 Other intervertebral disc degeneration, lumbosacral region: Secondary | ICD-10-CM | POA: Diagnosis not present

## 2015-09-06 DIAGNOSIS — Z950 Presence of cardiac pacemaker: Secondary | ICD-10-CM | POA: Diagnosis not present

## 2015-09-07 DIAGNOSIS — M5137 Other intervertebral disc degeneration, lumbosacral region: Secondary | ICD-10-CM | POA: Diagnosis not present

## 2015-09-09 DIAGNOSIS — M5137 Other intervertebral disc degeneration, lumbosacral region: Secondary | ICD-10-CM | POA: Diagnosis not present

## 2015-09-13 DIAGNOSIS — M5137 Other intervertebral disc degeneration, lumbosacral region: Secondary | ICD-10-CM | POA: Diagnosis not present

## 2015-09-14 DIAGNOSIS — M12811 Other specific arthropathies, not elsewhere classified, right shoulder: Secondary | ICD-10-CM | POA: Diagnosis not present

## 2015-09-14 DIAGNOSIS — M12812 Other specific arthropathies, not elsewhere classified, left shoulder: Secondary | ICD-10-CM | POA: Diagnosis not present

## 2015-09-14 DIAGNOSIS — M5416 Radiculopathy, lumbar region: Secondary | ICD-10-CM | POA: Diagnosis not present

## 2015-09-14 DIAGNOSIS — M25552 Pain in left hip: Secondary | ICD-10-CM | POA: Diagnosis not present

## 2015-09-16 DIAGNOSIS — M5137 Other intervertebral disc degeneration, lumbosacral region: Secondary | ICD-10-CM | POA: Diagnosis not present

## 2015-09-28 DIAGNOSIS — M19011 Primary osteoarthritis, right shoulder: Secondary | ICD-10-CM | POA: Diagnosis not present

## 2015-09-28 DIAGNOSIS — M19012 Primary osteoarthritis, left shoulder: Secondary | ICD-10-CM | POA: Diagnosis not present

## 2015-09-29 ENCOUNTER — Other Ambulatory Visit: Payer: Self-pay | Admitting: Orthopedic Surgery

## 2015-09-29 DIAGNOSIS — M25511 Pain in right shoulder: Secondary | ICD-10-CM | POA: Diagnosis not present

## 2015-10-04 ENCOUNTER — Ambulatory Visit
Admission: RE | Admit: 2015-10-04 | Discharge: 2015-10-04 | Disposition: A | Discharge status: Home / Self Care | Payer: Medicare Other | Source: Ambulatory Visit | Attending: Orthopedic Surgery | Admitting: Orthopedic Surgery

## 2015-10-04 DIAGNOSIS — M25511 Pain in right shoulder: Secondary | ICD-10-CM

## 2015-10-04 DIAGNOSIS — M19011 Primary osteoarthritis, right shoulder: Secondary | ICD-10-CM | POA: Diagnosis not present

## 2015-10-04 DIAGNOSIS — Z01818 Encounter for other preprocedural examination: Secondary | ICD-10-CM | POA: Diagnosis not present

## 2015-10-05 DIAGNOSIS — E785 Hyperlipidemia, unspecified: Secondary | ICD-10-CM | POA: Diagnosis not present

## 2015-10-05 DIAGNOSIS — M67911 Unspecified disorder of synovium and tendon, right shoulder: Secondary | ICD-10-CM | POA: Diagnosis not present

## 2015-10-05 DIAGNOSIS — E663 Overweight: Secondary | ICD-10-CM | POA: Diagnosis not present

## 2015-10-05 DIAGNOSIS — D519 Vitamin B12 deficiency anemia, unspecified: Secondary | ICD-10-CM | POA: Diagnosis not present

## 2015-10-05 DIAGNOSIS — Z6829 Body mass index (BMI) 29.0-29.9, adult: Secondary | ICD-10-CM | POA: Diagnosis not present

## 2015-10-05 DIAGNOSIS — E559 Vitamin D deficiency, unspecified: Secondary | ICD-10-CM | POA: Diagnosis not present

## 2015-10-05 DIAGNOSIS — I480 Paroxysmal atrial fibrillation: Secondary | ICD-10-CM | POA: Diagnosis not present

## 2015-10-05 DIAGNOSIS — I10 Essential (primary) hypertension: Secondary | ICD-10-CM | POA: Diagnosis not present

## 2015-10-05 DIAGNOSIS — D638 Anemia in other chronic diseases classified elsewhere: Secondary | ICD-10-CM | POA: Diagnosis not present

## 2015-10-07 DIAGNOSIS — M5137 Other intervertebral disc degeneration, lumbosacral region: Secondary | ICD-10-CM | POA: Diagnosis not present

## 2015-10-14 DIAGNOSIS — R079 Chest pain, unspecified: Secondary | ICD-10-CM | POA: Diagnosis not present

## 2015-10-19 ENCOUNTER — Other Ambulatory Visit: Payer: Self-pay | Admitting: Orthopedic Surgery

## 2015-11-04 ENCOUNTER — Other Ambulatory Visit (HOSPITAL_COMMUNITY): Payer: Self-pay | Admitting: *Deleted

## 2015-11-04 NOTE — Pre-Procedure Instructions (Signed)
[name]  11/04/2015    Your procedure is scheduled on Tuesday, November 16, 2015 at 11:00 AM.   Report to [facility] Entrance "A" Admitting Office at 9:00 AM.   Call this number if you have problems the morning of surgery: [phone]   Questions prior to day of surgery, please call [phone] between 8 & 4 PM.   Remember:   Do not eat food or drink liquids after midnight Monday, 11/15/15.  Take these medicines the morning of surgery with A SIP OF WATER: Flecainide (Tambocor), Omeprazole (Prilosec), Oxycodone - if needed  Stop Aspirin, NSAIDS (Celebrex, Ibuprofen, Aleve, etc), Multivitamins and Herbal Medications 7 days prior to surgery.   Do not wear jewelry, make-up or nail polish.  Do not wear lotions, powders, or perfumes.  You may NOT wear deodorant.  Do not shave 48 hours prior to surgery.   Do not bring valuables to the hospital.  [facility] is not responsible for any belongings or valuables.  Contacts, dentures or bridgework may not be worn into surgery.  Leave your suitcase in the car.  After surgery it may be brought to your room.  For patients admitted to the hospital, discharge time will be determined by your treatment team.  Special instructions:  [place] - Preparing for Surgery  Before surgery, you can play an important role.  Because skin is not sterile, your skin needs to be as free of germs as possible.  You can reduce the number of germs on you skin by washing with CHG (chlorahexidine gluconate) soap before surgery.  CHG is an antiseptic cleaner which kills germs and bonds with the skin to continue killing germs even after washing.  Please DO NOT use if you have an allergy to CHG or antibacterial soaps.  If your skin becomes reddened/irritated stop using the CHG and inform your nurse when you arrive at Short Stay.  Do not shave (including legs and underarms) for at least 48 hours prior to the first CHG shower.  You may shave your  face.  Please follow these instructions carefully:   1.  Shower with CHG Soap the night before surgery and the                    morning of Surgery.  2.  If you choose to wash your hair, wash your hair first as usual with your       normal shampoo.  3.  After you shampoo, rinse your hair and body thoroughly to remove the shampoo.  4.  Use CHG as you would any other liquid soap.  You can apply chg directly       to the skin and wash gently with scrungie or a clean washcloth.  5.  Apply the CHG Soap to your body ONLY FROM THE NECK DOWN.        Do not use on open wounds or open sores.  Avoid contact with your eyes, ears, mouth and genitals (private parts).  Wash genitals (private parts) with your normal soap.  6.  Wash thoroughly, paying special attention to the area where your surgery        will be performed.  7.  Thoroughly rinse your body with warm water from the neck down.  8.  DO NOT shower/wash with your normal soap after using and rinsing off       the CHG Soap.  9.  Pat yourself dry with a clean towel.  10.  Wear clean pajamas.            11.  Place clean sheets on your bed the night of your first shower and do not        sleep with pets.  Day of Surgery  Do not apply any lotions/deodorants the morning of surgery.  Please wear clean clothes to the hospital.   Please read over the following fact sheets that you were given. Pain Booklet, Coughing and Deep Breathing, MRSA Information and Surgical Site Infection Prevention

## 2015-11-05 ENCOUNTER — Encounter (INDEPENDENT_AMBULATORY_CARE_PROVIDER_SITE_OTHER): Payer: Self-pay

## 2015-11-05 ENCOUNTER — Encounter (HOSPITAL_COMMUNITY)
Admission: RE | Admit: 2015-11-05 | Discharge: 2015-11-05 | Disposition: A | Discharge status: Home / Self Care | Payer: Medicare Other | Source: Ambulatory Visit | Attending: Orthopedic Surgery | Admitting: Orthopedic Surgery

## 2015-11-05 ENCOUNTER — Encounter (HOSPITAL_COMMUNITY): Payer: Self-pay

## 2015-11-05 DIAGNOSIS — Z981 Arthrodesis status: Secondary | ICD-10-CM | POA: Diagnosis not present

## 2015-11-05 DIAGNOSIS — Z79899 Other long term (current) drug therapy: Secondary | ICD-10-CM | POA: Insufficient documentation

## 2015-11-05 DIAGNOSIS — Z7982 Long term (current) use of aspirin: Secondary | ICD-10-CM | POA: Diagnosis not present

## 2015-11-05 DIAGNOSIS — I10 Essential (primary) hypertension: Secondary | ICD-10-CM | POA: Insufficient documentation

## 2015-11-05 DIAGNOSIS — Z950 Presence of cardiac pacemaker: Secondary | ICD-10-CM | POA: Diagnosis not present

## 2015-11-05 DIAGNOSIS — M19011 Primary osteoarthritis, right shoulder: Secondary | ICD-10-CM | POA: Insufficient documentation

## 2015-11-05 DIAGNOSIS — I4891 Unspecified atrial fibrillation: Secondary | ICD-10-CM | POA: Diagnosis not present

## 2015-11-05 DIAGNOSIS — Z01818 Encounter for other preprocedural examination: Secondary | ICD-10-CM | POA: Diagnosis not present

## 2015-11-05 DIAGNOSIS — Z01812 Encounter for preprocedural laboratory examination: Secondary | ICD-10-CM | POA: Diagnosis not present

## 2015-11-05 HISTORY — DX: Personal history of other diseases of the digestive system: Z87.19

## 2015-11-05 HISTORY — DX: Personal history of peptic ulcer disease: Z87.11

## 2015-11-05 LAB — CBC
HCT: 35.9 % — ABNORMAL LOW (ref 36.0–46.0)
Hemoglobin: 11.1 g/dL — ABNORMAL LOW (ref 12.0–15.0)
MCH: 31.9 pg (ref 26.0–34.0)
MCHC: 30.9 g/dL (ref 30.0–36.0)
MCV: 103.2 fL — ABNORMAL HIGH (ref 78.0–100.0)
PLATELETS: 268 10*3/uL (ref 150–400)
RBC: 3.48 MIL/uL — AB (ref 3.87–5.11)
RDW: 14.4 % (ref 11.5–15.5)
WBC: 4.4 10*3/uL (ref 4.0–10.5)

## 2015-11-05 LAB — BASIC METABOLIC PANEL
Anion gap: 8 (ref 5–15)
BUN: 21 mg/dL — ABNORMAL HIGH (ref 6–20)
CALCIUM: 9.1 mg/dL (ref 8.9–10.3)
CO2: 25 mmol/L (ref 22–32)
CREATININE: 1.31 mg/dL — AB (ref 0.44–1.00)
Chloride: 108 mmol/L (ref 101–111)
GFR calc non Af Amer: 37 mL/min — ABNORMAL LOW (ref >60)
GFR, EST AFRICAN AMERICAN: 43 mL/min — AB (ref >60)
Glucose, Bld: 105 mg/dL — ABNORMAL HIGH (ref 65–99)
Potassium: 4 mmol/L (ref 3.5–5.1)
SODIUM: 141 mmol/L (ref 135–145)

## 2015-11-05 LAB — SURGICAL PCR SCREEN
MRSA, PCR: NEGATIVE
STAPHYLOCOCCUS AUREUS: NEGATIVE

## 2015-11-05 NOTE — Progress Notes (Signed)
Pt has hx of A-fib and has a pacemaker. Pt has a cardiac clearance from Dr. Agustin Cree in [place]. Have requested EKG and Stress test done in July to be faxed to [place]. Pt denies any chest pain or sob.

## 2015-11-08 NOTE — Progress Notes (Addendum)
Anesthesia Chart Review:  Pt is an 82 year old female scheduled for R total shoulder arthroplasty on 11/16/2015 with Marchia Bond, MD.   Cardiologist is Jenne Campus, MD (see notes in care everywhere) who has cleared pt for surgery.   PMH includes:  Atrial fibrillation, HTN, pacemaker ([employer]). Never smoker. BMI 28.5. S/p lumbar fusion 04/07/11.   Medications include: ASA, flecainide, hctz, prilosec.   Preoperative labs reviewed.    EKG 07/21/15: Paced rhythm. First degree AV block - occasional ectopic ventricular beat. RAE. Nonspecific T wave abnormality.   Nuclear stress test 10/14/15: stress nuclear study is normal. EF 49%  Perioperative prescription form for pacemaker is pending.   If no changes, I anticipate pt can proceed with surgery as scheduled.   Willeen Cass, FNP-BC [facility]/Anesthesiology Phone: [phone] 11/15/2015 1:09 PM

## 2015-11-16 ENCOUNTER — Inpatient Hospital Stay (HOSPITAL_COMMUNITY)
Admission: RE | Admit: 2015-11-16 | Discharge: 2015-11-18 | DRG: 464 | Disposition: A | Discharge status: Home / Self Care | Payer: Medicare Other | Source: Ambulatory Visit | Attending: Orthopedic Surgery | Admitting: Orthopedic Surgery

## 2015-11-16 ENCOUNTER — Inpatient Hospital Stay (HOSPITAL_COMMUNITY): Payer: Medicare Other | Admitting: Certified Registered Nurse Anesthetist

## 2015-11-16 ENCOUNTER — Inpatient Hospital Stay (HOSPITAL_COMMUNITY): Payer: Medicare Other

## 2015-11-16 ENCOUNTER — Encounter (HOSPITAL_COMMUNITY)
Admission: RE | Disposition: A | Discharge status: Home / Self Care | Payer: Self-pay | Source: Ambulatory Visit | Attending: Orthopedic Surgery

## 2015-11-16 ENCOUNTER — Inpatient Hospital Stay (HOSPITAL_COMMUNITY): Payer: Medicare Other | Admitting: Emergency Medicine

## 2015-11-16 ENCOUNTER — Encounter (HOSPITAL_COMMUNITY): Payer: Self-pay | Admitting: *Deleted

## 2015-11-16 DIAGNOSIS — Z9842 Cataract extraction status, left eye: Secondary | ICD-10-CM

## 2015-11-16 DIAGNOSIS — Z96651 Presence of right artificial knee joint: Secondary | ICD-10-CM | POA: Diagnosis not present

## 2015-11-16 DIAGNOSIS — Z96619 Presence of unspecified artificial shoulder joint: Secondary | ICD-10-CM

## 2015-11-16 DIAGNOSIS — Z880 Allergy status to penicillin: Secondary | ICD-10-CM

## 2015-11-16 DIAGNOSIS — Z79899 Other long term (current) drug therapy: Secondary | ICD-10-CM | POA: Diagnosis not present

## 2015-11-16 DIAGNOSIS — Z791 Long term (current) use of non-steroidal anti-inflammatories (NSAID): Secondary | ICD-10-CM

## 2015-11-16 DIAGNOSIS — Z7982 Long term (current) use of aspirin: Secondary | ICD-10-CM

## 2015-11-16 DIAGNOSIS — D1721 Benign lipomatous neoplasm of skin and subcutaneous tissue of right arm: Secondary | ICD-10-CM | POA: Diagnosis present

## 2015-11-16 DIAGNOSIS — Z9841 Cataract extraction status, right eye: Secondary | ICD-10-CM

## 2015-11-16 DIAGNOSIS — Z7983 Long term (current) use of bisphosphonates: Secondary | ICD-10-CM | POA: Diagnosis not present

## 2015-11-16 DIAGNOSIS — Z471 Aftercare following joint replacement surgery: Secondary | ICD-10-CM | POA: Diagnosis not present

## 2015-11-16 DIAGNOSIS — M12811 Other specific arthropathies, not elsewhere classified, right shoulder: Secondary | ICD-10-CM | POA: Insufficient documentation

## 2015-11-16 DIAGNOSIS — M7989 Other specified soft tissue disorders: Secondary | ICD-10-CM | POA: Diagnosis not present

## 2015-11-16 DIAGNOSIS — Z961 Presence of intraocular lens: Secondary | ICD-10-CM | POA: Diagnosis present

## 2015-11-16 DIAGNOSIS — M19011 Primary osteoarthritis, right shoulder: Principal | ICD-10-CM | POA: Diagnosis present

## 2015-11-16 DIAGNOSIS — D62 Acute posthemorrhagic anemia: Secondary | ICD-10-CM | POA: Diagnosis not present

## 2015-11-16 DIAGNOSIS — Z96611 Presence of right artificial shoulder joint: Secondary | ICD-10-CM | POA: Diagnosis not present

## 2015-11-16 DIAGNOSIS — D1730 Benign lipomatous neoplasm of skin and subcutaneous tissue of unspecified sites: Secondary | ICD-10-CM | POA: Diagnosis not present

## 2015-11-16 DIAGNOSIS — I10 Essential (primary) hypertension: Secondary | ICD-10-CM | POA: Diagnosis present

## 2015-11-16 DIAGNOSIS — M75101 Unspecified rotator cuff tear or rupture of right shoulder, not specified as traumatic: Secondary | ICD-10-CM | POA: Insufficient documentation

## 2015-11-16 DIAGNOSIS — Z90710 Acquired absence of both cervix and uterus: Secondary | ICD-10-CM

## 2015-11-16 DIAGNOSIS — G8918 Other acute postprocedural pain: Secondary | ICD-10-CM | POA: Diagnosis not present

## 2015-11-16 DIAGNOSIS — Z888 Allergy status to other drugs, medicaments and biological substances status: Secondary | ICD-10-CM

## 2015-11-16 HISTORY — PX: REVERSE SHOULDER ARTHROPLASTY: SHX5054

## 2015-11-16 HISTORY — DX: Other specific arthropathies, not elsewhere classified, right shoulder: M12.811

## 2015-11-16 HISTORY — DX: Unspecified rotator cuff tear or rupture of right shoulder, not specified as traumatic: M75.101

## 2015-11-16 HISTORY — DX: Presence of unspecified artificial shoulder joint: Z96.619

## 2015-11-16 SURGERY — ARTHROPLASTY, SHOULDER, TOTAL, REVERSE
Anesthesia: General | Site: Shoulder | Laterality: Right

## 2015-11-16 MED ORDER — ROCURONIUM BROMIDE 10 MG/ML (PF) SYRINGE
PREFILLED_SYRINGE | INTRAVENOUS | Status: AC
  Filled 2015-11-16: qty 10

## 2015-11-16 MED ORDER — METOPROLOL TARTRATE 5 MG/5ML IV SOLN
INTRAVENOUS | Status: DC | PRN
  Administered 2015-11-16 (×4): 2.5 mg via INTRAVENOUS

## 2015-11-16 MED ORDER — ADULT MULTIVITAMIN W/MINERALS CH
1.0000 | ORAL_TABLET | Freq: Every day | ORAL | Status: DC
  Administered 2015-11-16 – 2015-11-18 (×3): 1 via ORAL
  Filled 2015-11-16 (×3): qty 1

## 2015-11-16 MED ORDER — SENNA-DOCUSATE SODIUM 8.6-50 MG PO TABS: 2.0000 | ORAL_TABLET | Freq: Every day | ORAL | 1 refills | Status: DC

## 2015-11-16 MED ORDER — LIDOCAINE 2% (20 MG/ML) 5 ML SYRINGE
INTRAMUSCULAR | Status: AC
  Filled 2015-11-16: qty 5

## 2015-11-16 MED ORDER — FENTANYL CITRATE (PF) 100 MCG/2ML IJ SOLN
INTRAMUSCULAR | Status: AC
  Filled 2015-11-16: qty 2

## 2015-11-16 MED ORDER — ONDANSETRON HCL 4 MG PO TABS: 4.0000 mg | ORAL_TABLET | Freq: Three times a day (TID) | ORAL | 0 refills | Status: DC | PRN

## 2015-11-16 MED ORDER — ONDANSETRON HCL 4 MG/2ML IJ SOLN: 4.0000 mg | Freq: Four times a day (QID) | INTRAMUSCULAR | Status: DC | PRN

## 2015-11-16 MED ORDER — GLYCOPYRROLATE 0.2 MG/ML IJ SOLN
INTRAMUSCULAR | Status: DC | PRN
  Administered 2015-11-16: .1 mg via INTRAVENOUS

## 2015-11-16 MED ORDER — METOCLOPRAMIDE HCL 5 MG PO TABS: 5.0000 mg | ORAL_TABLET | Freq: Three times a day (TID) | ORAL | Status: DC | PRN

## 2015-11-16 MED ORDER — ASPIRIN EC 81 MG PO TBEC
81.0000 mg | DELAYED_RELEASE_TABLET | Freq: Every day | ORAL | Status: DC
  Administered 2015-11-16 – 2015-11-17 (×2): 81 mg via ORAL
  Filled 2015-11-16 (×2): qty 1

## 2015-11-16 MED ORDER — PHENOL 1.4 % MT LIQD
1.0000 | OROMUCOSAL | Status: DC | PRN
Start: 2015-11-16 — End: 2015-11-18

## 2015-11-16 MED ORDER — ROCURONIUM BROMIDE 100 MG/10ML IV SOLN
INTRAVENOUS | Status: DC | PRN
  Administered 2015-11-16: 40 mg via INTRAVENOUS

## 2015-11-16 MED ORDER — MENTHOL 3 MG MT LOZG
1.0000 | LOZENGE | OROMUCOSAL | Status: DC | PRN
Start: 2015-11-16 — End: 2015-11-18

## 2015-11-16 MED ORDER — VITAMIN B-12 1000 MCG PO TABS
2000.0000 ug | ORAL_TABLET | Freq: Every day | ORAL | Status: DC
  Administered 2015-11-16 – 2015-11-18 (×3): 2000 ug via ORAL
  Filled 2015-11-16 (×3): qty 2

## 2015-11-16 MED ORDER — FENTANYL CITRATE (PF) 100 MCG/2ML IJ SOLN
INTRAMUSCULAR | Status: DC | PRN
  Administered 2015-11-16: 75 ug via INTRAVENOUS
  Administered 2015-11-16: 25 ug via INTRAVENOUS

## 2015-11-16 MED ORDER — OXYCODONE HCL 5 MG PO TABS
5.0000 mg | ORAL_TABLET | ORAL | Status: DC | PRN
  Administered 2015-11-16 (×2): 10 mg via ORAL
  Administered 2015-11-16: 5 mg via ORAL
  Administered 2015-11-17 – 2015-11-18 (×10): 10 mg via ORAL
  Filled 2015-11-16: qty 1
  Filled 2015-11-16 (×12): qty 2

## 2015-11-16 MED ORDER — MIDAZOLAM HCL 2 MG/2ML IJ SOLN
INTRAMUSCULAR | Status: AC
Start: 2015-11-16 — End: 2015-11-16
  Administered 2015-11-16: 1 mg
  Filled 2015-11-16: qty 2

## 2015-11-16 MED ORDER — GLYCOPYRROLATE 0.2 MG/ML IV SOSY
PREFILLED_SYRINGE | INTRAVENOUS | Status: AC
  Filled 2015-11-16: qty 3

## 2015-11-16 MED ORDER — METOCLOPRAMIDE HCL 5 MG/ML IJ SOLN
5.0000 mg | Freq: Three times a day (TID) | INTRAMUSCULAR | Status: DC | PRN
Start: 2015-11-16 — End: 2015-11-18

## 2015-11-16 MED ORDER — ONDANSETRON HCL 4 MG/2ML IJ SOLN
INTRAMUSCULAR | Status: AC
  Filled 2015-11-16: qty 2

## 2015-11-16 MED ORDER — LACTATED RINGERS IV SOLN
INTRAVENOUS | Status: DC
  Administered 2015-11-16 (×2): via INTRAVENOUS

## 2015-11-16 MED ORDER — HYDROMORPHONE HCL 1 MG/ML IJ SOLN: 0.2500 mg | INTRAMUSCULAR | Status: DC | PRN

## 2015-11-16 MED ORDER — ONDANSETRON HCL 4 MG/2ML IJ SOLN: 4.0000 mg | Freq: Once | INTRAMUSCULAR | Status: DC | PRN

## 2015-11-16 MED ORDER — ONDANSETRON HCL 4 MG/2ML IJ SOLN
INTRAMUSCULAR | Status: DC | PRN
  Administered 2015-11-16: 4 mg via INTRAVENOUS

## 2015-11-16 MED ORDER — FLECAINIDE ACETATE 100 MG PO TABS
100.0000 mg | ORAL_TABLET | Freq: Every evening | ORAL | Status: DC
  Administered 2015-11-16 – 2015-11-17 (×2): 100 mg via ORAL
  Filled 2015-11-16 (×2): qty 1

## 2015-11-16 MED ORDER — BACLOFEN 10 MG PO TABS: 10.0000 mg | ORAL_TABLET | Freq: Three times a day (TID) | ORAL | 0 refills | Status: DC

## 2015-11-16 MED ORDER — ALPRAZOLAM 0.25 MG PO TABS: 0.2500 mg | ORAL_TABLET | Freq: Every evening | ORAL | Status: DC | PRN

## 2015-11-16 MED ORDER — DIPHENHYDRAMINE HCL 12.5 MG/5ML PO ELIX: 12.5000 mg | ORAL_SOLUTION | ORAL | Status: DC | PRN

## 2015-11-16 MED ORDER — METHOCARBAMOL 1000 MG/10ML IJ SOLN: 500.0000 mg | Freq: Four times a day (QID) | INTRAVENOUS | Status: DC | PRN

## 2015-11-16 MED ORDER — ONDANSETRON HCL 4 MG PO TABS: 4.0000 mg | ORAL_TABLET | Freq: Four times a day (QID) | ORAL | Status: DC | PRN

## 2015-11-16 MED ORDER — HYDROCHLOROTHIAZIDE 25 MG PO TABS
25.0000 mg | ORAL_TABLET | Freq: Every day | ORAL | Status: DC
  Administered 2015-11-17 – 2015-11-18 (×2): 25 mg via ORAL
  Filled 2015-11-16 (×2): qty 1

## 2015-11-16 MED ORDER — ACETAMINOPHEN 650 MG RE SUPP
650.0000 mg | Freq: Four times a day (QID) | RECTAL | Status: DC | PRN
Start: 2015-11-16 — End: 2015-11-18

## 2015-11-16 MED ORDER — CEFAZOLIN SODIUM-DEXTROSE 2-4 GM/100ML-% IV SOLN
2.0000 g | INTRAVENOUS | Status: AC
  Administered 2015-11-16: 2 g via INTRAVENOUS

## 2015-11-16 MED ORDER — SUGAMMADEX SODIUM 200 MG/2ML IV SOLN
INTRAVENOUS | Status: DC | PRN
  Administered 2015-11-16: 200 mg via INTRAVENOUS

## 2015-11-16 MED ORDER — COENZYME Q10 30 MG PO CAPS: 30.0000 mg | ORAL_CAPSULE | Freq: Every day | ORAL | Status: DC

## 2015-11-16 MED ORDER — ARTIFICIAL TEARS OP OINT
TOPICAL_OINTMENT | OPHTHALMIC | Status: AC
  Filled 2015-11-16: qty 3.5

## 2015-11-16 MED ORDER — POLYETHYLENE GLYCOL 3350 17 G PO PACK: 17.0000 g | PACK | Freq: Every day | ORAL | Status: DC | PRN

## 2015-11-16 MED ORDER — VITAMIN D 1000 UNITS PO TABS
1000.0000 [IU] | ORAL_TABLET | Freq: Every day | ORAL | Status: DC
  Administered 2015-11-16 – 2015-11-18 (×3): 1000 [IU] via ORAL
  Filled 2015-11-16 (×3): qty 1

## 2015-11-16 MED ORDER — KELP 100 MG PO TABS: 100.0000 mg | ORAL_TABLET | Freq: Every day | ORAL | Status: DC

## 2015-11-16 MED ORDER — MAGNESIUM CITRATE PO SOLN: 1.0000 | Freq: Once | ORAL | Status: DC | PRN

## 2015-11-16 MED ORDER — LIDOCAINE HCL (CARDIAC) 20 MG/ML IV SOLN
INTRAVENOUS | Status: DC | PRN
  Administered 2015-11-16: 40 mg via INTRATRACHEAL

## 2015-11-16 MED ORDER — SUCCINYLCHOLINE CHLORIDE 200 MG/10ML IV SOSY
PREFILLED_SYRINGE | INTRAVENOUS | Status: AC
  Filled 2015-11-16: qty 10

## 2015-11-16 MED ORDER — PROPOFOL 10 MG/ML IV BOLUS
INTRAVENOUS | Status: AC
  Filled 2015-11-16: qty 20

## 2015-11-16 MED ORDER — MEPERIDINE HCL 25 MG/ML IJ SOLN: 6.2500 mg | INTRAMUSCULAR | Status: DC | PRN

## 2015-11-16 MED ORDER — SENNA 8.6 MG PO TABS
1.0000 | ORAL_TABLET | Freq: Two times a day (BID) | ORAL | Status: DC
  Administered 2015-11-16 – 2015-11-18 (×4): 8.6 mg via ORAL
  Filled 2015-11-16 (×5): qty 1

## 2015-11-16 MED ORDER — PANTOPRAZOLE SODIUM 40 MG PO TBEC
80.0000 mg | DELAYED_RELEASE_TABLET | Freq: Every day | ORAL | Status: DC
  Administered 2015-11-17 – 2015-11-18 (×2): 80 mg via ORAL
  Filled 2015-11-16 (×2): qty 2

## 2015-11-16 MED ORDER — DEXTROSE 5 % IV SOLN
INTRAVENOUS | Status: DC | PRN
  Administered 2015-11-16: 25 ug/min via INTRAVENOUS

## 2015-11-16 MED ORDER — FENTANYL CITRATE (PF) 100 MCG/2ML IJ SOLN
100.0000 ug | Freq: Once | INTRAMUSCULAR | Status: AC
  Administered 2015-11-16: 50 ug via INTRAVENOUS

## 2015-11-16 MED ORDER — 0.9 % SODIUM CHLORIDE (POUR BTL) OPTIME
TOPICAL | Status: DC | PRN
  Administered 2015-11-16: 1000 mL

## 2015-11-16 MED ORDER — FLECAINIDE ACETATE 50 MG PO TABS
50.0000 mg | ORAL_TABLET | Freq: Every day | ORAL | Status: DC
  Administered 2015-11-17 – 2015-11-18 (×2): 50 mg via ORAL
  Filled 2015-11-16 (×2): qty 1

## 2015-11-16 MED ORDER — BISACODYL 10 MG RE SUPP: 10.0000 mg | Freq: Every day | RECTAL | Status: DC | PRN

## 2015-11-16 MED ORDER — ACETAMINOPHEN 325 MG PO TABS
650.0000 mg | ORAL_TABLET | Freq: Four times a day (QID) | ORAL | Status: DC | PRN
  Administered 2015-11-17 – 2015-11-18 (×2): 650 mg via ORAL
  Filled 2015-11-16 (×2): qty 2

## 2015-11-16 MED ORDER — IRBESARTAN 300 MG PO TABS
300.0000 mg | ORAL_TABLET | Freq: Every day | ORAL | Status: DC
  Administered 2015-11-17 – 2015-11-18 (×2): 300 mg via ORAL
  Filled 2015-11-16 (×2): qty 1

## 2015-11-16 MED ORDER — METOPROLOL TARTRATE 5 MG/5ML IV SOLN
INTRAVENOUS | Status: AC
  Filled 2015-11-16: qty 5

## 2015-11-16 MED ORDER — OXYCODONE-ACETAMINOPHEN 10-325 MG PO TABS: 1.0000 | ORAL_TABLET | ORAL | 0 refills | Status: DC | PRN

## 2015-11-16 MED ORDER — BUPIVACAINE-EPINEPHRINE (PF) 0.5% -1:200000 IJ SOLN
INTRAMUSCULAR | Status: DC | PRN
  Administered 2015-11-16: 25 mL via PERINEURAL

## 2015-11-16 MED ORDER — METHOCARBAMOL 500 MG PO TABS
500.0000 mg | ORAL_TABLET | Freq: Four times a day (QID) | ORAL | Status: DC | PRN
  Administered 2015-11-17 – 2015-11-18 (×5): 500 mg via ORAL
  Filled 2015-11-16 (×5): qty 1

## 2015-11-16 MED ORDER — CEFAZOLIN SODIUM-DEXTROSE 2-4 GM/100ML-% IV SOLN
2.0000 g | Freq: Two times a day (BID) | INTRAVENOUS | Status: AC
  Administered 2015-11-16 – 2015-11-17 (×2): 2 g via INTRAVENOUS
  Filled 2015-11-16 (×2): qty 100

## 2015-11-16 MED ORDER — MIDAZOLAM HCL 5 MG/ML IJ SOLN: 2.0000 mg | Freq: Once | INTRAMUSCULAR | Status: DC

## 2015-11-16 MED ORDER — ALUM & MAG HYDROXIDE-SIMETH 200-200-20 MG/5ML PO SUSP: 30.0000 mL | ORAL | Status: DC | PRN

## 2015-11-16 MED ORDER — DOCUSATE SODIUM 100 MG PO CAPS
100.0000 mg | ORAL_CAPSULE | Freq: Two times a day (BID) | ORAL | Status: DC
  Administered 2015-11-16 – 2015-11-18 (×4): 100 mg via ORAL
  Filled 2015-11-16 (×5): qty 1

## 2015-11-16 MED ORDER — FLAXSEED OIL 1000 MG PO CAPS: 1000.0000 mg | ORAL_CAPSULE | Freq: Every day | ORAL | Status: DC

## 2015-11-16 MED ORDER — PROPOFOL 10 MG/ML IV BOLUS
INTRAVENOUS | Status: DC | PRN
  Administered 2015-11-16: 100 mg via INTRAVENOUS
  Administered 2015-11-16: 30 mg via INTRAVENOUS

## 2015-11-16 MED ORDER — POTASSIUM CHLORIDE IN NACL 20-0.45 MEQ/L-% IV SOLN
INTRAVENOUS | Status: DC
  Administered 2015-11-16: 17:00:00 via INTRAVENOUS
  Filled 2015-11-16 (×2): qty 1000

## 2015-11-16 MED ORDER — HYDROMORPHONE HCL 1 MG/ML IJ SOLN: 0.5000 mg | INTRAMUSCULAR | Status: DC | PRN

## 2015-11-16 SURGICAL SUPPLY — 73 items
BIT DRILL 5/64X5 DISP (BIT) ×2 IMPLANT
BIT DRILL F/CENTRAL SCRW 3.2 (BIT) ×1
BIT DRILL F/CENTRAL SCRW 3.2MM (BIT) IMPLANT
BIT DRILL TWIST 2.7 (BIT) ×1 IMPLANT
BLADE SAW SGTL MED 73X18.5 STR (BLADE) ×2 IMPLANT
BOWL SMART MIX CTS (DISPOSABLE) ×1 IMPLANT
CAPT SHLDR REVTOTAL 2 ×1 IMPLANT
CEMENT BONE DEPUY (Cement) ×2 IMPLANT
CLSR STERI-STRIP ANTIMIC 1/2X4 (GAUZE/BANDAGES/DRESSINGS) ×2 IMPLANT
COVER SURGICAL LIGHT HANDLE (MISCELLANEOUS) ×2 IMPLANT
DRAPE ORTHO SPLIT 77X108 STRL (DRAPES) ×4
DRAPE PROXIMA HALF (DRAPES) ×2 IMPLANT
DRAPE SURG ORHT 6 SPLT 77X108 (DRAPES) ×2 IMPLANT
DRAPE U-SHAPE 47X51 STRL (DRAPES) ×2 IMPLANT
DRILL BIT F/CENTRAL SCRW 3.2MM (BIT) ×2
DRSG MEPILEX BORDER 4X8 (GAUZE/BANDAGES/DRESSINGS) ×2 IMPLANT
DURAPREP 26ML APPLICATOR (WOUND CARE) ×2 IMPLANT
ELECT REM PT RETURN 9FT ADLT (ELECTROSURGICAL) ×2
ELECTRODE REM PT RTRN 9FT ADLT (ELECTROSURGICAL) ×1 IMPLANT
EVACUATOR 1/8 PVC DRAIN (DRAIN) IMPLANT
GLOVE BIOGEL PI IND STRL 6.5 (GLOVE) IMPLANT
GLOVE BIOGEL PI IND STRL 8 (GLOVE) ×1 IMPLANT
GLOVE BIOGEL PI INDICATOR 6.5 (GLOVE) ×2
GLOVE BIOGEL PI INDICATOR 8 (GLOVE) ×1
GLOVE BIOGEL PI ORTHO PRO 7.5 (GLOVE) ×1
GLOVE BIOGEL PI ORTHO PRO SZ8 (GLOVE) ×1
GLOVE ORTHO TXT STRL SZ7.5 (GLOVE) ×2 IMPLANT
GLOVE PI ORTHO PRO STRL 7.5 (GLOVE) IMPLANT
GLOVE PI ORTHO PRO STRL SZ8 (GLOVE) ×1 IMPLANT
GLOVE SURG ORTHO 8.0 STRL STRW (GLOVE) ×4 IMPLANT
GLOVE SURG SS PI 7.0 STRL IVOR (GLOVE) ×1 IMPLANT
GOWN STRL REUS W/ TWL LRG LVL3 (GOWN DISPOSABLE) ×1 IMPLANT
GOWN STRL REUS W/ TWL XL LVL3 (GOWN DISPOSABLE) ×1 IMPLANT
GOWN STRL REUS W/TWL 2XL LVL3 (GOWN DISPOSABLE) ×2 IMPLANT
GOWN STRL REUS W/TWL LRG LVL3 (GOWN DISPOSABLE) ×2
GOWN STRL REUS W/TWL XL LVL3 (GOWN DISPOSABLE) ×2
HANDPIECE INTERPULSE COAX TIP (DISPOSABLE) ×2
HOOD PEEL AWAY FACE SHEILD DIS (HOOD) ×4 IMPLANT
KIT BASIN OR (CUSTOM PROCEDURE TRAY) ×2 IMPLANT
KIT ROOM TURNOVER OR (KITS) ×2 IMPLANT
MANIFOLD NEPTUNE II (INSTRUMENTS) ×2 IMPLANT
NDL 1/2 CIR CATGUT .05X1.09 (NEEDLE) IMPLANT
NDL HYPO 25GX1X1/2 BEV (NEEDLE) IMPLANT
NEEDLE 1/2 CIR CATGUT .05X1.09 (NEEDLE) IMPLANT
NEEDLE HYPO 25GX1X1/2 BEV (NEEDLE) IMPLANT
NS IRRIG 1000ML POUR BTL (IV SOLUTION) ×2 IMPLANT
PACK SHOULDER (CUSTOM PROCEDURE TRAY) ×2 IMPLANT
PAD ARMBOARD 7.5X6 YLW CONV (MISCELLANEOUS) ×4 IMPLANT
PIN STEINMANN THREADED TIP (PIN) ×1 IMPLANT
PIN THREADED REVERSE (PIN) ×1 IMPLANT
SET HNDPC FAN SPRY TIP SCT (DISPOSABLE) ×1 IMPLANT
SLEEVE SURGEON STRL (DRAPES) ×1 IMPLANT
SLING ARM IMMOBILIZER LRG (SOFTGOODS) ×1 IMPLANT
SLING ARM IMMOBILIZER MED (SOFTGOODS) ×1 IMPLANT
SPECIMEN JAR MEDIUM (MISCELLANEOUS) ×1 IMPLANT
SPONGE LAP 18X18 X RAY DECT (DISPOSABLE) ×2 IMPLANT
SUCTION FRAZIER HANDLE 10FR (MISCELLANEOUS) ×1
SUCTION TUBE FRAZIER 10FR DISP (MISCELLANEOUS) ×1 IMPLANT
SUPPORT WRAP ARM LG (MISCELLANEOUS) ×2 IMPLANT
SUT FIBERWIRE #2 38 REV NDL BL (SUTURE) ×4
SUT MNCRL AB 4-0 PS2 18 (SUTURE) IMPLANT
SUT VIC AB 0 CT1 27 (SUTURE) ×2
SUT VIC AB 0 CT1 27XBRD ANBCTR (SUTURE) ×1 IMPLANT
SUT VIC AB 2-0 CT1 27 (SUTURE) ×2
SUT VIC AB 2-0 CT1 TAPERPNT 27 (SUTURE) IMPLANT
SUT VIC AB 3-0 SH 8-18 (SUTURE) ×2 IMPLANT
SUTURE FIBERWR#2 38 REV NDL BL (SUTURE) IMPLANT
SYR CONTROL 10ML LL (SYRINGE) IMPLANT
TOWEL OR 17X24 6PK STRL BLUE (TOWEL DISPOSABLE) ×2 IMPLANT
TOWEL OR 17X26 10 PK STRL BLUE (TOWEL DISPOSABLE) ×2 IMPLANT
TUBE CONNECTING 12X1/4 (SUCTIONS) ×2 IMPLANT
WATER STERILE IRR 1000ML POUR (IV SOLUTION) ×2 IMPLANT
YANKAUER SUCT BULB TIP NO VENT (SUCTIONS) ×2 IMPLANT

## 2015-11-16 NOTE — Discharge Instructions (Signed)

## 2015-11-16 NOTE — Transfer of Care (Signed)
Immediate Anesthesia Transfer of Care Note  Patient: [name]  Procedure(s) Performed: Procedure(s): REVERSE SHOULDER ARTHROPLASTY (Right)  Patient Location: PACU  Anesthesia Type:General  Level of Consciousness: awake, alert , oriented and patient cooperative  Airway & Oxygen Therapy: Patient Spontanous Breathing and Patient connected to nasal cannula oxygen  Post-op Assessment: Report given to RN, Post -op Vital signs reviewed and stable, Patient moving all extremities X 4 and Patient able to stick tongue midline  Post vital signs: Reviewed and stable  Last Vitals:  Vitals:   11/16/15 0924 11/16/15 1351  BP: (!) 177/83   Pulse: 92 (P) 85  Resp: 18 (P) 15  Temp: 36.8 C (P) 36.1 C    Last Pain:  Vitals:   11/16/15 0924  TempSrc: Oral      Patients Stated Pain Goal: 2 ([id number])  Complications: No apparent anesthesia complications

## 2015-11-16 NOTE — Progress Notes (Signed)
Orthopedic Tech Progress Note Patient Details:  [name] 1931-06-16 [id number] Patient already has arm sling. Patient ID: [name], female   DOB: [birth year], 82 y.o.   MRN: [id number]   [name] 11/16/2015, 4:13 PM

## 2015-11-16 NOTE — Op Note (Signed)
11/16/2015  1:34 PM  PATIENT:  [name]    PRE-OPERATIVE DIAGNOSIS:  Right rotator cuff arthropathy and subcutaneous lipoma, 4 x 4 centimeters  POST-OPERATIVE DIAGNOSIS:  Same  PROCEDURE:  REVERSE SHOULDER ARTHROPLASTY with excision of subcutaneous lipoma  SURGEON:  Johnny Bridge, MD  PHYSICIAN ASSISTANT: Joya Gaskins, OPA-C, present and scrubbed throughout the case, critical for completion in a timely fashion, and for retraction, instrumentation, and closure.  ANESTHESIA:   General  Estimated blood loss: 200 mL. She actually bled a moderate amount from the undersurface of the deltoid throughout the case. There was no discrete bleeding, just general oozing.  PREOPERATIVE INDICATIONS:  [name] is a  82 y.o. female with a diagnosis of right rotator cuff arthropathy with a history of previous cuff repair who failed conservative measures and elected for surgical management.  She also had a large lipoma that had been worked up at [facility], and found to be benign, and she elected for surgical removal because it was basically within our surgical field. She indicated this to me at the last moment just before we went back to the operating room.  The risks benefits and alternatives were discussed with the patient preoperatively including but not limited to the risks of infection, bleeding, nerve injury, cardiopulmonary complications, the need for revision surgery, dislocation, brachial plexus palsy, incomplete relief of pain, among others, and the patient was willing to proceed. We also discussed the potential for recurrence of the lipoma, and incomplete excision.  OPERATIVE IMPLANTS: Biomet size 9 humeral stem press-fit standard, although I ended up having to cement the stem because it did not have adequate press-fit, with a 44 mm reverse shoulder arthroplasty tray with a standard liner and a 36 mm glenosphere with a mini baseplate and 3 locking screws and one central  nonlocking screw.  OPERATIVE FINDINGS: The lipoma was not really well circumscribed, although did not appear to be invading the fascia, but blended with the standard adipose tissue. Nonetheless I shelled this out and removed it. The shoulder was fairly hypermobile, tissue quality was overall fairly poor, bone quality was also poor. The humeral stem at the shaft had fairly substantial bite with a 9 reamer, but the metaphyseal bone was really not adequate for press-fit fixation. I did place the 9 stem with a press-fit, and the trial did hold, although the real stem did not give adequate hold. Therefore I converted to a cemented implant. The glenoid face had minimal wear, mostly superiorly. The humeral head and infraspinatus was completely bald, the subscapularis had fairly extreme attenuation, and was poor quality, but I was able to repair back to bone with a soft tissue sleeve. The biceps tendon was extremely poor quality, and appeared to have likely been dislocating across the lesser tuberosity.  OPERATIVE PROCEDURE: The patient was brought to the operating room and placed in the supine position. General anesthesia was administered. IV antibiotics were given. Time out was performed. The upper extremity was prepped and draped in usual sterile fashion. The patient was in a beachchair position. Deltopectoral approach was carried out.   First I moved subcutaneously around the lipoma laterally, I made a slightly larger than typical incision for the total shoulder replacement in order to gain access to the inferior portion of the lipoma. I excised the adipose tissue in this location, and this was sent to pathology.  A deltopectoral approach was then carried out.  The biceps was tenodesed to the pectoralis tendon with #2 Fiberwire. The  subscapularis was released off of the bone.   I then performed circumferential releases of the humerus, and then dislocated the head, and then reamed with the reamer to a size 8,  this actually did provide some degree of interference.  I then applied the jig, and cut the humeral head in 30 of retroversion, and then turned my attention to the glenoid.  Deep retractors were placed, and I resected the labrum, and then placed a guidepin into the center position on the glenoid, with slight inferior inclination. I then reamed over the guidepin, and this created a small metaphyseal cancellus blush inferiorly, removing just the cartilage to the subchondral bone superiorly. The base plate was selected and impacted place, and then I secured it centrally with a nonlocking screw, although the central screw did not have an amazing bite, it was adequate, likely secondary to her age and bone quality, and I had excellent purchase both inferiorly and superiorly. I placed a short locking screws on anterior aspects.  I then turned my attention to the glenosphere, and impacted this into place, placing slight inferior offset (set on B).   The glenosphere was completely seated, and had engagement of the [facility] taper. I then turned my attention back to the humerus.  I sequentially broached, and then trialed, and was found to restore soft tissue tension, and it had 2 finger tightness. I actually felt that the 8 was not a good hold, and so I went back and I broached the diaphysis with a 9 and then placed a 9 trial, which did have good hold with the trial, therefore the above named components were selected. The shoulder felt stable throughout functional motion.  Before I placed the real prosthesis I had also placed a total of 3 #2 FiberWire through drill holes in the humerus for later subscapularis repair.  I then impacted the real prosthesis into place, but the real prosthesis did not have adequate press-fit rotational stability, and so I felt that I was probably tight in the canal but loose in the metaphysis, and so I converted to a cemented as technique. After the cement had cured, I impacted the real  humeral tray, and reduced the shoulder. The shoulder had excellent motion, and was stable, and I irrigated the wounds copiously.    I then used these to repair the subscapularis. This came down to bone.  I then irrigated the shoulder copiously once more, repaired the deltopectoral interval with Vicryl followed by subcutaneous Vicryl with Steri-Strips and sterile gauze for the skin. The patient was awakened and returned back in stable and satisfactory condition. There no complications and She tolerated the procedure well.

## 2015-11-16 NOTE — Anesthesia Procedure Notes (Signed)
Procedure Name: Intubation Date/Time: 11/16/2015 11:25 AM Performed by: Oleta Mouse Pre-anesthesia Checklist: Patient identified, Emergency Drugs available, Suction available and Patient being monitored Patient Re-evaluated:Patient Re-evaluated prior to inductionOxygen Delivery Method: Circle system utilized Preoxygenation: Pre-oxygenation with 100% oxygen Intubation Type: IV induction Ventilation: Mask ventilation without difficulty Laryngoscope Size: Mac and 3 Grade View: Grade I Tube type: Oral Tube size: 7.0 mm Number of attempts: 1 Airway Equipment and Method: Stylet Placement Confirmation: ETT inserted through vocal cords under direct vision,  positive ETCO2 and breath sounds checked- equal and bilateral Secured at: 22 cm Tube secured with: Tape Dental Injury: Teeth and Oropharynx as per pre-operative assessment

## 2015-11-16 NOTE — Anesthesia Preprocedure Evaluation (Signed)
Anesthesia Evaluation  Patient identified by MRN, date of birth, ID band Patient awake    Reviewed: Allergy & Precautions, NPO status , Patient's Chart, lab work & pertinent test results  Airway Mallampati: I  TM Distance: >3 FB Neck ROM: Full    Dental   Pulmonary    Pulmonary exam normal        Cardiovascular hypertension, Pt. on medications Normal cardiovascular exam+ pacemaker      Neuro/Psych    GI/Hepatic   Endo/Other    Renal/GU      Musculoskeletal   Abdominal   Peds  Hematology   Anesthesia Other Findings   Reproductive/Obstetrics                             Anesthesia Physical Anesthesia Plan  ASA: III  Anesthesia Plan: General   Post-op Pain Management:  Regional for Post-op pain   Induction: Intravenous  Airway Management Planned: Oral ETT  Additional Equipment:   Intra-op Plan:   Post-operative Plan: Extubation in OR  Informed Consent: I have reviewed the patients History and Physical, chart, labs and discussed the procedure including the risks, benefits and alternatives for the proposed anesthesia with the patient or authorized representative who has indicated his/her understanding and acceptance.     Plan Discussed with: CRNA and Surgeon  Anesthesia Plan Comments:         Anesthesia Quick Evaluation

## 2015-11-16 NOTE — H&P (Signed)
PREOPERATIVE H&P  Chief Complaint: right shoulder pain HPI: [name] is a 82 y.o. female who presents for preoperative history and physical with a diagnosis of right shoulder rotator cuff arthropathy. Symptoms are rated as moderate to severe, and have been worsening.  This is significantly impairing activities of daily living.  She has elected for surgical management.   She has failed injections, activity modification, anti-inflammatories, and assistive devices.  Preoperative X-rays demonstrate end stage degenerative changes with osteophyte formation, loss of joint space, subchondral sclerosis.  Had previous cuff repair in 2001.    Past Medical History:  Diagnosis Date  . A-fib ([place])   . Anemia   . Arthritis    osteoarthritis - back and knee  . Blood transfusion   . Cataract    bilateral  . Chronic back pain   . History of stomach ulcers   . Hypertension   . Pacemaker   . Status post lumbar surgery    Past Surgical History:  Procedure Laterality Date  . ABDOMINAL HYSTERECTOMY    . BACK SURGERY     five back surgeries  . CHOLECYSTECTOMY    . COLONOSCOPY    . CRANIOTOMY     ? subdural hematoma?  Sustained head injury after falling while taking Coumadin  . ESOPHAGOGASTRODUODENOSCOPY    . EYE SURGERY Bilateral    cataract surgery with lens implant  . FRACTURE SURGERY     left wrist, with plate and removal of plate  . INSERT / REPLACE / REMOVE PACEMAKER    . KNEE ARTHROPLASTY     right knee  . SHOULDER OPEN ROTATOR CUFF REPAIR  right   Social History   Social History  . Marital status: Widowed    Spouse name: N/A  . Number of children: N/A  . Years of education: N/A   Social History Main Topics  . Smoking status: Never Smoker  . Smokeless tobacco: Never Used  . Alcohol use No  . Drug use: No  . Sexual activity: Not Asked   Other Topics Concern  . None   Social History Narrative  . None   Family History  Problem Relation Age of Onset  . Uterine  cancer Mother   . Diabetes Mother   . Alcoholism Father   . Diabetes Brother   . Throat cancer Brother   . Diabetes Brother   . Diabetes Brother    Allergies  Allergen Reactions  . Amlodipine Anaphylaxis    Swelling of throat with Amlodipine; but does tolerate Tribenzor  . Penicillins Hives   Prior to Admission medications   Medication Sig Start Date End Date Taking? Authorizing Provider  alendronate (FOSAMAX) 70 MG tablet Take 70 mg by mouth once a week. Take with a full glass of water on an empty stomach.   Yes Historical Provider, MD  aspirin EC 81 MG tablet Take 81 mg by mouth at bedtime.     Yes Historical Provider, MD  celecoxib (CELEBREX) 200 MG capsule Take 200 mg by mouth daily.   Yes Historical Provider, MD  Cholecalciferol (VITAMIN D3 PO) Take 1 tablet by mouth daily.   Yes Historical Provider, MD  co-enzyme Q-10 30 MG capsule Take 30 mg by mouth daily.   Yes Historical Provider, MD  cyanocobalamin ([employer] VITAMIN B12) 2000 MCG tablet Take 2,000 mcg by mouth daily.   Yes Historical Provider, MD  Flaxseed, Linseed, (FLAXSEED OIL) 1000 MG CAPS Take 1,000 mg by mouth daily.   Yes Historical Provider, MD  flecainide (TAMBOCOR) 100 MG tablet Take 50-100 mg by mouth 2 (two) times daily. Take 1/2 tab in the am & 1 tab in the pm   Yes Historical Provider, MD  Glucosamine HCl (GLUCOSAMINE PO) Take 1 tablet by mouth daily.   Yes Historical Provider, MD  hydrochlorothiazide (HYDRODIURIL) 25 MG tablet Take 25 mg by mouth daily.   Yes Historical Provider, MD  Kelp 100 MG TABS Take 100 mg by mouth daily.   Yes Historical Provider, MD  Multiple Vitamin (MULTIVITAMIN) tablet Take 1 tablet by mouth daily.   Yes Historical Provider, MD  omeprazole (PRILOSEC) 20 MG capsule Take 20 mg by mouth daily.   Yes Historical Provider, MD  oxyCODONE-acetaminophen (PERCOCET) 10-325 MG per tablet Take 1-2 tablets by mouth every 4 (four) hours as needed for pain. For pain    Yes Historical Provider, MD   valsartan (DIOVAN) 320 MG tablet Take 320 mg by mouth daily.   Yes Historical Provider, MD  ALPRAZolam Duanne Moron) 0.25 MG tablet Take 0.25 mg by mouth at bedtime as needed for anxiety.    Historical Provider, MD  meloxicam (MOBIC) 15 MG tablet Take 15 mg by mouth daily.    Historical Provider, MD     Positive ROS: All other systems have been reviewed and were otherwise negative with the exception of those mentioned in the HPI and as above.  Physical Exam: General: Alert, no acute distress Cardiovascular: No pedal edema Respiratory: No cyanosis, no use of accessory musculature GI: No organomegaly, abdomen is soft and non-tender Skin: No lesions in the area of chief complaint Neurologic: Sensation intact distally Psychiatric: Patient is competent for consent with normal mood and affect Lymphatic: No axillary or cervical lymphadenopathy  MUSCULOSKELETAL: Right shoulder active motion 0-90 with profound weakness of both supraspinatus and infraspinatus. Positive crepitance, with a painful arc of motion..  Assessment: right shoulder rotator cuff arthropathy   Plan: Plan for Procedure(s): RIGHT REVERSE TOTAL SHOULDER ARTHROPLASTY  The risks benefits and alternatives were discussed with the patient including but not limited to the risks of nonoperative treatment, versus surgical intervention including infection, bleeding, nerve injury,  blood clots, cardiopulmonary complications, morbidity, mortality, among others, and they were willing to proceed. We have also discussed the risks or brachioplexus injury, dislocation, the potential need for future revision surgery, among others  Johnny Bridge, MD Cell [phone]   11/16/2015 10:12 AM

## 2015-11-16 NOTE — Anesthesia Procedure Notes (Addendum)
Anesthesia Regional Block:  Interscalene brachial plexus block  Pre-Anesthetic Checklist: ,, timeout performed, Correct Patient, Correct Site, Correct Laterality, Correct Procedure, Correct Position, site marked, Risks and benefits discussed,  Surgical consent,  Pre-op evaluation,  At surgeon's request and post-op pain management  Laterality: Right  Prep: chloraprep       Needles:  Injection technique: Single-shot  Needle Type: Echogenic Stimulator Needle     Needle Length: 5cm 5 cm Needle Gauge: 21 and 21 G    Additional Needles:  Procedures: ultrasound guided (picture in chart) and nerve stimulator Interscalene brachial plexus block  Nerve Stimulator or Paresthesia:  Response: 0.4 mA,   Additional Responses:   Narrative:  Start time: 11/16/2015 10:20 AM End time: 11/16/2015 10:30 AM Injection made incrementally with aspirations every 5 mL.  Performed by: Personally  Anesthesiologist: Lillia Abed  Additional Notes: Monitors applied. Patient sedated. Sterile prep and drape,hand hygiene and sterile gloves were used. Relevant anatomy identified.Needle position confirmed.Local anesthetic injected incrementally after negative aspiration. Local anesthetic spread visualized around nerve(s). Vascular puncture avoided. No complications. Image printed for medical record.The patient tolerated the procedure well.

## 2015-11-16 NOTE — Progress Notes (Signed)
PHARMACIST - PHYSICIAN ORDER COMMUNICATION  CONCERNING: P&T Medication Policy on Herbal Medications  DESCRIPTION:  This patient's orders for:  Co Q 10, Flaxseed oil, and Kelp  have been noted.  This product(s) is classified as an "herbal" or natural product. Due to a lack of definitive safety studies or FDA approval, nonstandard manufacturing practices, plus the potential risk of unknown drug-drug interactions while on inpatient medications, the Pharmacy and Therapeutics Committee does not permit the use of "herbal" or natural products of this type within [facility].   ACTION TAKEN: The pharmacy department is unable to verify this order at this time and your patient has been informed of this safety policy. Please reevaluate patient's clinical condition at discharge and address if the herbal or natural product(s) should be resumed at that time.  Reatha Harps, Pharm.D., BCPS Clinical Pharmacist

## 2015-11-17 ENCOUNTER — Encounter (HOSPITAL_COMMUNITY): Payer: Self-pay | Admitting: Orthopedic Surgery

## 2015-11-17 LAB — CBC
HEMATOCRIT: 25.8 % — AB (ref 36.0–46.0)
HEMOGLOBIN: 8.1 g/dL — AB (ref 12.0–15.0)
MCH: 31.4 pg (ref 26.0–34.0)
MCHC: 31.4 g/dL (ref 30.0–36.0)
MCV: 100 fL (ref 78.0–100.0)
Platelets: 175 10*3/uL (ref 150–400)
RBC: 2.58 MIL/uL — ABNORMAL LOW (ref 3.87–5.11)
RDW: 13.9 % (ref 11.5–15.5)
WBC: 4.5 10*3/uL (ref 4.0–10.5)

## 2015-11-17 LAB — BASIC METABOLIC PANEL
Anion gap: 7 (ref 5–15)
BUN: 14 mg/dL (ref 6–20)
CHLORIDE: 105 mmol/L (ref 101–111)
CO2: 26 mmol/L (ref 22–32)
CREATININE: 0.95 mg/dL (ref 0.44–1.00)
Calcium: 8.5 mg/dL — ABNORMAL LOW (ref 8.9–10.3)
GFR calc non Af Amer: 54 mL/min — ABNORMAL LOW (ref >60)
Glucose, Bld: 96 mg/dL (ref 65–99)
Potassium: 4.3 mmol/L (ref 3.5–5.1)
Sodium: 138 mmol/L (ref 135–145)

## 2015-11-17 LAB — GLUCOSE, CAPILLARY: Glucose-Capillary: 144 mg/dL — ABNORMAL HIGH (ref 65–99)

## 2015-11-17 NOTE — Evaluation (Signed)
Occupational Therapy Evaluation Patient Details Name: [name] MRN: [id number] DOB: [birth year] Today's Date: 11/17/2015    History of Present Illness s/p Reverse TSA with excision of subcutaneous lipoma. H/O shoulder sx in '01   Clinical Impression   This 82 year old female was admitted for the above sx. At baseline, she is very independent.  She will have 24/7 assistance at home.  Educated on shoulder protocol.  Will need to further educate caregivers when they are present    Follow Up Recommendations  Supervision/Assistance - 24 hour (Pt will follow up with Dr Mardelle Matte for further rehab)    Equipment Recommendations  None recommended by OT    Recommendations for Other Services       Precautions / Restrictions Precautions Precautions: Shoulder;Fall Type of Shoulder Precautions: conservative:  sling at all times x ADLs, elbow to finger ROM OK, no shoulder movement, NWB Precaution Booklet Issued: Yes (comment) Restrictions RUE Weight Bearing: Non weight bearing      Mobility Bed Mobility Overal bed mobility: Needs Assistance Bed Mobility: Supine to Sit;Sit to Supine     Supine to sit: Supervision Sit to supine: Min assist   General bed mobility comments: assist for RLE when getting back to bed  Transfers Overall transfer level: Needs assistance Equipment used: 1 person hand held assist (pt used bedrail and 3:1 rail to stabilize) Transfers: Sit to/from [facility] Sit to Stand: Min guard Stand pivot transfers: Min guard       General transfer comment: for safety    Balance Overall balance assessment: Needs assistance         Standing balance support: During functional activity Standing balance-Leahy Scale: Poor Standing balance comment: held onto rail at all times                            ADL Overall ADL's : Needs assistance/impaired     Grooming: Set up;Sitting   Upper Body Bathing: Moderate assistance;Sitting    Lower Body Bathing: Moderate assistance;Sit to/from stand   Upper Body Dressing : Maximal assistance;Sitting   Lower Body Dressing: Maximal assistance;Sit to/from stand   Toilet Transfer: Min guard;Stand-pivot;BSC   Toileting- Water quality scientist and Hygiene: Set up;Sitting/lateral lean         General ADL Comments: educated on shoulder precautions/protocol and handout given.  Verbalizes understanding. Will need to perform UB dressing--not done on this visit and also educate caregivers, who were not present.  See education section of chart  Demonstrated washing under arm and adjusted sling.  Pt is very independent     Estate agent      Pertinent Vitals/Pain Pain Assessment: 0-10 Pain Score: 7  Pain Location: R shoulder Pain Descriptors / Indicators: Aching Pain Intervention(s): Limited activity within patient's tolerance;Monitored during session;Premedicated before session;Repositioned;Ice applied     Hand Dominance Right   Extremity/Trunk Assessment Upper Extremity Assessment Upper Extremity Assessment: RUE deficits/detail (immobilized. finger/wrist WFLS. reports L shoulder also bad)           Communication Communication Communication: No difficulties   Cognition Arousal/Alertness: Awake/alert Behavior During Therapy: WFL for tasks assessed/performed Overall Cognitive Status: Within Functional Limits for tasks assessed                     General Comments       Exercises       Shoulder Instructions  Home Living Family/patient expects to be discharged to:: Private residence Living Arrangements: Alone Available Help at Discharge: Family (son and his girlfriend will assist)               Bathroom Shower/Tub: Tub/shower unit Shower/tub characteristics: Curtain Biochemist, clinical: Standard     Home Equipment: Bedside commode;Tub bench          Prior Functioning/Environment Level of Independence: Independent              OT Diagnosis: Acute pain   OT Problem List: Decreased activity tolerance;Pain;Impaired UE functional use;Decreased strength;Decreased knowledge of precautions   OT Treatment/Interventions: Self-care/ADL training;DME and/or AE instruction;Balance training;Patient/family education    OT Goals(Current goals can be found in the care plan section) Acute Rehab OT Goals Patient Stated Goal: less pain OT Goal Formulation: With patient Time For Goal Achievement: 11/24/15 Potential to Achieve Goals: Good ADL Goals Additional ADL Goal #1: family will don sling with supervision and verbalize understanding of shoulder protocol  OT Frequency: Min 2X/week   Barriers to D/C:            Co-evaluation              End of Session    Activity Tolerance: Patient tolerated treatment well Patient left: in bed;with call bell/phone within reach;with bed alarm set   Time: [id number] OT Time Calculation (min): 20 min Charges:  OT General Charges $OT Visit: 1 Procedure OT Evaluation $OT Eval Low Complexity: 1 Procedure G-Codes:    Nattie Lazenby 11-19-2015, 9:55 AM Lesle Chris, OTR/L [phone] 2015/11/19

## 2015-11-17 NOTE — Progress Notes (Signed)
PT Cancellation Note  Patient Details Name: ALYAH WITHEROW MRN: [id number] DOB: Apr 08, 1931   Cancelled Treatment:    Reason Eval/Treat Not Completed: Pain limiting ability to participate;Other (comment) (Pt states she cannot do anything until it is time for meds).  Will try later as time and pt allow.   Ramond Dial 11/17/2015, 10:14 AM    Mee Hives, PT MS Acute Rehab Dept. Number: [place] [facility] [place]

## 2015-11-17 NOTE — Anesthesia Postprocedure Evaluation (Signed)
Anesthesia Post Note  Patient: [name]  Procedure(s) Performed: Procedure(s) (LRB): REVERSE SHOULDER ARTHROPLASTY (Right)  Patient location during evaluation: PACU Anesthesia Type: General and Regional Level of consciousness: awake Pain management: pain level controlled Vital Signs Assessment: post-procedure vital signs reviewed and stable Respiratory status: spontaneous breathing Cardiovascular status: stable Postop Assessment: no signs of nausea or vomiting Anesthetic complications: no    Last Vitals:  Vitals:   11/17/15 0007 11/17/15 0504  BP: (!) 119/104 128/62  Pulse: 76 82  Resp: 18 18  Temp: 37.7 C (!) 38 C    Last Pain:  Vitals:   11/17/15 1245  TempSrc:   PainSc: 5                  Kashira Behunin

## 2015-11-17 NOTE — Progress Notes (Addendum)
Patient ID: [name], female   DOB: March 15, 1932, 82 y.o.   MRN: [id number]     Subjective:  Patient reports pain as mild.  In bed and denies any CP or SOB.    Objective:   VITALS:   Vitals:   11/16/15 1549 11/16/15 2151 11/17/15 0007 11/17/15 0504  BP: (!) 144/57 (!) 113/57 (!) 119/104 128/62  Pulse: (!) 120 80 76 82  Resp: 16 18 18 18   Temp: 97.9 F (36.6 C) 99.3 F (37.4 C) 99.9 F (37.7 C) (!) 100.4 F (38 C)  TempSrc: Oral Oral Oral Oral  SpO2: 92% 98% 100% 96%  Weight:      Height:        ABD soft Sensation intact distally Dorsiflexion/Plantar flexion intact Incision: dressing C/D/I and no drainage Good hand and wrist motion  Lab Results  Component Value Date   WBC 4.5 11/17/2015   HGB 8.1 (L) 11/17/2015   HCT 25.8 (L) 11/17/2015   MCV 100.0 11/17/2015   PLT 175 11/17/2015   BMET    Component Value Date/Time   NA 138 11/17/2015 0530   K 4.3 11/17/2015 0530   CL 105 11/17/2015 0530   CO2 26 11/17/2015 0530   GLUCOSE 96 11/17/2015 0530   BUN 14 11/17/2015 0530   CREATININE 0.95 11/17/2015 0530   CALCIUM 8.5 (L) 11/17/2015 0530   GFRNONAA 54 (L) 11/17/2015 0530   GFRAA >60 11/17/2015 0530     Assessment/Plan: 1 Day Post-Op   Principal Problem:   Rotator cuff tear arthropathy of right shoulder Active Problems:   S/P shoulder replacement   Advance diet Up with therapy Plan for discharge tomorrow ABLA patient anemic at baseline continue to monitor Dry dressing PRN Sling at all times  No shoulder motion   DOUGLAS PARRY, BRANDON 11/17/2015, 8:54 AM  Discussed and agree with above.  Spoke with patient over the phone.    ABLA: acute on chronic.  Observe.  Asymptomatic right now.    Marchia Bond, MD Cell [phone]

## 2015-11-17 NOTE — Evaluation (Signed)
Physical Therapy Evaluation Patient Details Name: [name] MRN: [id number] DOB: 07-28-1931 Today's Date: 11/17/2015   History of Present Illness  s/p Reverse TSA with excision of subcutaneous lipoma. H/O shoulder sx in '01  Clinical Impression  Pt able to ambulate 55 feet with SPC and min guard assistance. Pt with unsteady gait pattern but no gross loss of balance. Pt and son report that this is her typical pattern. Discussed recommendation of HHPT services but pt declining at this time. PT to follow and maximize mobility and safety during acute stay.     Follow Up Recommendations Home health PT;Supervision for mobility/OOB (pt refusing HHPT at this time. )    Equipment Recommendations  None recommended by PT    Recommendations for Other Services       Precautions / Restrictions Precautions Precautions: Shoulder;Fall Type of Shoulder Precautions: conservative:  sling at all times x ADLs, elbow to finger ROM OK, no shoulder movement, NWB Required Braces or Orthoses: Sling Restrictions Weight Bearing Restrictions: Yes RUE Weight Bearing: Non weight bearing      Mobility  Bed Mobility Overal bed mobility: Needs Assistance Bed Mobility: Supine to Sit;Sit to Supine     Supine to sit: Supervision Sit to supine: Supervision   General bed mobility comments: HOB elevated approx. 30 degrees.   Transfers Overall transfer level: Needs assistance Equipment used:  (bed rail) Transfers: Sit to/from Stand Sit to Stand: Supervision         General transfer comment: supervision for safety, pt using bed rail to assist to standing.   Ambulation/Gait Ambulation/Gait assistance: Min guard Ambulation Distance (Feet): 55 Feet Assistive device: Straight cane Gait Pattern/deviations: Step-through pattern;Wide base of support;Trunk flexed Gait velocity: decreased   General Gait Details: Pt with unsteady gait pattern but no loss of balance. Pt reports that this is her typical  gait pattern (confirmed by son).   Stairs            Wheelchair Mobility    Modified Rankin (Stroke Patients Only)       Balance Overall balance assessment: Needs assistance Sitting-balance support: No upper extremity supported Sitting balance-Leahy Scale: Good     Standing balance support: No upper extremity supported Standing balance-Leahy Scale: Fair Standing balance comment: able to stand static without UE support.                              Pertinent Vitals/Pain Pain Assessment: 0-10 Pain Score: 2  Pain Location: Rt shoulder Pain Descriptors / Indicators: Aching Pain Intervention(s): Limited activity within patient's tolerance;Monitored during session;Ice applied    Home Living Family/patient expects to be discharged to:: Private residence Living Arrangements: Alone Available Help at Discharge: Family;Friend(s);Available 24 hours/day Type of Home: House Home Access: Ramped entrance     Home Layout: One level Home Equipment: Walker - 2 wheels;Cane - single point Additional Comments: Pt states that her friend and her son will be staying with her. Son present and confirms plan.     Prior Function Level of Independence: Independent with assistive device(s)         Comments: used rw or spc for ambulation     Hand Dominance        Extremity/Trunk Assessment   Upper Extremity Assessment: Defer to OT evaluation           Lower Extremity Assessment: Generalized weakness      Cervical / Trunk Assessment: Kyphotic  Communication   Communication:  No difficulties  Cognition Arousal/Alertness: Awake/alert Behavior During Therapy: WFL for tasks assessed/performed Overall Cognitive Status: Within Functional Limits for tasks assessed                      General Comments General comments (skin integrity, edema, etc.): Discussed HHPT recommendation for home for gait and safety. Pt refusing at this time.     Exercises         Assessment/Plan    PT Assessment Patient needs continued PT services  PT Diagnosis Difficulty walking   PT Problem List Decreased activity tolerance;Decreased balance;Decreased strength;Decreased mobility  PT Treatment Interventions DME instruction;Gait training;Functional mobility training;Therapeutic activities;Balance training;Patient/family education   PT Goals (Current goals can be found in the Care Plan section) Acute Rehab PT Goals Patient Stated Goal: get home PT Goal Formulation: With patient Time For Goal Achievement: 12/01/15 Potential to Achieve Goals: Good    Frequency Min 4X/week   Barriers to discharge        Co-evaluation               End of Session Equipment Utilized During Treatment: Gait belt Activity Tolerance: Patient tolerated treatment well Patient left: in bed;with call bell/phone within reach;with family/visitor present;with SCD's reapplied (refused to sit in chair following session. ) Nurse Communication: Mobility status         [id number] PT Time Calculation (min) (ACUTE ONLY): 23 min   Charges:   PT Evaluation $PT Eval Moderate Complexity: 1 Procedure PT Treatments $Gait Training: 8-22 mins   PT G Codes:        Cassell Clement, PT, CSCS Pager [phone] Office [phone]  11/17/2015, 2:20 PM

## 2015-11-18 LAB — CBC
HEMATOCRIT: 26.6 % — AB (ref 36.0–46.0)
HEMOGLOBIN: 8.3 g/dL — AB (ref 12.0–15.0)
MCH: 31.7 pg (ref 26.0–34.0)
MCHC: 31.2 g/dL (ref 30.0–36.0)
MCV: 101.5 fL — AB (ref 78.0–100.0)
Platelets: 166 10*3/uL (ref 150–400)
RBC: 2.62 MIL/uL — ABNORMAL LOW (ref 3.87–5.11)
RDW: 13.9 % (ref 11.5–15.5)
WBC: 5.9 10*3/uL (ref 4.0–10.5)

## 2015-11-18 NOTE — Progress Notes (Addendum)
Occupational Therapy Treatment Patient Details Name: [name] MRN: [id number] DOB: [birth year] Today's Date: 11/18/2015    History of present illness s/p Reverse TSA with excision of subcutaneous lipoma. H/O shoulder sx in '01   OT comments  No family present for sling and ADL technique education. Pt states that she did not want to get dressed right now and would wait. Pt also stated that she didn't need to wait for her son to arrive for educations as she has been through this before and she knows what to do and is able to tell her family what they need to do to assist her. All education reviewed/completed and no further acute OT indicated at this time  Follow Up Recommendations  Supervision/Assistance - 24 hour (progress rehab of shoulder per MD)    Equipment Recommendations  None recommended by OT    Recommendations for Other Services      Precautions / Restrictions Precautions Precautions: Shoulder;Fall Type of Shoulder Precautions: conservative:  sling at all times x ADLs, elbow to finger ROM OK, no shoulder movement, NWB Required Braces or Orthoses: Sling Restrictions Weight Bearing Restrictions: Yes RUE Weight Bearing: Non weight bearing       Mobility Bed Mobility Overal bed mobility: Needs Assistance Bed Mobility: Supine to Sit;Sit to Supine     Supine to sit: Supervision Sit to supine: Supervision   General bed mobility comments: HOB approx 20 degrees.   Transfers Overall transfer level: Needs assistance Equipment used: None Transfers: Sit to/from Stand Sit to Stand: Supervision         General transfer comment: pt using Lt UE at rail to assist.     Balance Overall balance assessment: Needs assistance Sitting-balance support: No upper extremity supported Sitting balance-Leahy Scale: Good     Standing balance support: During functional activity Standing balance-Leahy Scale: Fair Standing balance comment: using SPC during ambulation                    ADL           Upper Body Bathing: Minimal assitance;Sitting       Upper Body Dressing : Minimal assistance                     General ADL Comments: pt declined OT assisting her with dressing in prep for d/c today, however pt able to verbalize correct techniques for UB bathing and dressing      Vision  no change from baseline                              Cognition   Behavior During Therapy: [facility] for tasks assessed/performed Overall Cognitive Status: Within Functional Limits for tasks assessed                       Extremity/Trunk Assessment   R UE immobilized in sling            Exercises Other Exercises NO A/PROM of R shoulder Other Exercises: ROM of R elbow, wrist and hand Donning/doffing shirt without moving shoulder: Minimal assistance Method for sponge bathing under operated UE: Patient able to independently direct caregiver;Supervision/safety Donning/doffing sling/immobilizer: Patient able to independently direct caregiver;Minimal assistance Correct positioning of sling/immobilizer: Minimal assistance;Patient able to independently direct caregiver ROM for elbow, wrist and digits of operated UE: Supervision/safety Sling wearing schedule (on at all times/off for ADL's): Independent;Patient able to independently direct caregiver  Proper positioning of operated UE when showering: Supervision/safety;Patient able to independently direct caregiver Positioning of UE while sleeping: Supervision/safety;Patient able to independently direct caregiver   Shoulder Instructions Shoulder Instructions Donning/doffing shirt without moving shoulder: Minimal assistance Method for sponge bathing under operated UE: Patient able to independently direct caregiver;Supervision/safety Donning/doffing sling/immobilizer: Patient able to independently direct caregiver;Minimal assistance Correct positioning of sling/immobilizer: Minimal  assistance;Patient able to independently direct caregiver ROM for elbow, wrist and digits of operated UE: Supervision/safety Sling wearing schedule (on at all times/off for ADL's): Independent;Patient able to independently direct caregiver Proper positioning of operated UE when showering: Supervision/safety;Patient able to independently direct caregiver Positioning of UE while sleeping: Supervision/safety;Patient able to independently direct caregiver     General Comments      Pertinent Vitals/ Pain       Pain Assessment: 0-10 Pain Score: 3  Faces Pain Scale: Hurts a little bit Pain Location: R shoulder Pain Descriptors / Indicators: Sore Pain Intervention(s): Limited activity within patient's tolerance;Monitored during session  Home Living  with family                                        Prior Functioning/Environment   independent                  Progress Toward Goals  OT Goals(current goals can now be found in the care plan section)  Progress towards OT goals: Progressing toward goals  Acute Rehab OT Goals Patient Stated Goal: get home  Plan All goals met and education completed, patient discharged from OT services;Discharge plan remains appropriate                     End of Session     Activity Tolerance Patient tolerated treatment well   Patient Left in bed;with call bell/phone within reach;with bed alarm set   Nurse Communication          Time: [phone]  OT Time Calculation (min): 26 min  Charges: OT General Charges $OT Visit: 1 Procedure OT Treatments $Self Care/Home Management : 8-22 mins $Therapeutic Activity: 8-22 mins  Britt Bottom 11/18/2015, 10:36 AM

## 2015-11-18 NOTE — Discharge Summary (Signed)
Physician Discharge Summary  Patient ID: [name] MRN: [id number] DOB/AGE: [birth year] 80 y.o.  Admit date: 11/16/2015 Discharge date: 11/18/2015  Admission Diagnoses:  Rotator cuff tear arthropathy of right shoulder  Discharge Diagnoses:  Principal Problem:   Rotator cuff tear arthropathy of right shoulder Active Problems:   S/P shoulder replacement Acute on chronic blood loss anemia, asymptomatic  Past Medical History:  Diagnosis Date  . A-fib ([place])   . Anemia   . Arthritis    osteoarthritis - back and knee  . Blood transfusion   . Cataract    bilateral  . Chronic back pain   . History of stomach ulcers   . Hypertension   . Pacemaker   . Rotator cuff tear arthropathy of right shoulder 11/16/2015  . Status post lumbar surgery     Surgeries: Procedure(s): REVERSE SHOULDER ARTHROPLASTY on 11/16/2015   Consultants (if any):   Discharged Condition: Improved  Hospital Course: [name] is an 82 y.o. female who was admitted 11/16/2015 with a diagnosis of Rotator cuff tear arthropathy of right shoulder and went to the operating room on 11/16/2015 and underwent the above named procedures.    She was given perioperative antibiotics:  Anti-infectives    Start     Dose/Rate Route Frequency Ordered Stop   11/16/15 2300  ceFAZolin (ANCEF) IVPB 2g/100 mL premix     2 g 200 mL/hr over 30 Minutes Intravenous Every 12 hours 11/16/15 1540 11/17/15 1133   11/16/15 1030  ceFAZolin (ANCEF) IVPB 2g/100 mL premix     2 g 200 mL/hr over 30 Minutes Intravenous To [facility] 11/16/15 0926 11/16/15 1119    .  She was given sequential compression devices, early ambulation for DVT prophylaxis.  She was neurovascularly intact in the right upper extremity with no symptoms of anemia.  She benefited maximally from the hospital stay and there were no complications.    Recent vital signs:  Vitals:   11/18/15 0620 11/18/15 0635  BP: (!) 109/54 132/62  Pulse: 92 99   Resp: 18 18  Temp: 98.7 F (37.1 C) 98.3 F (36.8 C)    Recent laboratory studies:  Lab Results  Component Value Date   HGB 8.3 (L) 11/18/2015   HGB 8.1 (L) 11/17/2015   HGB 11.1 (L) 11/05/2015   Lab Results  Component Value Date   WBC 5.9 11/18/2015   PLT 166 11/18/2015   Lab Results  Component Value Date   INR 1.01 12/26/2010   Lab Results  Component Value Date   NA 138 11/17/2015   K 4.3 11/17/2015   CL 105 11/17/2015   CO2 26 11/17/2015   BUN 14 11/17/2015   CREATININE 0.95 11/17/2015   GLUCOSE 96 11/17/2015    Discharge Medications:     Medication List    STOP taking these medications   celecoxib 200 MG capsule Commonly known as:  CELEBREX   meloxicam 15 MG tablet Commonly known as:  MOBIC     TAKE these medications   alendronate 70 MG tablet Commonly known as:  FOSAMAX Take 70 mg by mouth once a week. Take with a full glass of water on an empty stomach.   ALPRAZolam 0.25 MG tablet Commonly known as:  XANAX Take 0.25 mg by mouth at bedtime as needed for anxiety.   aspirin EC 81 MG tablet Take 81 mg by mouth at bedtime.   baclofen 10 MG tablet Commonly known as:  LIORESAL Take 1 tablet (10 mg total) by  mouth 3 (three) times daily. As needed for muscle spasm   co-enzyme Q-10 30 MG capsule Take 30 mg by mouth daily.   [employer] VITAMIN B12 2000 MCG tablet Generic drug:  cyanocobalamin Take 2,000 mcg by mouth daily.   Flaxseed Oil 1000 MG Caps Take 1,000 mg by mouth daily.   flecainide 100 MG tablet Commonly known as:  TAMBOCOR Take 50-100 mg by mouth 2 (two) times daily. Take 1/2 tab in the am & 1 tab in the pm   GLUCOSAMINE PO Take 1 tablet by mouth daily.   hydrochlorothiazide 25 MG tablet Commonly known as:  HYDRODIURIL Take 25 mg by mouth daily.   Kelp 100 MG Tabs Take 100 mg by mouth daily.   multivitamin tablet Take 1 tablet by mouth daily.   omeprazole 20 MG capsule Commonly known as:  PRILOSEC Take 20 mg by mouth daily.    ondansetron 4 MG tablet Commonly known as:  ZOFRAN Take 1 tablet (4 mg total) by mouth every 8 (eight) hours as needed for nausea or vomiting.   oxyCODONE-acetaminophen 10-325 MG tablet Commonly known as:  PERCOCET Take 1-2 tablets by mouth every 4 (four) hours as needed for pain. For pain   sennosides-docusate sodium 8.6-50 MG tablet Commonly known as:  SENOKOT-S Take 2 tablets by mouth daily.   valsartan 320 MG tablet Commonly known as:  DIOVAN Take 320 mg by mouth daily.   VITAMIN D3 PO Take 1 tablet by mouth daily.       Diagnostic Studies: Dg Shoulder Right Port  Result Date: 11/16/2015 CLINICAL DATA:  82 year old female status post right shoulder arthroplasty. Initial encounter. EXAM: PORTABLE RIGHT SHOULDER - 2+ VIEW COMPARISON:  Right shoulder CT 10/04/2015 and earlier. FINDINGS: AP portable view of the right shoulder at 1422 hours. New total shoulder arthroplasty hardware in place. Surrounding postoperative changes to the soft tissues including joint space and subcutaneous gas. Arthroplasty hardware appears intact. No unexpected osseous finding. Extensive spinal postoperative changes again noted. Visible right lung appears stable. IMPRESSION: Right total shoulder arthroplasty with no adverse features identified. Electronically Signed   By: Genevie Ann M.D.   On: 11/16/2015 14:41    Disposition: 01-Home or Self Care    Follow-up Information    Andrianna Manalang P, MD. Schedule an appointment as soon as possible for a visit in 2 weeks.   Specialty:  Orthopedic Surgery Contact information: [place] [address] [phone]            Signed: Johnny Bridge 11/18/2015, 7:14 AM

## 2015-11-18 NOTE — Progress Notes (Signed)
Physical Therapy Treatment Patient Details Name: [name] MRN: [id number] DOB: [birth year] Today's Date: 11/18/2015    History of Present Illness s/p Reverse TSA with excision of subcutaneous lipoma. H/O shoulder sx in '01    PT Comments    Pt able to ambulate 60 ft with SPC and min guard assist. Pt has an unsteady gait pattern but no loss of balance during session. Discussed recommendation of having assistance with ambulation and transfers at home for safety, which the pt agreed with. Pt also declined any HHPT services. PT to continue to follow acutely. Pt confirming that she will have her son and friend at her home to help her as needed.   Follow Up Recommendations  Supervision for mobility/OOB (pt refusing HHPT services)     Equipment Recommendations  None recommended by PT    Recommendations for Other Services       Precautions / Restrictions Precautions Precautions: Shoulder;Fall Type of Shoulder Precautions: conservative:  sling at all times x ADLs, elbow to finger ROM OK, no shoulder movement, NWB Restrictions Weight Bearing Restrictions: Yes RUE Weight Bearing: Non weight bearing    Mobility  Bed Mobility Overal bed mobility: Needs Assistance Bed Mobility: Supine to Sit;Sit to Supine     Supine to sit: Supervision Sit to supine: Supervision   General bed mobility comments: HOB approx 20 degrees.   Transfers Overall transfer level: Needs assistance Equipment used: None Transfers: Sit to/from Stand Sit to Stand: Supervision         General transfer comment: pt using Lt UE at rail to assist.   Ambulation/Gait Ambulation/Gait assistance: Min guard Ambulation Distance (Feet): 60 Feet Assistive device: Straight cane Gait Pattern/deviations: Step-through pattern;Trunk flexed;Wide base of support Gait velocity: decreased   General Gait Details: Pt with flexed trunk and unsteady pattern but no loss of balance. Pt confirms that this is her usual gait  pattern. Encouraged pt to have assistance at home with transfers and ambulation. Pt verbalizes agreement and understanding.    Stairs            Wheelchair Mobility    Modified Rankin (Stroke Patients Only)       Balance Overall balance assessment: Needs assistance Sitting-balance support: No upper extremity supported Sitting balance-Leahy Scale: Good     Standing balance support: During functional activity Standing balance-Leahy Scale: Fair Standing balance comment: using SPC during ambulation                    Cognition Arousal/Alertness: Awake/alert Behavior During Therapy: WFL for tasks assessed/performed Overall Cognitive Status: Within Functional Limits for tasks assessed                      Exercises      General Comments General comments (skin integrity, edema, etc.): Pt refused HHPT services.       Pertinent Vitals/Pain Pain Assessment: Faces Faces Pain Scale: Hurts a little bit Pain Location: Rt shoulder Pain Descriptors / Indicators: Sore Pain Intervention(s): Limited activity within patient's tolerance;Monitored during session    Home Living                      Prior Function            PT Goals (current goals can now be found in the care plan section) Acute Rehab PT Goals Patient Stated Goal: get home PT Goal Formulation: With patient Time For Goal Achievement: 12/01/15 Potential to Achieve Goals: Good Progress towards  PT goals: Progressing toward goals    Frequency  Min 4X/week    PT Plan Current plan remains appropriate    Co-evaluation             End of Session Equipment Utilized During Treatment: Gait belt Activity Tolerance: Patient tolerated treatment well Patient left: in bed;with call bell/phone within reach     Time: 0821-0842 PT Time Calculation (min) (ACUTE ONLY): 21 min  Charges:  $Gait Training: 8-22 mins                    G Codes:      Cassell Clement, PT, CSCS Pager [phone] Office [phone]  11/18/2015, 8:57 AM

## 2015-11-18 NOTE — Progress Notes (Signed)
Pt ready for d/c today per MD. Pt met OT/PT goals, has a cane at home & will have her son and a friend at home to assist her. Discharge instructions and prescriptions reviewed with pt and son, all questions answered. Pt will be assisted to car by NT, belongings sent with son.   [place], Jerry Caras

## 2015-11-19 ENCOUNTER — Emergency Department (HOSPITAL_COMMUNITY)
Admission: EM | Admit: 2015-11-19 | Discharge: 2015-11-20 | Disposition: A | Discharge status: Home / Self Care | Payer: Medicare Other | Attending: Emergency Medicine | Admitting: Emergency Medicine

## 2015-11-19 ENCOUNTER — Encounter (HOSPITAL_COMMUNITY): Payer: Self-pay | Admitting: *Deleted

## 2015-11-19 DIAGNOSIS — I10 Essential (primary) hypertension: Secondary | ICD-10-CM | POA: Diagnosis not present

## 2015-11-19 DIAGNOSIS — Z96611 Presence of right artificial shoulder joint: Secondary | ICD-10-CM | POA: Diagnosis not present

## 2015-11-19 DIAGNOSIS — T402X5A Adverse effect of other opioids, initial encounter: Secondary | ICD-10-CM | POA: Insufficient documentation

## 2015-11-19 DIAGNOSIS — R200 Anesthesia of skin: Secondary | ICD-10-CM | POA: Diagnosis present

## 2015-11-19 DIAGNOSIS — Z7982 Long term (current) use of aspirin: Secondary | ICD-10-CM | POA: Insufficient documentation

## 2015-11-19 DIAGNOSIS — Z96651 Presence of right artificial knee joint: Secondary | ICD-10-CM | POA: Diagnosis not present

## 2015-11-19 DIAGNOSIS — Z950 Presence of cardiac pacemaker: Secondary | ICD-10-CM | POA: Diagnosis not present

## 2015-11-19 DIAGNOSIS — T50905A Adverse effect of unspecified drugs, medicaments and biological substances, initial encounter: Secondary | ICD-10-CM

## 2015-11-19 DIAGNOSIS — X58XXXA Exposure to other specified factors, initial encounter: Secondary | ICD-10-CM | POA: Insufficient documentation

## 2015-11-19 DIAGNOSIS — I480 Paroxysmal atrial fibrillation: Secondary | ICD-10-CM

## 2015-11-19 DIAGNOSIS — T887XXA Unspecified adverse effect of drug or medicament, initial encounter: Secondary | ICD-10-CM | POA: Diagnosis not present

## 2015-11-19 LAB — I-STAT CHEM 8, ED
BUN: 29 mg/dL — AB (ref 6–20)
CALCIUM ION: 1.14 mmol/L — AB (ref 1.15–1.40)
Chloride: 103 mmol/L (ref 101–111)
Creatinine, Ser: 1.3 mg/dL — ABNORMAL HIGH (ref 0.44–1.00)
Glucose, Bld: 107 mg/dL — ABNORMAL HIGH (ref 65–99)
HEMATOCRIT: 27 % — AB (ref 36.0–46.0)
HEMOGLOBIN: 9.2 g/dL — AB (ref 12.0–15.0)
Potassium: 3.9 mmol/L (ref 3.5–5.1)
SODIUM: 140 mmol/L (ref 135–145)
TCO2: 24 mmol/L (ref 0–100)

## 2015-11-19 NOTE — ED Provider Notes (Signed)
[facility] DEPT Provider Note   CSN: [id number] Arrival date & time: 11/19/15  2157  By signing my name below, I, Arianna Nassar, attest that this documentation has been prepared under the direction and in the presence of Malvin Johns, MD.  Electronically Signed: Julien Nordmann, ED Scribe. 11/19/15. 11:16 PM.    History   Chief Complaint Chief Complaint  Patient presents with  . Other    The history is provided by the patient. No language interpreter was used.   HPI Comments: [name] is a 82 y.o. female who has a PMhx of a-fib, chronic back pain, HTN, and pacemaker presents to the Emergency Department complaining of a "strange feeling" onset this morning. Pt notes associated numbness and tingling in her fingers intermittently. Pt reports having a right shoulder arthroplasty on Tuesday and was discharged yesterday. She notes taking her pain medication last night that she was prescribed (percocet and baclofen) after her surgery and reports feeling "woozy and strange" since 0800 this morning. Daughter notes pt slept more than usual today which was out of the ordinary. Pt says she has a "funny taste" in her mouth, which she describes as a yellow mucus that reminds her of anesthesia. She states "something just isn't right and I don't feel like myself". Pt denies chest pain, shortness of breath, dysuria, urinary retention, weakness, cough, nasal congestion, nausea, or vomiting.  Past Medical History:  Diagnosis Date  . A-fib ([place])   . Anemia   . Arthritis    osteoarthritis - back and knee  . Blood transfusion   . Cataract    bilateral  . Chronic back pain   . History of stomach ulcers   . Hypertension   . Pacemaker   . Rotator cuff tear arthropathy of right shoulder 11/16/2015  . Status post lumbar surgery     Patient Active Problem List   Diagnosis Date Noted  . Rotator cuff tear arthropathy of right shoulder 11/16/2015  . S/P shoulder replacement 11/16/2015  .  Clostridium difficile diarrhea 04/11/2011    Past Surgical History:  Procedure Laterality Date  . ABDOMINAL HYSTERECTOMY    . BACK SURGERY     five back surgeries  . CHOLECYSTECTOMY    . COLONOSCOPY    . CRANIOTOMY     ? subdural hematoma?  Sustained head injury after falling while taking Coumadin  . ESOPHAGOGASTRODUODENOSCOPY    . EYE SURGERY Bilateral    cataract surgery with lens implant  . FRACTURE SURGERY     left wrist, with plate and removal of plate  . INSERT / REPLACE / REMOVE PACEMAKER    . KNEE ARTHROPLASTY     right knee  . REVERSE SHOULDER ARTHROPLASTY Right 11/16/2015  . REVERSE SHOULDER ARTHROPLASTY Right 11/16/2015   Procedure: REVERSE SHOULDER ARTHROPLASTY;  Surgeon: Marchia Bond, MD;  Location: [place] [facility] [facility];  Service: Orthopedics;  Laterality: Right;  . SHOULDER OPEN ROTATOR CUFF REPAIR  right    OB History    No data available       Home Medications    Prior to Admission medications   Medication Sig Start Date End Date Taking? Authorizing Provider  alendronate (FOSAMAX) 70 MG tablet Take 70 mg by mouth once a week. Take with a full glass of water on an empty stomach.    Historical Provider, MD  ALPRAZolam Duanne Moron) 0.25 MG tablet Take 0.25 mg by mouth at bedtime as needed for anxiety.    Historical Provider, MD  aspirin EC 81 MG tablet  Take 81 mg by mouth at bedtime.      Historical Provider, MD  baclofen (LIORESAL) 10 MG tablet Take 1 tablet (10 mg total) by mouth 3 (three) times daily. As needed for muscle spasm 11/16/15   Marchia Bond, MD  Cholecalciferol (VITAMIN D3 PO) Take 1 tablet by mouth daily.    Historical Provider, MD  co-enzyme Q-10 30 MG capsule Take 30 mg by mouth daily.    Historical Provider, MD  cyanocobalamin ([employer] VITAMIN B12) 2000 MCG tablet Take 2,000 mcg by mouth daily.    Historical Provider, MD  Flaxseed, Linseed, (FLAXSEED OIL) 1000 MG CAPS Take 1,000 mg by mouth daily.    Historical Provider, MD  flecainide (TAMBOCOR) 100 MG tablet Take  50-100 mg by mouth 2 (two) times daily. Take 1/2 tab in the am & 1 tab in the pm    Historical Provider, MD  Glucosamine HCl (GLUCOSAMINE PO) Take 1 tablet by mouth daily.    Historical Provider, MD  hydrochlorothiazide (HYDRODIURIL) 25 MG tablet Take 25 mg by mouth daily.    Historical Provider, MD  Kelp 100 MG TABS Take 100 mg by mouth daily.    Historical Provider, MD  Multiple Vitamin (MULTIVITAMIN) tablet Take 1 tablet by mouth daily.    Historical Provider, MD  omeprazole (PRILOSEC) 20 MG capsule Take 20 mg by mouth daily.    Historical Provider, MD  ondansetron (ZOFRAN) 4 MG tablet Take 1 tablet (4 mg total) by mouth every 8 (eight) hours as needed for nausea or vomiting. 11/16/15   Marchia Bond, MD  oxyCODONE-acetaminophen (PERCOCET) 10-325 MG tablet Take 1-2 tablets by mouth every 4 (four) hours as needed for pain. For pain 11/16/15   Marchia Bond, MD  sennosides-docusate sodium (SENOKOT-S) 8.6-50 MG tablet Take 2 tablets by mouth daily. 11/16/15   Marchia Bond, MD  valsartan (DIOVAN) 320 MG tablet Take 320 mg by mouth daily.    Historical Provider, MD    Family History Family History  Problem Relation Age of Onset  . Uterine cancer Mother   . Diabetes Mother   . Alcoholism Father   . Diabetes Brother   . Throat cancer Brother   . Diabetes Brother   . Diabetes Brother     Social History Social History  Substance Use Topics  . Smoking status: Never Smoker  . Smokeless tobacco: Never Used  . Alcohol use No     Allergies   Amlodipine and Penicillins   Review of Systems Review of Systems  Constitutional: Negative for chills, diaphoresis, fatigue and fever.  HENT: Negative for congestion, rhinorrhea and sneezing.   Eyes: Negative.   Respiratory: Negative for cough, chest tightness and shortness of breath.   Cardiovascular: Negative for chest pain and leg swelling.  Gastrointestinal: Negative for abdominal pain, blood in stool, diarrhea, nausea and vomiting.    Genitourinary: Negative for difficulty urinating, dysuria, flank pain, frequency and hematuria.  Musculoskeletal: Negative for arthralgias and back pain.  Skin: Negative for rash.  Neurological: Negative for dizziness, speech difficulty, weakness, numbness and headaches.     Physical Exam Updated Vital Signs BP 149/77   Pulse 99   Temp 99.1 F (37.3 C) (Oral)   Resp 18   SpO2 96%   Physical Exam  Constitutional: She is oriented to person, place, and time. She appears well-developed and well-nourished.  HENT:  Head: Normocephalic and atraumatic.  No erythema or exudates in the throat  Eyes: Pupils are equal, round, and reactive to light.  Neck: Normal range of motion. Neck supple.  Cardiovascular: Normal rate, regular rhythm and normal heart sounds.   Pulmonary/Chest: Effort normal and breath sounds normal. No respiratory distress. She has no wheezes. She has no rales. She exhibits no tenderness.  Abdominal: Soft. Bowel sounds are normal. There is no tenderness. There is no rebound and no guarding.  Musculoskeletal: Normal range of motion. She exhibits no edema.  No edema or calf tenderness in bilateral legs Normal sensation to both hands, no swelling Radial pulses intact Right arm is in shoulder sling  Lymphadenopathy:    She has no cervical adenopathy.  Neurological: She is alert and oriented to person, place, and time. She has normal strength. No cranial nerve deficit or sensory deficit.  Skin: Skin is warm and dry. No rash noted.  Psychiatric: She has a normal mood and affect.  Nursing note and vitals reviewed.    ED Treatments / Results  DIAGNOSTIC STUDIES: Oxygen Saturation is 96% on RA, normal by my interpretation.  COORDINATION OF CARE:  11:14 PM Discussed treatment plan which includes urinalysis with pt at bedside and pt agreed to plan.  Labs (all labs ordered are listed, but only abnormal results are displayed) Labs Reviewed  URINALYSIS, ROUTINE W REFLEX  MICROSCOPIC (NOT AT [facility]) - Abnormal; Notable for the following:       Result Value   APPearance CLOUDY (*)    All other components within normal limits  I-STAT CHEM 8, ED - Abnormal; Notable for the following:    BUN 29 (*)    Creatinine, Ser 1.30 (*)    Glucose, Bld 107 (*)    Calcium, Ion 1.14 (*)    Hemoglobin 9.2 (*)    HCT 27.0 (*)    All other components within normal limits    EKG  EKG Interpretation  Date/Time:  Friday November 19 2015 23:25:49 EDT Ventricular Rate:  95 PR Interval:    QRS Duration: 108 QT Interval:  400 QTC Calculation: 503 R Axis:   18 Text Interpretation:  Atrial flutter Borderline repolarization abnormality Prolonged QT interval changed from prior EKG, a-flutter has replaced sinus rhythm Confirmed by Emmanuela Ghazi  MD, Kymari Nuon ([id number]) on 11/19/2015 11:29:23 PM       Radiology No results found.  Procedures Procedures (including critical care time)  Medications Ordered in ED Medications - No data to display   Initial Impression / Assessment and Plan / ED Course  I have reviewed the triage vital signs and the nursing notes.  Pertinent labs & imaging results that were available during my care of the patient were reviewed by me and considered in my medical decision making (see chart for details).  Clinical Course   PT presents with vague symptoms of funny taste in her mouth and increased sleepiness.  I feel the taste in her mouth could be related to her recent intubation.  There are no signs of infection.  No URI symptoms.  Her increased sleepiness is likely related to the medications that she has been taking for her shoulder pain.  She has taken the percocet before, but not the baclofen and she took them together.  She is in a-fib, but has no cardiac symptoms.  No dizziness, CP, SOB, fluttering.  Rate is controlled.  She is not on anticoagulants due to a prior ICH.  She doesn't know if she normally goes in and out of a-fib.  She is on flecanide.  She has  not CVA symptoms, no speech deficits, vision changes, gait  problems, numbness or weakness to her extremities (other than intermittent tingling to fingertips (none now).  She is discharged in good condition.  I advised her to f/u with her PCP on Tuesday.  I also told her to call her cardiologist in [place] to advise that she in in a-fib.  Strict return precautions given.   Final Clinical Impressions(s) / ED Diagnoses   Final diagnoses:  Paroxysmal atrial fibrillation (HCC)  Medication reaction, initial encounter   I personally performed the services described in this documentation, which was scribed in my presence.  The recorded information has been reviewed and considered.   New Prescriptions New Prescriptions   No medications on file     Malvin Johns, MD 11/20/15 [phone]

## 2015-11-19 NOTE — ED Triage Notes (Signed)
The pt just feels strange since 0800am today.  She had shoulder surgery Tuesday.  Now she has had a strange taste in her mouth and she just does not feel right.  Pt alert and oriented skin warm and dry.    No pain anywhere  Maybe some sl pain rt shoulder

## 2015-11-20 LAB — URINALYSIS, ROUTINE W REFLEX MICROSCOPIC
BILIRUBIN URINE: NEGATIVE
Glucose, UA: NEGATIVE mg/dL
Hgb urine dipstick: NEGATIVE
KETONES UR: NEGATIVE mg/dL
LEUKOCYTES UA: NEGATIVE
NITRITE: NEGATIVE
Protein, ur: NEGATIVE mg/dL
SPECIFIC GRAVITY, URINE: 1.019 (ref 1.005–1.030)
pH: 5.5 (ref 5.0–8.0)

## 2015-11-26 DIAGNOSIS — M19011 Primary osteoarthritis, right shoulder: Secondary | ICD-10-CM | POA: Diagnosis not present

## 2015-12-09 DIAGNOSIS — Z6828 Body mass index (BMI) 28.0-28.9, adult: Secondary | ICD-10-CM | POA: Diagnosis not present

## 2015-12-09 DIAGNOSIS — M5137 Other intervertebral disc degeneration, lumbosacral region: Secondary | ICD-10-CM | POA: Diagnosis not present

## 2015-12-09 DIAGNOSIS — M5023 Other cervical disc displacement, cervicothoracic region: Secondary | ICD-10-CM | POA: Diagnosis not present

## 2015-12-09 DIAGNOSIS — I10 Essential (primary) hypertension: Secondary | ICD-10-CM | POA: Diagnosis not present

## 2015-12-21 DIAGNOSIS — Z23 Encounter for immunization: Secondary | ICD-10-CM | POA: Diagnosis not present

## 2015-12-27 DIAGNOSIS — M19011 Primary osteoarthritis, right shoulder: Secondary | ICD-10-CM | POA: Diagnosis not present

## 2016-01-04 DIAGNOSIS — M25511 Pain in right shoulder: Secondary | ICD-10-CM | POA: Diagnosis not present

## 2016-01-04 DIAGNOSIS — M6281 Muscle weakness (generalized): Secondary | ICD-10-CM | POA: Diagnosis not present

## 2016-01-04 DIAGNOSIS — M25611 Stiffness of right shoulder, not elsewhere classified: Secondary | ICD-10-CM | POA: Diagnosis not present

## 2016-01-06 DIAGNOSIS — M6281 Muscle weakness (generalized): Secondary | ICD-10-CM | POA: Diagnosis not present

## 2016-01-06 DIAGNOSIS — M25611 Stiffness of right shoulder, not elsewhere classified: Secondary | ICD-10-CM | POA: Diagnosis not present

## 2016-01-06 DIAGNOSIS — M25511 Pain in right shoulder: Secondary | ICD-10-CM | POA: Diagnosis not present

## 2016-01-11 DIAGNOSIS — M25611 Stiffness of right shoulder, not elsewhere classified: Secondary | ICD-10-CM | POA: Diagnosis not present

## 2016-01-11 DIAGNOSIS — M6281 Muscle weakness (generalized): Secondary | ICD-10-CM | POA: Diagnosis not present

## 2016-01-11 DIAGNOSIS — M25511 Pain in right shoulder: Secondary | ICD-10-CM | POA: Diagnosis not present

## 2016-01-13 DIAGNOSIS — M25511 Pain in right shoulder: Secondary | ICD-10-CM | POA: Diagnosis not present

## 2016-01-13 DIAGNOSIS — M6281 Muscle weakness (generalized): Secondary | ICD-10-CM | POA: Diagnosis not present

## 2016-01-13 DIAGNOSIS — M25611 Stiffness of right shoulder, not elsewhere classified: Secondary | ICD-10-CM | POA: Diagnosis not present

## 2016-01-14 DIAGNOSIS — I480 Paroxysmal atrial fibrillation: Secondary | ICD-10-CM | POA: Diagnosis not present

## 2016-01-14 DIAGNOSIS — I10 Essential (primary) hypertension: Secondary | ICD-10-CM | POA: Diagnosis not present

## 2016-01-14 DIAGNOSIS — Z950 Presence of cardiac pacemaker: Secondary | ICD-10-CM | POA: Diagnosis not present

## 2016-01-18 DIAGNOSIS — M6281 Muscle weakness (generalized): Secondary | ICD-10-CM | POA: Diagnosis not present

## 2016-01-18 DIAGNOSIS — M25611 Stiffness of right shoulder, not elsewhere classified: Secondary | ICD-10-CM | POA: Diagnosis not present

## 2016-01-18 DIAGNOSIS — M25511 Pain in right shoulder: Secondary | ICD-10-CM | POA: Diagnosis not present

## 2016-01-20 DIAGNOSIS — M6281 Muscle weakness (generalized): Secondary | ICD-10-CM | POA: Diagnosis not present

## 2016-01-20 DIAGNOSIS — M25511 Pain in right shoulder: Secondary | ICD-10-CM | POA: Diagnosis not present

## 2016-01-20 DIAGNOSIS — M25611 Stiffness of right shoulder, not elsewhere classified: Secondary | ICD-10-CM | POA: Diagnosis not present

## 2016-01-24 DIAGNOSIS — M75112 Incomplete rotator cuff tear or rupture of left shoulder, not specified as traumatic: Secondary | ICD-10-CM | POA: Diagnosis not present

## 2016-01-24 DIAGNOSIS — M25561 Pain in right knee: Secondary | ICD-10-CM | POA: Diagnosis not present

## 2016-01-25 DIAGNOSIS — M25611 Stiffness of right shoulder, not elsewhere classified: Secondary | ICD-10-CM | POA: Diagnosis not present

## 2016-01-25 DIAGNOSIS — M25511 Pain in right shoulder: Secondary | ICD-10-CM | POA: Diagnosis not present

## 2016-01-25 DIAGNOSIS — M6281 Muscle weakness (generalized): Secondary | ICD-10-CM | POA: Diagnosis not present

## 2016-01-27 DIAGNOSIS — M6281 Muscle weakness (generalized): Secondary | ICD-10-CM | POA: Diagnosis not present

## 2016-01-27 DIAGNOSIS — M25611 Stiffness of right shoulder, not elsewhere classified: Secondary | ICD-10-CM | POA: Diagnosis not present

## 2016-01-27 DIAGNOSIS — M25511 Pain in right shoulder: Secondary | ICD-10-CM | POA: Diagnosis not present

## 2016-02-01 DIAGNOSIS — M6281 Muscle weakness (generalized): Secondary | ICD-10-CM | POA: Diagnosis not present

## 2016-02-01 DIAGNOSIS — M25611 Stiffness of right shoulder, not elsewhere classified: Secondary | ICD-10-CM | POA: Diagnosis not present

## 2016-02-01 DIAGNOSIS — M25511 Pain in right shoulder: Secondary | ICD-10-CM | POA: Diagnosis not present

## 2016-02-03 DIAGNOSIS — M25611 Stiffness of right shoulder, not elsewhere classified: Secondary | ICD-10-CM | POA: Diagnosis not present

## 2016-02-03 DIAGNOSIS — M6281 Muscle weakness (generalized): Secondary | ICD-10-CM | POA: Diagnosis not present

## 2016-02-03 DIAGNOSIS — M25511 Pain in right shoulder: Secondary | ICD-10-CM | POA: Diagnosis not present

## 2016-02-07 DIAGNOSIS — I10 Essential (primary) hypertension: Secondary | ICD-10-CM | POA: Diagnosis not present

## 2016-02-07 DIAGNOSIS — I480 Paroxysmal atrial fibrillation: Secondary | ICD-10-CM | POA: Diagnosis not present

## 2016-02-07 DIAGNOSIS — Z950 Presence of cardiac pacemaker: Secondary | ICD-10-CM | POA: Diagnosis not present

## 2016-02-08 DIAGNOSIS — M6281 Muscle weakness (generalized): Secondary | ICD-10-CM | POA: Diagnosis not present

## 2016-02-08 DIAGNOSIS — M25511 Pain in right shoulder: Secondary | ICD-10-CM | POA: Diagnosis not present

## 2016-02-08 DIAGNOSIS — M25611 Stiffness of right shoulder, not elsewhere classified: Secondary | ICD-10-CM | POA: Diagnosis not present

## 2016-02-29 DIAGNOSIS — M5023 Other cervical disc displacement, cervicothoracic region: Secondary | ICD-10-CM | POA: Diagnosis not present

## 2016-02-29 DIAGNOSIS — M5137 Other intervertebral disc degeneration, lumbosacral region: Secondary | ICD-10-CM | POA: Diagnosis not present

## 2016-03-06 DIAGNOSIS — M25561 Pain in right knee: Secondary | ICD-10-CM | POA: Diagnosis not present

## 2016-04-12 DIAGNOSIS — E785 Hyperlipidemia, unspecified: Secondary | ICD-10-CM | POA: Diagnosis not present

## 2016-04-12 DIAGNOSIS — D519 Vitamin B12 deficiency anemia, unspecified: Secondary | ICD-10-CM | POA: Diagnosis not present

## 2016-04-12 DIAGNOSIS — Z6830 Body mass index (BMI) 30.0-30.9, adult: Secondary | ICD-10-CM | POA: Diagnosis not present

## 2016-04-12 DIAGNOSIS — E559 Vitamin D deficiency, unspecified: Secondary | ICD-10-CM | POA: Diagnosis not present

## 2016-04-12 DIAGNOSIS — D638 Anemia in other chronic diseases classified elsewhere: Secondary | ICD-10-CM | POA: Diagnosis not present

## 2016-04-12 DIAGNOSIS — I480 Paroxysmal atrial fibrillation: Secondary | ICD-10-CM | POA: Diagnosis not present

## 2016-04-12 DIAGNOSIS — I10 Essential (primary) hypertension: Secondary | ICD-10-CM | POA: Diagnosis not present

## 2016-04-17 DIAGNOSIS — Z950 Presence of cardiac pacemaker: Secondary | ICD-10-CM | POA: Diagnosis not present

## 2016-04-18 DIAGNOSIS — M5137 Other intervertebral disc degeneration, lumbosacral region: Secondary | ICD-10-CM | POA: Diagnosis not present

## 2016-04-19 ENCOUNTER — Encounter (HOSPITAL_COMMUNITY): Payer: Self-pay

## 2016-04-19 ENCOUNTER — Emergency Department (HOSPITAL_COMMUNITY)
Admission: EM | Admit: 2016-04-19 | Discharge: 2016-04-19 | Disposition: A | Discharge status: Home / Self Care | Payer: Medicare Other | Attending: Emergency Medicine | Admitting: Emergency Medicine

## 2016-04-19 ENCOUNTER — Emergency Department (HOSPITAL_COMMUNITY): Payer: Medicare Other

## 2016-04-19 DIAGNOSIS — Z7982 Long term (current) use of aspirin: Secondary | ICD-10-CM | POA: Insufficient documentation

## 2016-04-19 DIAGNOSIS — Z950 Presence of cardiac pacemaker: Secondary | ICD-10-CM | POA: Insufficient documentation

## 2016-04-19 DIAGNOSIS — I10 Essential (primary) hypertension: Secondary | ICD-10-CM | POA: Insufficient documentation

## 2016-04-19 DIAGNOSIS — Z96651 Presence of right artificial knee joint: Secondary | ICD-10-CM | POA: Diagnosis not present

## 2016-04-19 DIAGNOSIS — M25511 Pain in right shoulder: Secondary | ICD-10-CM | POA: Insufficient documentation

## 2016-04-19 DIAGNOSIS — Z79899 Other long term (current) drug therapy: Secondary | ICD-10-CM | POA: Diagnosis not present

## 2016-04-19 LAB — CBC WITH DIFFERENTIAL/PLATELET
Basophils Absolute: 0 10*3/uL (ref 0.0–0.1)
Basophils Relative: 0 %
EOS PCT: 1 %
Eosinophils Absolute: 0.1 10*3/uL (ref 0.0–0.7)
HEMATOCRIT: 30.7 % — AB (ref 36.0–46.0)
Hemoglobin: 9.7 g/dL — ABNORMAL LOW (ref 12.0–15.0)
LYMPHS ABS: 1.1 10*3/uL (ref 0.7–4.0)
LYMPHS PCT: 21 %
MCH: 31.6 pg (ref 26.0–34.0)
MCHC: 31.6 g/dL (ref 30.0–36.0)
MCV: 100 fL (ref 78.0–100.0)
Monocytes Absolute: 0.4 10*3/uL (ref 0.1–1.0)
Monocytes Relative: 7 %
NEUTROS ABS: 3.6 10*3/uL (ref 1.7–7.7)
Neutrophils Relative %: 71 %
PLATELETS: 236 10*3/uL (ref 150–400)
RBC: 3.07 MIL/uL — ABNORMAL LOW (ref 3.87–5.11)
RDW: 15 % (ref 11.5–15.5)
WBC: 5.1 10*3/uL (ref 4.0–10.5)

## 2016-04-19 LAB — COMPREHENSIVE METABOLIC PANEL
ALK PHOS: 41 U/L (ref 38–126)
ALT: 17 U/L (ref 14–54)
AST: 26 U/L (ref 15–41)
Albumin: 3.6 g/dL (ref 3.5–5.0)
Anion gap: 7 (ref 5–15)
BILIRUBIN TOTAL: 0.6 mg/dL (ref 0.3–1.2)
BUN: 19 mg/dL (ref 6–20)
CALCIUM: 8.8 mg/dL — AB (ref 8.9–10.3)
CHLORIDE: 107 mmol/L (ref 101–111)
CO2: 24 mmol/L (ref 22–32)
CREATININE: 1.17 mg/dL — AB (ref 0.44–1.00)
GFR, EST AFRICAN AMERICAN: 49 mL/min — AB (ref >60)
GFR, EST NON AFRICAN AMERICAN: 42 mL/min — AB (ref >60)
Glucose, Bld: 99 mg/dL (ref 65–99)
Potassium: 4.6 mmol/L (ref 3.5–5.1)
Sodium: 138 mmol/L (ref 135–145)
TOTAL PROTEIN: 6 g/dL — AB (ref 6.5–8.1)

## 2016-04-19 LAB — I-STAT TROPONIN, ED: TROPONIN I, POC: 0 ng/mL (ref 0.00–0.08)

## 2016-04-19 MED ORDER — HYDROCHLOROTHIAZIDE 25 MG PO TABS
25.0000 mg | ORAL_TABLET | Freq: Every day | ORAL | Status: DC
  Administered 2016-04-19: 25 mg via ORAL
  Filled 2016-04-19: qty 1

## 2016-04-19 MED ORDER — CYCLOBENZAPRINE HCL 5 MG PO TABS: 5.0000 mg | ORAL_TABLET | Freq: Three times a day (TID) | ORAL | 0 refills | Status: DC | PRN

## 2016-04-19 MED ORDER — MORPHINE SULFATE (PF) 4 MG/ML IV SOLN
6.0000 mg | Freq: Once | INTRAVENOUS | Status: AC
  Administered 2016-04-19: 6 mg via INTRAVENOUS
  Filled 2016-04-19: qty 2

## 2016-04-19 MED ORDER — DIAZEPAM 5 MG/ML IJ SOLN
5.0000 mg | Freq: Once | INTRAMUSCULAR | Status: AC
  Administered 2016-04-19: 5 mg via INTRAVENOUS
  Filled 2016-04-19: qty 2

## 2016-04-19 NOTE — ED Provider Notes (Signed)
[name]   CSN: [id number] Arrival date & time: 04/19/16  1728   By signing my name below, I, [name], attest that this documentation has been prepared under the direction and in the presence of [name], MD. Electronically Signed: Soijett [name], ED Scribe. 04/19/16. 7:35 PM.  History   Chief Complaint Chief Complaint  Patient presents with  . Shoulder Pain    HPI [name] is a 83 y.o. female with a PMHx of HTN, arthritis, pacemaker, who presents to the Emergency Department complaining of right shoulder pain onset this morning. Pt reports that she has been sleeping on her right side recently due to a hematoma to her left hip. Pt notes that she had a total right shoulder replacement completed by Dr. Mardelle Matte in November 16, 2015. Pt right shoulder pain is worsened with movement. Pt states that she has had 5 previous back surgeries with hardware placed. Pt has tried her daily 10-325 mg percocet with no relief of her symptoms. She denies recent injury, recent trauma, CP, and any other symptoms. Pt states that she takes daily HCTZ and valsartan for her HTN.     The history is provided by the patient. No language interpreter was used.    Past Medical History:  Diagnosis Date  . A-fib ([place])   . Anemia   . Arthritis    osteoarthritis - back and knee  . Blood transfusion   . Cataract    bilateral  . Chronic back pain   . History of stomach ulcers   . Hypertension   . Pacemaker   . Rotator cuff tear arthropathy of right shoulder 11/16/2015  . Status post lumbar surgery     Patient Active Problem List   Diagnosis Date Noted  . Rotator cuff tear arthropathy of right shoulder 11/16/2015  . S/P shoulder replacement 11/16/2015  . Clostridium difficile diarrhea 04/11/2011    Past Surgical History:  Procedure Laterality Date  . ABDOMINAL HYSTERECTOMY    . BACK SURGERY     five back surgeries  . CHOLECYSTECTOMY    . COLONOSCOPY    .  CRANIOTOMY     ? subdural hematoma?  Sustained head injury after falling while taking Coumadin  . ESOPHAGOGASTRODUODENOSCOPY    . EYE SURGERY Bilateral    cataract surgery with lens implant  . FRACTURE SURGERY     left wrist, with plate and removal of plate  . INSERT / REPLACE / REMOVE PACEMAKER    . KNEE ARTHROPLASTY     right knee  . REVERSE SHOULDER ARTHROPLASTY Right 11/16/2015  . REVERSE SHOULDER ARTHROPLASTY Right 11/16/2015   Procedure: REVERSE SHOULDER ARTHROPLASTY;  Surgeon: Marchia Bond, MD;  Location: [facility];  Service: Orthopedics;  Laterality: Right;  . SHOULDER OPEN ROTATOR CUFF REPAIR  right    OB History    No data available       Home Medications    Prior to Admission medications   Medication Sig Start Date End Date Taking? Authorizing Provider  alendronate (FOSAMAX) 70 MG tablet Take 70 mg by mouth once a week. Take with a full glass of water on an empty stomach.    Historical Provider, MD  ALPRAZolam Duanne Moron) 0.25 MG tablet Take 0.25 mg by mouth at bedtime as needed for anxiety.    Historical Provider, MD  aspirin EC 81 MG tablet Take 81 mg by mouth at bedtime.      Historical Provider, MD  baclofen (LIORESAL) 10 MG tablet  Take 1 tablet (10 mg total) by mouth 3 (three) times daily. As needed for muscle spasm 11/16/15   Marchia Bond, MD  Cholecalciferol (VITAMIN D3 PO) Take 1 tablet by mouth daily.    Historical Provider, MD  co-enzyme Q-10 30 MG capsule Take 30 mg by mouth daily.    Historical Provider, MD  cyanocobalamin ([employer] VITAMIN B12) 2000 MCG tablet Take 2,000 mcg by mouth daily.    Historical Provider, MD  Flaxseed, Linseed, (FLAXSEED OIL) 1000 MG CAPS Take 1,000 mg by mouth daily.    Historical Provider, MD  flecainide (TAMBOCOR) 100 MG tablet Take 50-100 mg by mouth 2 (two) times daily. Take 1/2 tab in the am & 1 tab in the pm    Historical Provider, MD  Glucosamine HCl (GLUCOSAMINE PO) Take 1 tablet by mouth daily.    Historical Provider, MD    hydrochlorothiazide (HYDRODIURIL) 25 MG tablet Take 25 mg by mouth daily.    Historical Provider, MD  Kelp 100 MG TABS Take 100 mg by mouth daily.    Historical Provider, MD  Multiple Vitamin (MULTIVITAMIN) tablet Take 1 tablet by mouth daily.    Historical Provider, MD  omeprazole (PRILOSEC) 20 MG capsule Take 20 mg by mouth daily.    Historical Provider, MD  ondansetron (ZOFRAN) 4 MG tablet Take 1 tablet (4 mg total) by mouth every 8 (eight) hours as needed for nausea or vomiting. 11/16/15   Marchia Bond, MD  oxyCODONE-acetaminophen (PERCOCET) 10-325 MG tablet Take 1-2 tablets by mouth every 4 (four) hours as needed for pain. For pain 11/16/15   Marchia Bond, MD  sennosides-docusate sodium (SENOKOT-S) 8.6-50 MG tablet Take 2 tablets by mouth daily. 11/16/15   Marchia Bond, MD  valsartan (DIOVAN) 320 MG tablet Take 320 mg by mouth daily.    Historical Provider, MD    Family History Family History  Problem Relation Age of Onset  . Uterine cancer Mother   . Diabetes Mother   . Alcoholism Father   . Diabetes Brother   . Throat cancer Brother   . Diabetes Brother   . Diabetes Brother     Social History Social History  Substance Use Topics  . Smoking status: Never Smoker  . Smokeless tobacco: Never Used  . Alcohol use No     Allergies   Amlodipine and Penicillins   Review of Systems Review of Systems  Musculoskeletal: Positive for arthralgias (right shoulder).  All other systems reviewed and are negative.    Physical Exam Updated Vital Signs BP 154/72   Pulse 78   Temp 98.7 F (37.1 C) (Oral)   Resp 16   SpO2 96%   Physical Exam  Constitutional: She is oriented to person, place, and time. She appears well-developed and well-nourished. No distress.  HENT:  Head: Normocephalic and atraumatic.  Eyes: EOM are normal.  Neck: Neck supple.  Cardiovascular: Normal rate, regular rhythm and normal heart sounds.  Exam reveals no gallop and no friction rub.   No murmur  heard. Pulmonary/Chest: Effort normal and breath sounds normal. No respiratory distress. She has no wheezes. She has no rales.  Abdominal: Soft. She exhibits no distension. There is no tenderness.  Musculoskeletal: Normal range of motion.       Right shoulder: She exhibits tenderness. She exhibits no deformity.  Right shoulder with previous shoulder replacement. Mild tenderness around shoulder. No obvious deformity. Healing scars from previous back surgeries. No midline tenderness.   Neurological: She is alert and oriented to person,  place, and time.  Skin: Skin is warm and dry.  Psychiatric: She has a normal mood and affect. Her behavior is normal.  Nursing note and vitals reviewed.    ED Treatments / Results  DIAGNOSTIC STUDIES: Oxygen Saturation is 100% on RA, nl by my interpretation.    COORDINATION OF CARE: 7:31 PM Discussed treatment plan with pt at bedside which includes right shoulder xray, labs, and pt agreed to plan.   Labs (all labs ordered are listed, but only abnormal results are displayed) Labs Reviewed  CBC WITH DIFFERENTIAL/PLATELET - Abnormal; Notable for the following:       Result Value   RBC 3.07 (*)    Hemoglobin 9.7 (*)    HCT 30.7 (*)    All other components within normal limits  COMPREHENSIVE METABOLIC PANEL - Abnormal; Notable for the following:    Creatinine, Ser 1.17 (*)    Calcium 8.8 (*)    Total Protein 6.0 (*)    GFR calc non Af Amer 42 (*)    GFR calc Af Amer 49 (*)    All other components within normal limits  Randolm Idol, ED    Radiology Dg Shoulder Right  Result Date: 04/19/2016 CLINICAL DATA:  Sudden onset right shoulder pain. No known injury. Previous shoulder arthroplasty. EXAM: RIGHT SHOULDER - 2+ VIEW COMPARISON:  11/16/2015 FINDINGS: Right shoulder prosthesis remains in appropriate position. No evidence of fracture or dislocation. No evidence hardware failure or loosening. No other osseous abnormality identified. IMPRESSION:  Right shoulder prostheses appropriate position. No evidence of fracture or other acute findings. Electronically Signed   By: Earle Gell M.D.   On: 04/19/2016 19:13    Procedures Procedures (including critical care time)  Medications Ordered in ED Medications  hydrochlorothiazide (HYDRODIURIL) tablet 25 mg (25 mg Oral Given 04/19/16 2038)  morphine 4 MG/ML injection 6 mg (6 mg Intravenous Given 04/19/16 2038)  diazepam (VALIUM) injection 5 mg (5 mg Intravenous Given 04/19/16 2038)     Initial Impression / Assessment and Plan / ED Course  I have reviewed the triage vital signs and the nursing notes.  Pertinent labs & imaging results that were available during my care of the patient were reviewed by me and considered in my medical decision making (see chart for details).    ADDELYNN BAYES is a 83 y.o. female here with R shoulder pain. Had previous shoulder replacement but there is no trauma. Patient hypertensive on arrival but has hx of HTN. Denies chest pain or abdominal pain. She is neurovasular intact RUE. I think likely muscle spasms vs hardware problems. Will get basic labs, xrays shoulder. Will give pain meds, muscle relaxants.   9:05 PM BP down to 150 from 180. Labs unremarkable. xrays showed no fractures and hardware in place. Pain controlled with morphine and valium. She has percocet at home. Will add flexeril and encourage her to call her orthopedic doctor for follow up    Final Clinical Impressions(s) / ED Diagnoses   Final diagnoses:  None    New Prescriptions New Prescriptions   No medications on file   I personally performed the services described in this documentation, which was scribed in my presence. The recorded information has been reviewed and is accurate.    [name], MD 04/19/16 2105

## 2016-04-19 NOTE — ED Triage Notes (Signed)
Pt states R total shoulder replacement Aug 2017. Pt complaining of shoulder pain today. Pt deneis any injury/trauma. Pt denies any falls. No new drainage or swelling. Pt states no pain at rest, increased pain with motion.

## 2016-04-19 NOTE — Discharge Instructions (Signed)
Call dr. Luanna Cole office tomorrow for appointment.   Continue taking percocet for pain.   Take flexeril for muscle spasms.   See Dr. Mardelle Matte  Return to ER if you have severe pain, unable to move the shoulder, chest pain, abdominal pain

## 2016-04-20 DIAGNOSIS — M6281 Muscle weakness (generalized): Secondary | ICD-10-CM | POA: Diagnosis not present

## 2016-04-20 DIAGNOSIS — M5442 Lumbago with sciatica, left side: Secondary | ICD-10-CM | POA: Diagnosis not present

## 2016-04-20 DIAGNOSIS — M545 Low back pain: Secondary | ICD-10-CM | POA: Diagnosis not present

## 2016-04-20 DIAGNOSIS — M25552 Pain in left hip: Secondary | ICD-10-CM | POA: Diagnosis not present

## 2016-04-24 DIAGNOSIS — M75112 Incomplete rotator cuff tear or rupture of left shoulder, not specified as traumatic: Secondary | ICD-10-CM | POA: Diagnosis not present

## 2016-04-25 DIAGNOSIS — M545 Low back pain: Secondary | ICD-10-CM | POA: Diagnosis not present

## 2016-04-25 DIAGNOSIS — M25552 Pain in left hip: Secondary | ICD-10-CM | POA: Diagnosis not present

## 2016-04-25 DIAGNOSIS — M5442 Lumbago with sciatica, left side: Secondary | ICD-10-CM | POA: Diagnosis not present

## 2016-04-25 DIAGNOSIS — M6281 Muscle weakness (generalized): Secondary | ICD-10-CM | POA: Diagnosis not present

## 2016-04-27 DIAGNOSIS — M545 Low back pain: Secondary | ICD-10-CM | POA: Diagnosis not present

## 2016-04-27 DIAGNOSIS — M5442 Lumbago with sciatica, left side: Secondary | ICD-10-CM | POA: Diagnosis not present

## 2016-04-27 DIAGNOSIS — M25552 Pain in left hip: Secondary | ICD-10-CM | POA: Diagnosis not present

## 2016-04-27 DIAGNOSIS — M6281 Muscle weakness (generalized): Secondary | ICD-10-CM | POA: Diagnosis not present

## 2016-05-02 DIAGNOSIS — M25552 Pain in left hip: Secondary | ICD-10-CM | POA: Diagnosis not present

## 2016-05-02 DIAGNOSIS — M5442 Lumbago with sciatica, left side: Secondary | ICD-10-CM | POA: Diagnosis not present

## 2016-05-02 DIAGNOSIS — M6281 Muscle weakness (generalized): Secondary | ICD-10-CM | POA: Diagnosis not present

## 2016-05-02 DIAGNOSIS — M545 Low back pain: Secondary | ICD-10-CM | POA: Diagnosis not present

## 2016-05-04 DIAGNOSIS — M5442 Lumbago with sciatica, left side: Secondary | ICD-10-CM | POA: Diagnosis not present

## 2016-05-04 DIAGNOSIS — M545 Low back pain: Secondary | ICD-10-CM | POA: Diagnosis not present

## 2016-05-04 DIAGNOSIS — M25552 Pain in left hip: Secondary | ICD-10-CM | POA: Diagnosis not present

## 2016-05-04 DIAGNOSIS — M6281 Muscle weakness (generalized): Secondary | ICD-10-CM | POA: Diagnosis not present

## 2016-05-05 DIAGNOSIS — M19011 Primary osteoarthritis, right shoulder: Secondary | ICD-10-CM | POA: Diagnosis not present

## 2016-05-05 DIAGNOSIS — M25511 Pain in right shoulder: Secondary | ICD-10-CM | POA: Diagnosis not present

## 2016-05-09 DIAGNOSIS — M545 Low back pain: Secondary | ICD-10-CM | POA: Diagnosis not present

## 2016-05-09 DIAGNOSIS — M6281 Muscle weakness (generalized): Secondary | ICD-10-CM | POA: Diagnosis not present

## 2016-05-09 DIAGNOSIS — M25552 Pain in left hip: Secondary | ICD-10-CM | POA: Diagnosis not present

## 2016-05-09 DIAGNOSIS — M5442 Lumbago with sciatica, left side: Secondary | ICD-10-CM | POA: Diagnosis not present

## 2016-05-11 DIAGNOSIS — M6281 Muscle weakness (generalized): Secondary | ICD-10-CM | POA: Diagnosis not present

## 2016-05-11 DIAGNOSIS — M5442 Lumbago with sciatica, left side: Secondary | ICD-10-CM | POA: Diagnosis not present

## 2016-05-11 DIAGNOSIS — M545 Low back pain: Secondary | ICD-10-CM | POA: Diagnosis not present

## 2016-05-11 DIAGNOSIS — M25552 Pain in left hip: Secondary | ICD-10-CM | POA: Diagnosis not present

## 2016-05-18 DIAGNOSIS — M25552 Pain in left hip: Secondary | ICD-10-CM | POA: Diagnosis not present

## 2016-05-18 DIAGNOSIS — M545 Low back pain: Secondary | ICD-10-CM | POA: Diagnosis not present

## 2016-05-18 DIAGNOSIS — M5442 Lumbago with sciatica, left side: Secondary | ICD-10-CM | POA: Diagnosis not present

## 2016-05-18 DIAGNOSIS — M6281 Muscle weakness (generalized): Secondary | ICD-10-CM | POA: Diagnosis not present

## 2016-05-22 DIAGNOSIS — M19011 Primary osteoarthritis, right shoulder: Secondary | ICD-10-CM | POA: Diagnosis not present

## 2016-05-25 DIAGNOSIS — I480 Paroxysmal atrial fibrillation: Secondary | ICD-10-CM | POA: Diagnosis not present

## 2016-05-25 DIAGNOSIS — Z45010 Encounter for checking and testing of cardiac pacemaker pulse generator [battery]: Secondary | ICD-10-CM | POA: Diagnosis not present

## 2016-05-25 DIAGNOSIS — Z950 Presence of cardiac pacemaker: Secondary | ICD-10-CM | POA: Diagnosis not present

## 2016-06-05 DIAGNOSIS — I10 Essential (primary) hypertension: Secondary | ICD-10-CM | POA: Diagnosis not present

## 2016-06-05 DIAGNOSIS — M5416 Radiculopathy, lumbar region: Secondary | ICD-10-CM | POA: Diagnosis not present

## 2016-06-05 DIAGNOSIS — Z6828 Body mass index (BMI) 28.0-28.9, adult: Secondary | ICD-10-CM | POA: Diagnosis not present

## 2016-07-25 DIAGNOSIS — Z6829 Body mass index (BMI) 29.0-29.9, adult: Secondary | ICD-10-CM | POA: Diagnosis not present

## 2016-07-25 DIAGNOSIS — D519 Vitamin B12 deficiency anemia, unspecified: Secondary | ICD-10-CM | POA: Diagnosis not present

## 2016-07-25 DIAGNOSIS — E559 Vitamin D deficiency, unspecified: Secondary | ICD-10-CM | POA: Diagnosis not present

## 2016-07-25 DIAGNOSIS — R531 Weakness: Secondary | ICD-10-CM | POA: Diagnosis not present

## 2016-07-25 DIAGNOSIS — I480 Paroxysmal atrial fibrillation: Secondary | ICD-10-CM | POA: Diagnosis not present

## 2016-07-25 DIAGNOSIS — D638 Anemia in other chronic diseases classified elsewhere: Secondary | ICD-10-CM | POA: Diagnosis not present

## 2016-08-03 DIAGNOSIS — Z0000 Encounter for general adult medical examination without abnormal findings: Secondary | ICD-10-CM | POA: Diagnosis not present

## 2016-08-03 DIAGNOSIS — Z6830 Body mass index (BMI) 30.0-30.9, adult: Secondary | ICD-10-CM | POA: Diagnosis not present

## 2016-08-03 DIAGNOSIS — E785 Hyperlipidemia, unspecified: Secondary | ICD-10-CM | POA: Diagnosis not present

## 2016-08-03 DIAGNOSIS — Z1389 Encounter for screening for other disorder: Secondary | ICD-10-CM | POA: Diagnosis not present

## 2016-08-03 DIAGNOSIS — Z9181 History of falling: Secondary | ICD-10-CM | POA: Diagnosis not present

## 2016-08-03 DIAGNOSIS — N959 Unspecified menopausal and perimenopausal disorder: Secondary | ICD-10-CM | POA: Diagnosis not present

## 2016-08-03 DIAGNOSIS — Z136 Encounter for screening for cardiovascular disorders: Secondary | ICD-10-CM | POA: Diagnosis not present

## 2016-08-07 DIAGNOSIS — I10 Essential (primary) hypertension: Secondary | ICD-10-CM | POA: Diagnosis not present

## 2016-08-07 DIAGNOSIS — Z950 Presence of cardiac pacemaker: Secondary | ICD-10-CM | POA: Diagnosis not present

## 2016-08-07 DIAGNOSIS — I480 Paroxysmal atrial fibrillation: Secondary | ICD-10-CM | POA: Diagnosis not present

## 2016-08-17 DIAGNOSIS — M85831 Other specified disorders of bone density and structure, right forearm: Secondary | ICD-10-CM | POA: Diagnosis not present

## 2016-08-17 DIAGNOSIS — N959 Unspecified menopausal and perimenopausal disorder: Secondary | ICD-10-CM | POA: Diagnosis not present

## 2016-08-17 DIAGNOSIS — M8588 Other specified disorders of bone density and structure, other site: Secondary | ICD-10-CM | POA: Diagnosis not present

## 2016-08-23 DIAGNOSIS — M19011 Primary osteoarthritis, right shoulder: Secondary | ICD-10-CM | POA: Diagnosis not present

## 2016-08-25 DIAGNOSIS — Z950 Presence of cardiac pacemaker: Secondary | ICD-10-CM | POA: Diagnosis not present

## 2016-09-05 DIAGNOSIS — M4807 Spinal stenosis, lumbosacral region: Secondary | ICD-10-CM | POA: Diagnosis not present

## 2016-09-05 DIAGNOSIS — M4120 Other idiopathic scoliosis, site unspecified: Secondary | ICD-10-CM | POA: Diagnosis not present

## 2016-09-05 DIAGNOSIS — M542 Cervicalgia: Secondary | ICD-10-CM | POA: Diagnosis not present

## 2016-10-11 DIAGNOSIS — Z23 Encounter for immunization: Secondary | ICD-10-CM | POA: Diagnosis not present

## 2016-10-11 DIAGNOSIS — D638 Anemia in other chronic diseases classified elsewhere: Secondary | ICD-10-CM | POA: Diagnosis not present

## 2016-10-11 DIAGNOSIS — E785 Hyperlipidemia, unspecified: Secondary | ICD-10-CM | POA: Diagnosis not present

## 2016-10-11 DIAGNOSIS — E559 Vitamin D deficiency, unspecified: Secondary | ICD-10-CM | POA: Diagnosis not present

## 2016-10-11 DIAGNOSIS — M159 Polyosteoarthritis, unspecified: Secondary | ICD-10-CM | POA: Diagnosis not present

## 2016-10-11 DIAGNOSIS — I480 Paroxysmal atrial fibrillation: Secondary | ICD-10-CM | POA: Diagnosis not present

## 2016-10-11 DIAGNOSIS — D519 Vitamin B12 deficiency anemia, unspecified: Secondary | ICD-10-CM | POA: Diagnosis not present

## 2016-10-11 DIAGNOSIS — I10 Essential (primary) hypertension: Secondary | ICD-10-CM | POA: Diagnosis not present

## 2016-10-11 DIAGNOSIS — Z6829 Body mass index (BMI) 29.0-29.9, adult: Secondary | ICD-10-CM | POA: Diagnosis not present

## 2016-11-10 DIAGNOSIS — Z23 Encounter for immunization: Secondary | ICD-10-CM | POA: Diagnosis not present

## 2016-12-01 DIAGNOSIS — I10 Essential (primary) hypertension: Secondary | ICD-10-CM | POA: Diagnosis not present

## 2016-12-01 DIAGNOSIS — Z6829 Body mass index (BMI) 29.0-29.9, adult: Secondary | ICD-10-CM | POA: Diagnosis not present

## 2016-12-05 DIAGNOSIS — I10 Essential (primary) hypertension: Secondary | ICD-10-CM | POA: Diagnosis not present

## 2016-12-05 DIAGNOSIS — Z6828 Body mass index (BMI) 28.0-28.9, adult: Secondary | ICD-10-CM | POA: Diagnosis not present

## 2016-12-08 DIAGNOSIS — Z950 Presence of cardiac pacemaker: Secondary | ICD-10-CM | POA: Diagnosis not present

## 2016-12-18 DIAGNOSIS — M778 Other enthesopathies, not elsewhere classified: Secondary | ICD-10-CM | POA: Diagnosis not present

## 2016-12-18 DIAGNOSIS — M19011 Primary osteoarthritis, right shoulder: Secondary | ICD-10-CM | POA: Diagnosis not present

## 2017-01-16 DIAGNOSIS — Z6830 Body mass index (BMI) 30.0-30.9, adult: Secondary | ICD-10-CM | POA: Diagnosis not present

## 2017-01-16 DIAGNOSIS — K2900 Acute gastritis without bleeding: Secondary | ICD-10-CM | POA: Diagnosis not present

## 2017-02-12 ENCOUNTER — Encounter: Payer: Self-pay | Admitting: Cardiology

## 2017-02-12 ENCOUNTER — Ambulatory Visit (INDEPENDENT_AMBULATORY_CARE_PROVIDER_SITE_OTHER): Payer: Medicare Other | Admitting: Cardiology

## 2017-02-12 VITALS — BP 140/82 | HR 78 | Ht 63.0 in | Wt 155.1 lb

## 2017-02-12 DIAGNOSIS — I480 Paroxysmal atrial fibrillation: Secondary | ICD-10-CM | POA: Diagnosis not present

## 2017-02-12 DIAGNOSIS — Z950 Presence of cardiac pacemaker: Secondary | ICD-10-CM

## 2017-02-12 DIAGNOSIS — I10 Essential (primary) hypertension: Secondary | ICD-10-CM

## 2017-02-12 NOTE — Patient Instructions (Signed)
Medication Instructions:  Your physician recommends that you continue on your current medications as directed. Please refer to the Current Medication list given to you today.  Labwork: None   Testing/Procedures: EKG today in office.   Your physician has requested that you have an echocardiogram. Echocardiography is a painless test that uses sound waves to create images of your heart. It provides your doctor with information about the size and shape of your heart and how well your heart's chambers and valves are working. This procedure takes approximately one hour. There are no restrictions for this procedure.  Your physician has requested that you have a carotid duplex. This test is an ultrasound of the carotid arteries in your neck. It looks at blood flow through these arteries that supply the brain with blood. Allow one hour for this exam. There are no restrictions or special instructions.  Follow-Up: Your physician wants you to follow-up in: 7 months. You will receive a reminder letter in the mail two months in advance. If you don't receive a letter, please call our office to schedule the follow-up appointment.  Any Other Special Instructions Will Be Listed Below (If Applicable).  Please note that any paperwork needing to be filled out by the provider will need to be addressed at the front desk prior to seeing the provider. Please note that any paperwork FMLA, Disability or other documents regarding health condition is subject to a $25.00 charge that must be received prior to completion of paperwork in the form of a money order or check.     If you need a refill on your cardiac medications before your next appointment, please call your pharmacy.

## 2017-02-12 NOTE — Progress Notes (Signed)
Cardiology Office Note:    Date:  02/12/2017   ID:  Kaitlyn, [name] 1931/09/09, MRN [id number]  PCP:  Nicoletta Dress, MD  Cardiologist:  Jenne Campus, MD    Referring MD: Nicoletta Dress, MD   Chief Complaint  Patient presents with  . Follow-up    6 month follow up. No concerns; had a fall on 11/16   Doing well  History of Present Illness:    [name] is a 83 y.o. female with paroxysmal atrial fibrillation, essential hypertension, pacemaker.  She comes to my office for regular follow-up.  She does fine but a few weeks ago she fell down she was walking on holding the storm door at her porch she lost her balance and falling down.  She said she did not passed out.  He does have paroxysmal atrial fibrillation but denies having any palpitations.  Her pacemaker interrogated in the summer was functioning properly she is scheduled to have interrogation in December within the next few days.  I offered her to transfer services to our pacemaker clinic but she wants to wait until we will be back in the hospital.  Denies having any chest pain tightness squeezing pressure burning chest but complained of having some exertional shortness of breath.  Past Medical History:  Diagnosis Date  . A-fib ([place])   . Anemia   . Aortic valve disorder   . Arthritis    osteoarthritis - back and knee  . Benign hypertensive heart disease without heart failure   . Blood transfusion   . Cataract    bilateral  . Chronic back pain   . History of stomach ulcers   . Hyperlipidemia   . Hypertension   . Pacemaker   . Rotator cuff tear arthropathy of right shoulder 11/16/2015  . Status post lumbar surgery     Past Surgical History:  Procedure Laterality Date  . ABDOMINAL HYSTERECTOMY    . BACK SURGERY     five back surgeries  . CHOLECYSTECTOMY    . COLONOSCOPY    . CRANIOTOMY     ? subdural hematoma?  Sustained head injury after falling while taking Coumadin  .  ESOPHAGOGASTRODUODENOSCOPY    . EYE SURGERY Bilateral    cataract surgery with lens implant  . FRACTURE SURGERY     left wrist, with plate and removal of plate  . INSERT / REPLACE / REMOVE PACEMAKER     Medtronic  . KNEE ARTHROPLASTY     right knee  . REVERSE SHOULDER ARTHROPLASTY Right 11/16/2015  . REVERSE SHOULDER ARTHROPLASTY Right 11/16/2015   Procedure: REVERSE SHOULDER ARTHROPLASTY;  Surgeon: Marchia Bond, MD;  Location: [facility];  Service: Orthopedics;  Laterality: Right;  . SHOULDER OPEN ROTATOR CUFF REPAIR  right    Current Medications: Current Meds  Medication Sig  . alendronate (FOSAMAX) 70 MG tablet Take 70 mg by mouth once a week. Take with a full glass of water on an empty stomach.  . ALPRAZolam (XANAX) 0.25 MG tablet Take 0.25 mg by mouth at bedtime as needed for anxiety.  Marland Kitchen aspirin EC 81 MG tablet Take 81 mg by mouth at bedtime.    . Calcium Carb-Cholecalciferol (CALCIUM-VITAMIN D) 500-200 MG-UNIT tablet Take 1 tablet 2 (two) times daily by mouth.  . Coenzyme Q10 100 MG capsule Take 100 mg daily by mouth.   . cyanocobalamin ([employer]S VITAMIN B12) 2000 MCG tablet Take 2,000 mcg by mouth daily.  . flecainide (TAMBOCOR) 100 MG tablet Take  50-100 mg by mouth 2 (two) times daily. Take 1/2 tab in the am & 1 tab in the pm  . gabapentin (NEURONTIN) 100 MG capsule Take by mouth.  . hydrochlorothiazide (HYDRODIURIL) 25 MG tablet Take 25 mg by mouth daily.  Marland Kitchen Kelp 100 MG TABS Take 100 mg by mouth daily.  . Multiple Vitamin (MULTIVITAMIN) tablet Take 1 tablet by mouth daily.  . Multiple Vitamins-Minerals (MULTIVITAMIN WITH MINERALS) tablet Take 1 tablet daily by mouth.  . oxyCODONE-acetaminophen (PERCOCET) 10-325 MG tablet Take 1-2 tablets by mouth every 4 (four) hours as needed for pain. For pain  . sennosides-docusate sodium (SENOKOT-S) 8.6-50 MG tablet Take 2 tablets by mouth daily.     Allergies:   Amlodipine; Levofloxacin; and Penicillins   Social History   Socioeconomic  History  . Marital status: Widowed    Spouse name: None  . Number of children: None  . Years of education: None  . Highest education level: None  Social Needs  . Financial resource strain: None  . Food insecurity - worry: None  . Food insecurity - inability: None  . Transportation needs - medical: None  . Transportation needs - non-medical: None  Occupational History  . None  Tobacco Use  . Smoking status: Never Smoker  . Smokeless tobacco: Never Used  Substance and Sexual Activity  . Alcohol use: No  . Drug use: No  . Sexual activity: None  Other Topics Concern  . None  Social History Narrative  . None     Family History: The patient's family history includes Alcoholism in her father; Diabetes in her brother, brother, brother, and mother; Throat cancer in her brother; Uterine cancer in her mother. ROS:   Please see the history of present illness.    All 14 point review of systems negative except as described per history of present illness  EKGs/Labs/Other Studies Reviewed:      Recent Labs: 04/19/2016: ALT 17; BUN 19; Creatinine, Ser 1.17; Hemoglobin 9.7; Platelets 236; Potassium 4.6; Sodium 138  Recent Lipid Panel No results found for: CHOL, TRIG, HDL, CHOLHDL, VLDL, LDLCALC, LDLDIRECT  Physical Exam:    VS:  BP 140/82 (BP Location: Right Arm, Patient Position: Sitting, Cuff Size: Small)   Pulse 78   Ht 5\' 3"  (1.6 m)   Wt 155 lb 1.3 oz (70.3 kg)   BMI 27.47 kg/m     Wt Readings from Last 3 Encounters:  02/12/17 155 lb 1.3 oz (70.3 kg)  11/16/15 155 lb 11.2 oz (70.6 kg)  11/05/15 155 lb 11.2 oz (70.6 kg)     GEN:  Well nourished, well developed in no acute distress HEENT: Normal NECK: No JVD; No carotid bruits LYMPHATICS: No lymphadenopathy CARDIAC: RRR, no murmurs, no rubs, no gallops RESPIRATORY:  Clear to auscultation without rales, wheezing or rhonchi  ABDOMEN: Soft, non-tender, non-distended MUSCULOSKELETAL:  No edema; No deformity  SKIN: Warm and  dry LOWER EXTREMITIES: no swelling NEUROLOGIC:  Alert and oriented x 3 PSYCHIATRIC:  Normal affect   ASSESSMENT:    1. Essential hypertension   2. Paroxysmal atrial fibrillation (HCC)   3. Pacemaker    PLAN:    In order of problems listed above:  1. Essential hypertension: Blood pressure well controlled continue present management. 2. Paroxysmal atrial fibrillation: She is not anticoagulated in spite of chads 2 Vascor equals 4, she did have intracranial bleed while taking Coumadin.  On top of that recent falls.  She is taking flecainide which I will continue EKG  will be done to check QT interval.  Pacemaker will be interrogated to check if there are any evidence of paroxysmal atrial fibrillation. 3. Pacemaker: She wants to have it interrogated by [facility] until we come back to [place] 4. Dyslipidemia: We will call primary care physician for fasting lipid profile 5. He does have palpating mass on the right side of her neck I suspect this is tortuous carotid artery but ultrasounds of her neck will be done to clarify this.  I will also ask you to have echocardiogram to check left ventricular ejection fraction as well as EKG to check for QT interval   Medication Adjustments/Labs and Tests Ordered: Current medicines are reviewed at length with the patient today.  Concerns regarding medicines are outlined above.  No orders of the defined types were placed in this encounter.  Medication changes: No orders of the defined types were placed in this encounter.   Signed, Park Liter, MD, [facility] 02/12/2017 10:57 AM    [place]

## 2017-02-26 DIAGNOSIS — Z950 Presence of cardiac pacemaker: Secondary | ICD-10-CM | POA: Diagnosis not present

## 2017-03-02 ENCOUNTER — Ambulatory Visit (HOSPITAL_BASED_OUTPATIENT_CLINIC_OR_DEPARTMENT_OTHER)
Admission: RE | Admit: 2017-03-02 | Discharge: 2017-03-02 | Disposition: A | Discharge status: Home / Self Care | Payer: Medicare Other | Source: Ambulatory Visit | Attending: Cardiology | Admitting: Cardiology

## 2017-03-02 DIAGNOSIS — I10 Essential (primary) hypertension: Secondary | ICD-10-CM

## 2017-03-02 DIAGNOSIS — I080 Rheumatic disorders of both mitral and aortic valves: Secondary | ICD-10-CM | POA: Diagnosis not present

## 2017-03-02 DIAGNOSIS — I480 Paroxysmal atrial fibrillation: Secondary | ICD-10-CM | POA: Diagnosis not present

## 2017-03-02 DIAGNOSIS — Z950 Presence of cardiac pacemaker: Secondary | ICD-10-CM | POA: Diagnosis not present

## 2017-03-02 LAB — VAS US CAROTID
LCCAPSYS: -54 cm/s
LEFT ECA DIAS: -6 cm/s
LEFT VERTEBRAL DIAS: -12 cm/s
LICADSYS: -79 cm/s
Left CCA dist dias: -11 cm/s
Left CCA dist sys: -50 cm/s
Left CCA prox dias: -11 cm/s
Left ICA dist dias: -23 cm/s
Left ICA prox dias: -17 cm/s
Left ICA prox sys: -50 cm/s
RCCADSYS: -61 cm/s
RCCAPDIAS: -10 cm/s
RIGHT ECA DIAS: 8 cm/s
RIGHT VERTEBRAL DIAS: -17 cm/s
Right CCA prox sys: -48 cm/s

## 2017-03-02 NOTE — Progress Notes (Signed)
Tech performed carotid duplex. Bilateral calcification and intimal thickening.

## 2017-03-02 NOTE — Progress Notes (Signed)
Echocardiogram 2D Echocardiogram has been performed.  [name] 03/02/2017, 2:15 PM

## 2017-03-06 DIAGNOSIS — M545 Low back pain: Secondary | ICD-10-CM | POA: Diagnosis not present

## 2017-03-08 DIAGNOSIS — Z961 Presence of intraocular lens: Secondary | ICD-10-CM | POA: Diagnosis not present

## 2017-03-23 DIAGNOSIS — M19011 Primary osteoarthritis, right shoulder: Secondary | ICD-10-CM | POA: Diagnosis not present

## 2017-03-23 DIAGNOSIS — M1711 Unilateral primary osteoarthritis, right knee: Secondary | ICD-10-CM | POA: Diagnosis not present

## 2017-04-03 DIAGNOSIS — M5023 Other cervical disc displacement, cervicothoracic region: Secondary | ICD-10-CM | POA: Diagnosis not present

## 2017-04-03 DIAGNOSIS — M4120 Other idiopathic scoliosis, site unspecified: Secondary | ICD-10-CM | POA: Diagnosis not present

## 2017-04-05 ENCOUNTER — Other Ambulatory Visit: Payer: Self-pay | Admitting: Neurosurgery

## 2017-04-05 DIAGNOSIS — M4120 Other idiopathic scoliosis, site unspecified: Secondary | ICD-10-CM

## 2017-04-11 ENCOUNTER — Other Ambulatory Visit: Payer: 59

## 2017-04-18 DIAGNOSIS — Z6830 Body mass index (BMI) 30.0-30.9, adult: Secondary | ICD-10-CM | POA: Diagnosis not present

## 2017-04-18 DIAGNOSIS — D519 Vitamin B12 deficiency anemia, unspecified: Secondary | ICD-10-CM | POA: Diagnosis not present

## 2017-04-18 DIAGNOSIS — I10 Essential (primary) hypertension: Secondary | ICD-10-CM | POA: Diagnosis not present

## 2017-04-18 DIAGNOSIS — D638 Anemia in other chronic diseases classified elsewhere: Secondary | ICD-10-CM | POA: Diagnosis not present

## 2017-04-18 DIAGNOSIS — E785 Hyperlipidemia, unspecified: Secondary | ICD-10-CM | POA: Diagnosis not present

## 2017-04-18 DIAGNOSIS — E559 Vitamin D deficiency, unspecified: Secondary | ICD-10-CM | POA: Diagnosis not present

## 2017-04-18 DIAGNOSIS — I480 Paroxysmal atrial fibrillation: Secondary | ICD-10-CM | POA: Diagnosis not present

## 2017-04-25 DIAGNOSIS — I10 Essential (primary) hypertension: Secondary | ICD-10-CM | POA: Diagnosis not present

## 2017-04-30 DIAGNOSIS — M1711 Unilateral primary osteoarthritis, right knee: Secondary | ICD-10-CM | POA: Diagnosis not present

## 2017-04-30 DIAGNOSIS — M25511 Pain in right shoulder: Secondary | ICD-10-CM | POA: Diagnosis not present

## 2017-05-04 DIAGNOSIS — Z6831 Body mass index (BMI) 31.0-31.9, adult: Secondary | ICD-10-CM | POA: Diagnosis not present

## 2017-05-04 DIAGNOSIS — K649 Unspecified hemorrhoids: Secondary | ICD-10-CM | POA: Diagnosis not present

## 2017-05-04 DIAGNOSIS — I10 Essential (primary) hypertension: Secondary | ICD-10-CM | POA: Diagnosis not present

## 2017-06-05 ENCOUNTER — Ambulatory Visit
Admission: RE | Admit: 2017-06-05 | Discharge: 2017-06-05 | Disposition: A | Discharge status: Home / Self Care | Payer: Medicare Other | Source: Ambulatory Visit | Attending: Neurosurgery | Admitting: Neurosurgery

## 2017-06-05 DIAGNOSIS — M4120 Other idiopathic scoliosis, site unspecified: Secondary | ICD-10-CM

## 2017-06-05 DIAGNOSIS — M4324 Fusion of spine, thoracic region: Secondary | ICD-10-CM | POA: Diagnosis not present

## 2017-06-05 DIAGNOSIS — M4326 Fusion of spine, lumbar region: Secondary | ICD-10-CM | POA: Diagnosis not present

## 2017-06-07 DIAGNOSIS — M5023 Other cervical disc displacement, cervicothoracic region: Secondary | ICD-10-CM | POA: Diagnosis not present

## 2017-07-12 DIAGNOSIS — I482 Chronic atrial fibrillation, unspecified: Secondary | ICD-10-CM

## 2017-07-12 HISTORY — DX: Chronic atrial fibrillation, unspecified: I48.20

## 2017-08-06 DIAGNOSIS — Z136 Encounter for screening for cardiovascular disorders: Secondary | ICD-10-CM | POA: Diagnosis not present

## 2017-08-06 DIAGNOSIS — Z1231 Encounter for screening mammogram for malignant neoplasm of breast: Secondary | ICD-10-CM | POA: Diagnosis not present

## 2017-08-06 DIAGNOSIS — Z1331 Encounter for screening for depression: Secondary | ICD-10-CM | POA: Diagnosis not present

## 2017-08-06 DIAGNOSIS — Z0000 Encounter for general adult medical examination without abnormal findings: Secondary | ICD-10-CM | POA: Diagnosis not present

## 2017-08-06 DIAGNOSIS — Z6830 Body mass index (BMI) 30.0-30.9, adult: Secondary | ICD-10-CM | POA: Diagnosis not present

## 2017-08-06 DIAGNOSIS — Z9181 History of falling: Secondary | ICD-10-CM | POA: Diagnosis not present

## 2017-08-06 DIAGNOSIS — E785 Hyperlipidemia, unspecified: Secondary | ICD-10-CM | POA: Diagnosis not present

## 2017-08-23 DIAGNOSIS — R0981 Nasal congestion: Secondary | ICD-10-CM | POA: Diagnosis not present

## 2017-08-23 DIAGNOSIS — R42 Dizziness and giddiness: Secondary | ICD-10-CM | POA: Diagnosis not present

## 2017-09-13 DIAGNOSIS — M4120 Other idiopathic scoliosis, site unspecified: Secondary | ICD-10-CM | POA: Diagnosis not present

## 2017-09-13 DIAGNOSIS — M5023 Other cervical disc displacement, cervicothoracic region: Secondary | ICD-10-CM | POA: Diagnosis not present

## 2017-09-13 DIAGNOSIS — G8929 Other chronic pain: Secondary | ICD-10-CM | POA: Diagnosis not present

## 2017-09-17 ENCOUNTER — Ambulatory Visit (INDEPENDENT_AMBULATORY_CARE_PROVIDER_SITE_OTHER): Payer: Medicare Other | Admitting: Cardiology

## 2017-09-17 ENCOUNTER — Encounter: Payer: Self-pay | Admitting: Cardiology

## 2017-09-17 VITALS — BP 132/68 | HR 70 | Ht 63.0 in | Wt 146.0 lb

## 2017-09-17 DIAGNOSIS — Z950 Presence of cardiac pacemaker: Secondary | ICD-10-CM | POA: Diagnosis not present

## 2017-09-17 DIAGNOSIS — I420 Dilated cardiomyopathy: Secondary | ICD-10-CM

## 2017-09-17 DIAGNOSIS — I10 Essential (primary) hypertension: Secondary | ICD-10-CM | POA: Diagnosis not present

## 2017-09-17 DIAGNOSIS — I480 Paroxysmal atrial fibrillation: Secondary | ICD-10-CM

## 2017-09-17 HISTORY — DX: Dilated cardiomyopathy: I42.0

## 2017-09-17 NOTE — Progress Notes (Signed)
Cardiology Office Note:    Date:  09/17/2017   ID:  Kaitlyn, [name] 1931/06/09, MRN [id number]  PCP:  Kaitlyn Dress, MD  Cardiologist:  Kaitlyn Campus, MD    Referring MD: Kaitlyn Dress, MD   No chief complaint on file. Doing well  History of Present Illness:    [name] is a 84 y.o. female with paroxysmal what appears to be right now persistent atrial fibrillation.  History of cardiomyopathy with diminished ejection fraction.  When we realized that she does have diminished left ventricular ejection fraction asked her to discontinue flecainide however she stopped it for about a week started feeling poorly and went back on it.  Now she is taking flecainide.  I told her this is an appropriate because of her cardiomyopathy on top of that she appears to be in atrial fibrillation today.  I will ask her to interrogate device and if device show me persistent atrial fibrillation will determine this is chronic atrial fibrillation I will stop her flecainide.  I told her to stop it anyway because of her cardiomyopathy.  She denies having the chest pain tightness squeezing pressure burning chest no palpitations no dizziness.  In the future I will put her on beta-blocker however I do not want to do 2 things at once I will bring her back to my office in about 1 month to revisit association at that time hopefully will be able to start on beta-blocker  Past Medical History:  Diagnosis Date  . A-fib ([place])   . Anemia   . Aortic valve disorder   . Arthritis    osteoarthritis - back and knee  . Benign hypertensive heart disease without heart failure   . Blood transfusion   . Cataract    bilateral  . Chronic back pain   . History of stomach ulcers   . Hyperlipidemia   . Hypertension   . Pacemaker   . Rotator cuff tear arthropathy of right shoulder 11/16/2015  . Status post lumbar surgery     Past Surgical History:  Procedure Laterality Date  . ABDOMINAL HYSTERECTOMY    .  BACK SURGERY     five back surgeries  . CHOLECYSTECTOMY    . COLONOSCOPY    . CRANIOTOMY     ? subdural hematoma?  Sustained head injury after falling while taking Coumadin  . ESOPHAGOGASTRODUODENOSCOPY    . EYE SURGERY Bilateral    cataract surgery with lens implant  . FRACTURE SURGERY     left wrist, with plate and removal of plate  . INSERT / REPLACE / REMOVE PACEMAKER     Medtronic  . KNEE ARTHROPLASTY     right knee  . REVERSE SHOULDER ARTHROPLASTY Right 11/16/2015  . REVERSE SHOULDER ARTHROPLASTY Right 11/16/2015   Procedure: REVERSE SHOULDER ARTHROPLASTY;  Surgeon: Marchia Bond, MD;  Location: [facility];  Service: Orthopedics;  Laterality: Right;  . SHOULDER OPEN ROTATOR CUFF REPAIR  right    Current Medications: Current Meds  Medication Sig  . alendronate (FOSAMAX) 70 MG tablet Take 70 mg by mouth once a week. Take with a full glass of water on an empty stomach.  . ALPRAZolam (XANAX) 0.25 MG tablet Take 0.25 mg by mouth at bedtime as needed for anxiety.  Marland Kitchen aspirin EC 81 MG tablet Take 81 mg by mouth at bedtime.    . Calcium Carb-Cholecalciferol (CALCIUM-VITAMIN D) 500-200 MG-UNIT tablet Take 1 tablet 2 (two) times daily by mouth.  . Coenzyme Q10  100 MG capsule Take 100 mg daily by mouth.   . cyanocobalamin (CVS VITAMIN B12) 2000 MCG tablet Take 2,000 mcg by mouth daily.  . famotidine (PEPCID) 40 MG tablet Take 40 mg by mouth daily.  . flecainide (TAMBOCOR) 100 MG tablet Take 50-100 mg by mouth 2 (two) times daily. Take 1/2 tab in the am & 1 tab in the pm  . gabapentin (NEURONTIN) 100 MG capsule Take 100 mg by mouth daily.   . hydrochlorothiazide (HYDRODIURIL) 25 MG tablet Take 25 mg by mouth daily.  Marland Kitchen Kelp 100 MG TABS Take 100 mg by mouth daily.  . Multiple Vitamin (MULTIVITAMIN) tablet Take 1 tablet by mouth daily.  . Multiple Vitamins-Minerals (MULTIVITAMIN WITH MINERALS) tablet Take 1 tablet daily by mouth.  . oxyCODONE-acetaminophen (PERCOCET) 10-325 MG tablet Take 1-2  tablets by mouth every 4 (four) hours as needed for pain. For pain  . pantoprazole (PROTONIX) 40 MG tablet Take 40 mg by mouth daily.  . sennosides-docusate sodium (SENOKOT-S) 8.6-50 MG tablet Take 2 tablets by mouth daily. (Patient taking differently: Take 2 tablets by mouth as needed. )  . valsartan (DIOVAN) 320 MG tablet Take 1 tablet by mouth daily.     Allergies:   Amlodipine; Levofloxacin; and Penicillins   Social History   Socioeconomic History  . Marital status: Widowed    Spouse name: Not on file  . Number of children: Not on file  . Years of education: Not on file  . Highest education level: Not on file  Occupational History  . Not on file  Social Needs  . Financial resource strain: Not on file  . Food insecurity:    Worry: Not on file    Inability: Not on file  . Transportation needs:    Medical: Not on file    Non-medical: Not on file  Tobacco Use  . Smoking status: Never Smoker  . Smokeless tobacco: Never Used  Substance and Sexual Activity  . Alcohol use: No  . Drug use: No  . Sexual activity: Not on file  Lifestyle  . Physical activity:    Days per week: Not on file    Minutes per session: Not on file  . Stress: Not on file  Relationships  . Social connections:    Talks on phone: Not on file    Gets together: Not on file    Attends religious service: Not on file    Active member of club or organization: Not on file    Attends meetings of clubs or organizations: Not on file    Relationship status: Not on file  Other Topics Concern  . Not on file  Social History Narrative  . Not on file     Family History: The patient's family history includes Alcoholism in her father; Diabetes in her brother, brother, brother, and mother; Throat cancer in her brother; Uterine cancer in her mother. ROS:   Please see the history of present illness.    All 14 point review of systems negative except as described per history of present illness  EKGs/Labs/Other  Studies Reviewed:    EKG today showed atrial fibrillation paced rhythm.  Recent Labs: No results found for requested labs within last 8760 hours.  Recent Lipid Panel No results found for: CHOL, TRIG, HDL, CHOLHDL, VLDL, LDLCALC, LDLDIRECT  Physical Exam:    VS:  BP 132/68 (BP Location: Right Arm, Patient Position: Sitting, Cuff Size: Normal)   Pulse 70   Ht 5\' 3"  (1.6 m)  Wt 146 lb (66.2 kg)   SpO2 98%   BMI 25.86 kg/m     Wt Readings from Last 3 Encounters:  09/17/17 146 lb (66.2 kg)  02/12/17 155 lb 1.3 oz (70.3 kg)  11/16/15 155 lb 11.2 oz (70.6 kg)     GEN:  Well nourished, well developed in no acute distress HEENT: Normal NECK: No JVD; No carotid bruits LYMPHATICS: No lymphadenopathy CARDIAC: RRR, no murmurs, no rubs, no gallops RESPIRATORY:  Clear to auscultation without rales, wheezing or rhonchi  ABDOMEN: Soft, non-tender, non-distended MUSCULOSKELETAL:  No edema; No deformity  SKIN: Warm and dry LOWER EXTREMITIES: no swelling NEUROLOGIC:  Alert and oriented x 3 PSYCHIATRIC:  Normal affect   ASSESSMENT:    1. Paroxysmal atrial fibrillation (HCC)   2. Essential hypertension   3. Pacemaker   4. Dilated cardiomyopathy ([place])    PLAN:    In order of problems listed above:  1. Paroxysmal atrial fibrillation appears to be persistent.  Pacemaker will be interrogated today to see how much atrial fibrillation she has. 2. Essential hypertension blood pressure appears to be well controlled continue present management. 3. Pacemaker is a Medtronic device was interrogated today and the purpose of interrogation will be to check amount of atrial fibrillation she has 4. Dilated cardiomyopathy with diminished ejection fraction she is on ARB which I will continue my intention will be to add beta-blocker in form of carvedilol but first I want to stop flecainide and make sure she is doing well.  Previously when she stopped flecainide she felt poorly therefore I do not want to  do 2 things at once.  Overall she is doing well.  Flecainide will put on appropriate medication for cardiomyopathy recheck in few months.   Medication Adjustments/Labs and Tests Ordered: Current medicines are reviewed at length with the patient today.  Concerns regarding medicines are outlined above.  No orders of the defined types were placed in this encounter.  Medication changes: No orders of the defined types were placed in this encounter.   Signed, Park Liter, MD, [facility] 09/17/2017 10:18 AM    [place]

## 2017-09-17 NOTE — Patient Instructions (Signed)
Medication Instructions:  Your physician has recommended you make the following change in your medication:  STOP flecainide  Labwork: None  Testing/Procedures: None  Follow-Up: Your physician recommends that you schedule a follow-up appointment in: 1 month  Any Other Special Instructions Will Be Listed Below (If Applicable).     If you need a refill on your cardiac medications before your next appointment, please call your pharmacy.   [place], RN, BSN

## 2017-09-26 DIAGNOSIS — Z950 Presence of cardiac pacemaker: Secondary | ICD-10-CM | POA: Diagnosis not present

## 2017-09-26 DIAGNOSIS — Z45010 Encounter for checking and testing of cardiac pacemaker pulse generator [battery]: Secondary | ICD-10-CM | POA: Diagnosis not present

## 2017-10-02 ENCOUNTER — Telehealth: Payer: Self-pay | Admitting: *Deleted

## 2017-10-02 NOTE — Telephone Encounter (Signed)
Attempted to call patient x 1 - N/A  Patient needs f/u w/[facility] in [facility]

## 2017-10-16 DIAGNOSIS — I480 Paroxysmal atrial fibrillation: Secondary | ICD-10-CM | POA: Diagnosis not present

## 2017-10-16 DIAGNOSIS — I10 Essential (primary) hypertension: Secondary | ICD-10-CM | POA: Diagnosis not present

## 2017-10-16 DIAGNOSIS — E669 Obesity, unspecified: Secondary | ICD-10-CM | POA: Diagnosis not present

## 2017-10-16 DIAGNOSIS — D638 Anemia in other chronic diseases classified elsewhere: Secondary | ICD-10-CM | POA: Diagnosis not present

## 2017-10-16 DIAGNOSIS — E559 Vitamin D deficiency, unspecified: Secondary | ICD-10-CM | POA: Diagnosis not present

## 2017-10-24 NOTE — Telephone Encounter (Signed)
LVM/sss 

## 2017-10-24 NOTE — Telephone Encounter (Signed)
Pt returned [facility] phone call. She needs a f\u appointment.

## 2017-10-24 NOTE — Telephone Encounter (Signed)
Spoke with pt regarding f/u with WC in [place] informed her that someone would be calling to schedule the apt, pt stated to leave a message on her voicemail if she wasn't at home

## 2017-10-25 ENCOUNTER — Encounter: Payer: Self-pay | Admitting: Cardiology

## 2017-10-25 ENCOUNTER — Ambulatory Visit (INDEPENDENT_AMBULATORY_CARE_PROVIDER_SITE_OTHER): Payer: Medicare Other | Admitting: Cardiology

## 2017-10-25 VITALS — BP 128/74 | HR 64 | Ht 63.0 in | Wt 157.4 lb

## 2017-10-25 DIAGNOSIS — I10 Essential (primary) hypertension: Secondary | ICD-10-CM

## 2017-10-25 DIAGNOSIS — Z950 Presence of cardiac pacemaker: Secondary | ICD-10-CM | POA: Diagnosis not present

## 2017-10-25 DIAGNOSIS — I420 Dilated cardiomyopathy: Secondary | ICD-10-CM

## 2017-10-25 DIAGNOSIS — I482 Chronic atrial fibrillation, unspecified: Secondary | ICD-10-CM

## 2017-10-25 NOTE — Progress Notes (Signed)
Cardiology Office Note:    Date:  10/25/2017   ID:  Kaitlyn, [name] 23-Nov-1931, MRN [id number]  PCP:  Kaitlyn Dress, MD  Cardiologist:  Kaitlyn Campus, MD    Referring MD: Kaitlyn Dress, MD   Chief Complaint  Patient presents with  . Follow-up    1 month follow up.  I am upset  History of Present Illness:    [name] is a 84 y.o. female with what now appears to be chronic atrial fibrillation, pacemaker, essential hypertension also dilated cardiomyopathy she is very upset during the office visit today I asked her last time to stop flecainide because of the fact that she had persistent atrial fibrillation now chronic atrial fibrillation as well as because of cardia myopathy 4 months she stopped the medication she felt very poorly she felt her heart flip-flopping and running away she decided to go back on the medication and is spent Gradle of time talking to her today about reasons for this medication not to continue she still insist on going on the medication.  I will ask her to have repeated echocardiogram to recheck left ventricular ejection fraction will also make an arrangements for her pacemaker interrogation to see how much atrial fibrillation she has latest interrogation showed persistent now chronic atrial fibrillation therefore in my opinion it is upsetting normal for this medication.  In the future we may discontinue flecainide and put her on some AV blocking agent which will be more appropriate for her clinical scenario  Past Medical History:  Diagnosis Date  . A-fib ([place])   . Anemia   . Aortic valve disorder   . Arthritis    osteoarthritis - back and knee  . Benign hypertensive heart disease without heart failure   . Blood transfusion   . Cataract    bilateral  . Chronic back pain   . History of stomach ulcers   . Hyperlipidemia   . Hypertension   . Pacemaker   . Rotator cuff tear arthropathy of right shoulder 11/16/2015  . Status post lumbar  surgery     Past Surgical History:  Procedure Laterality Date  . ABDOMINAL HYSTERECTOMY    . BACK SURGERY     five back surgeries  . CHOLECYSTECTOMY    . COLONOSCOPY    . CRANIOTOMY     ? subdural hematoma?  Sustained head injury after falling while taking Coumadin  . ESOPHAGOGASTRODUODENOSCOPY    . EYE SURGERY Bilateral    cataract surgery with lens implant  . FRACTURE SURGERY     left wrist, with plate and removal of plate  . INSERT / REPLACE / REMOVE PACEMAKER     Medtronic  . KNEE ARTHROPLASTY     right knee  . REVERSE SHOULDER ARTHROPLASTY Right 11/16/2015  . REVERSE SHOULDER ARTHROPLASTY Right 11/16/2015   Procedure: REVERSE SHOULDER ARTHROPLASTY;  Surgeon: Marchia Bond, MD;  Location: [place];  Service: Orthopedics;  Laterality: Right;  . SHOULDER OPEN ROTATOR CUFF REPAIR  right    Current Medications: Current Meds  Medication Sig  . alendronate (FOSAMAX) 70 MG tablet Take 70 mg by mouth once a week. Take with a full glass of water on an empty stomach.  . ALPRAZolam (XANAX) 0.25 MG tablet Take 0.25 mg by mouth at bedtime as needed for anxiety.  Marland Kitchen aspirin EC 81 MG tablet Take 81 mg by mouth at bedtime.    . Calcium Carb-Cholecalciferol (CALCIUM-VITAMIN D) 500-200 MG-UNIT tablet Take 1 tablet 2 (two)  times daily by mouth.  . Coenzyme Q10 100 MG capsule Take 100 mg daily by mouth.   . cyanocobalamin ([employer] VITAMIN B12) 2000 MCG tablet Take 2,000 mcg by mouth daily.  . famotidine (PEPCID) 40 MG tablet Take 40 mg by mouth daily.  Marland Kitchen gabapentin (NEURONTIN) 100 MG capsule Take 100 mg by mouth daily.   . hydrochlorothiazide (HYDRODIURIL) 25 MG tablet Take 25 mg by mouth daily.  Marland Kitchen Kelp 100 MG TABS Take 100 mg by mouth daily.  . Multiple Vitamin (MULTIVITAMIN) tablet Take 1 tablet by mouth daily.  . Multiple Vitamins-Minerals (MULTIVITAMIN WITH MINERALS) tablet Take 1 tablet daily by mouth.  . oxyCODONE-acetaminophen (PERCOCET) 10-325 MG tablet Take 1-2 tablets by mouth every 4  (four) hours as needed for pain. For pain  . pantoprazole (PROTONIX) 40 MG tablet Take 40 mg by mouth daily.  . sennosides-docusate sodium (SENOKOT-S) 8.6-50 MG tablet Take 2 tablets by mouth daily. (Patient taking differently: Take 2 tablets by mouth as needed. )  . valsartan (DIOVAN) 320 MG tablet Take 1 tablet by mouth daily.     Allergies:   Amlodipine; Levofloxacin; and Penicillins   Social History   Socioeconomic History  . Marital status: Widowed    Spouse name: Not on file  . Number of children: Not on file  . Years of education: Not on file  . Highest education level: Not on file  Occupational History  . Not on file  Social Needs  . Financial resource strain: Not on file  . Food insecurity:    Worry: Not on file    Inability: Not on file  . Transportation needs:    Medical: Not on file    Non-medical: Not on file  Tobacco Use  . Smoking status: Never Smoker  . Smokeless tobacco: Never Used  Substance and Sexual Activity  . Alcohol use: No  . Drug use: No  . Sexual activity: Not on file  Lifestyle  . Physical activity:    Days per week: Not on file    Minutes per session: Not on file  . Stress: Not on file  Relationships  . Social connections:    Talks on phone: Not on file    Gets together: Not on file    Attends religious service: Not on file    Active member of club or organization: Not on file    Attends meetings of clubs or organizations: Not on file    Relationship status: Not on file  Other Topics Concern  . Not on file  Social History Narrative  . Not on file     Family History: The patient's family history includes Alcoholism in her father; Diabetes in her brother, brother, brother, and mother; Throat cancer in her brother; Uterine cancer in her mother. ROS:   Please see the history of present illness.    All 14 point review of systems negative except as described per history of present illness  EKGs/Labs/Other Studies Reviewed:       Recent Labs: No results found for requested labs within last 8760 hours.  Recent Lipid Panel No results found for: CHOL, TRIG, HDL, CHOLHDL, VLDL, LDLCALC, LDLDIRECT  Physical Exam:    VS:  BP 128/74 (BP Location: Left Arm, Patient Position: Sitting, Cuff Size: Large)   Pulse 64   Ht 5\' 3"  (1.6 m)   Wt 157 lb 6.4 oz (71.4 kg)   SpO2 98%   BMI 27.88 kg/m     Wt Readings from Last  3 Encounters:  10/25/17 157 lb 6.4 oz (71.4 kg)  09/17/17 146 lb (66.2 kg)  02/12/17 155 lb 1.3 oz (70.3 kg)     GEN:  Well nourished, well developed in no acute distress HEENT: Normal NECK: No JVD; No carotid bruits LYMPHATICS: No lymphadenopathy CARDIAC: RRR, no murmurs, no rubs, no gallops RESPIRATORY:  Clear to auscultation without rales, wheezing or rhonchi  ABDOMEN: Soft, non-tender, non-distended MUSCULOSKELETAL:  No edema; No deformity  SKIN: Warm and dry LOWER EXTREMITIES: no swelling NEUROLOGIC:  Alert and oriented x 3 PSYCHIATRIC:  Normal affect   ASSESSMENT:    1. Chronic atrial fibrillation ([place])   2. Dilated cardiomyopathy ([place])   3. Essential hypertension   4. Pacemaker    PLAN:    In order of problems listed above:  1. Chronic atrial fibrillation discussion as above.  We will repeat echocardiogram to check left ventricular ejection fraction 2. Dilated cardiomyopathy: Again echocardiogram will be done 3. Essential hypertension blood pressure well controlled continue present medications. 4. Pacemaker will make arrangements for interrogation.   Medication Adjustments/Labs and Tests Ordered: Current medicines are reviewed at length with the patient today.  Concerns regarding medicines are outlined above.  No orders of the defined types were placed in this encounter.  Medication changes: No orders of the defined types were placed in this encounter.   Signed, Park Liter, MD, [facility] 10/25/2017 11:41 AM    [employer]

## 2017-10-25 NOTE — Patient Instructions (Signed)
Medication Instructions:  Your physician recommends that you continue on your current medications as directed. Please refer to the Current Medication list given to you today.   Labwork: None.   Testing/Procedures: Your physician has requested that you have an echocardiogram. Echocardiography is a painless test that uses sound waves to create images of your heart. It provides your doctor with information about the size and shape of your heart and how well your heart's chambers and valves are working. This procedure takes approximately one hour. There are no restrictions for this procedure.    Follow-Up: Your physician recommends that you schedule a follow-up appointment in: 1 month.    Any Other Special Instructions Will Be Listed Below (If Applicable).  We will plan to have your pacemaker interrogated the same day as your echo.     If you need a refill on your cardiac medications before your next appointment, please call your pharmacy.  Echocardiogram An echocardiogram, or echocardiography, uses sound waves (ultrasound) to produce an image of your heart. The echocardiogram is simple, painless, obtained within a short period of time, and offers valuable information to your health care provider. The images from an echocardiogram can provide information such as:  Evidence of coronary artery disease (CAD).  Heart size.  Heart muscle function.  Heart valve function.  Aneurysm detection.  Evidence of a past heart attack.  Fluid buildup around the heart.  Heart muscle thickening.  Assess heart valve function.  Tell a health care provider about:  Any allergies you have.  All medicines you are taking, including vitamins, herbs, eye drops, creams, and over-the-counter medicines.  Any problems you or family members have had with anesthetic medicines.  Any blood disorders you have.  Any surgeries you have had.  Any medical conditions you have.  Whether you are pregnant  or may be pregnant. What happens before the procedure? No special preparation is needed. Eat and drink normally. What happens during the procedure?  In order to produce an image of your heart, gel will be applied to your chest and a wand-like tool (transducer) will be moved over your chest. The gel will help transmit the sound waves from the transducer. The sound waves will harmlessly bounce off your heart to allow the heart images to be captured in real-time motion. These images will then be recorded.  You may need an IV to receive a medicine that improves the quality of the pictures. What happens after the procedure? You may return to your normal schedule including diet, activities, and medicines, unless your health care provider tells you otherwise. This information is not intended to replace advice given to you by your health care provider. Make sure you discuss any questions you have with your health care provider. Document Released: 03/03/2000 Document Revised: 10/23/2015 Document Reviewed: 11/11/2012 Elsevier Interactive Patient Education  2017 [employer].

## 2017-10-30 ENCOUNTER — Ambulatory Visit: Payer: Medicare Other

## 2017-10-30 ENCOUNTER — Other Ambulatory Visit: Payer: Self-pay

## 2017-10-30 ENCOUNTER — Ambulatory Visit (INDEPENDENT_AMBULATORY_CARE_PROVIDER_SITE_OTHER): Payer: Medicare Other

## 2017-10-30 DIAGNOSIS — I482 Chronic atrial fibrillation, unspecified: Secondary | ICD-10-CM

## 2017-10-30 DIAGNOSIS — I420 Dilated cardiomyopathy: Secondary | ICD-10-CM | POA: Diagnosis not present

## 2017-10-30 NOTE — Progress Notes (Addendum)
Complete echocardiogram has been performed.  Jimmy Lorea Kupfer RDCS 

## 2017-10-31 ENCOUNTER — Telehealth: Payer: Self-pay

## 2017-10-31 NOTE — Telephone Encounter (Signed)
Left voicemail for the patient to call the office regarding results. 

## 2017-11-09 DIAGNOSIS — Z23 Encounter for immunization: Secondary | ICD-10-CM | POA: Diagnosis not present

## 2017-11-28 ENCOUNTER — Ambulatory Visit: Payer: Medicare Other | Admitting: Cardiology

## 2017-12-13 DIAGNOSIS — M4120 Other idiopathic scoliosis, site unspecified: Secondary | ICD-10-CM | POA: Diagnosis not present

## 2017-12-13 DIAGNOSIS — M545 Low back pain: Secondary | ICD-10-CM | POA: Diagnosis not present

## 2017-12-19 DIAGNOSIS — S61210A Laceration without foreign body of right index finger without damage to nail, initial encounter: Secondary | ICD-10-CM | POA: Diagnosis not present

## 2017-12-19 DIAGNOSIS — S61219D Laceration without foreign body of unspecified finger without damage to nail, subsequent encounter: Secondary | ICD-10-CM | POA: Diagnosis not present

## 2017-12-19 DIAGNOSIS — Z4800 Encounter for change or removal of nonsurgical wound dressing: Secondary | ICD-10-CM | POA: Diagnosis not present

## 2017-12-19 DIAGNOSIS — Z23 Encounter for immunization: Secondary | ICD-10-CM | POA: Diagnosis not present

## 2017-12-19 DIAGNOSIS — S61219A Laceration without foreign body of unspecified finger without damage to nail, initial encounter: Secondary | ICD-10-CM | POA: Diagnosis not present

## 2017-12-25 ENCOUNTER — Other Ambulatory Visit: Payer: Self-pay | Admitting: Neurosurgery

## 2017-12-25 DIAGNOSIS — M4120 Other idiopathic scoliosis, site unspecified: Secondary | ICD-10-CM

## 2018-01-04 ENCOUNTER — Ambulatory Visit
Admission: RE | Admit: 2018-01-04 | Discharge: 2018-01-04 | Disposition: A | Discharge status: Home / Self Care | Payer: Medicare Other | Source: Ambulatory Visit | Attending: Neurosurgery | Admitting: Neurosurgery

## 2018-01-04 DIAGNOSIS — M4326 Fusion of spine, lumbar region: Secondary | ICD-10-CM | POA: Diagnosis not present

## 2018-01-04 DIAGNOSIS — M4120 Other idiopathic scoliosis, site unspecified: Secondary | ICD-10-CM

## 2018-01-04 DIAGNOSIS — M1612 Unilateral primary osteoarthritis, left hip: Secondary | ICD-10-CM | POA: Diagnosis not present

## 2018-01-04 DIAGNOSIS — M545 Low back pain: Secondary | ICD-10-CM | POA: Diagnosis not present

## 2018-01-08 DIAGNOSIS — I480 Paroxysmal atrial fibrillation: Secondary | ICD-10-CM | POA: Diagnosis not present

## 2018-01-11 DIAGNOSIS — Z950 Presence of cardiac pacemaker: Secondary | ICD-10-CM | POA: Diagnosis not present

## 2018-01-15 DIAGNOSIS — M5440 Lumbago with sciatica, unspecified side: Secondary | ICD-10-CM | POA: Diagnosis not present

## 2018-01-16 DIAGNOSIS — I4891 Unspecified atrial fibrillation: Secondary | ICD-10-CM | POA: Diagnosis not present

## 2018-01-18 DIAGNOSIS — M5416 Radiculopathy, lumbar region: Secondary | ICD-10-CM | POA: Diagnosis not present

## 2018-01-18 DIAGNOSIS — M48061 Spinal stenosis, lumbar region without neurogenic claudication: Secondary | ICD-10-CM | POA: Diagnosis not present

## 2018-02-07 DIAGNOSIS — M25511 Pain in right shoulder: Secondary | ICD-10-CM | POA: Diagnosis not present

## 2018-02-07 DIAGNOSIS — G8929 Other chronic pain: Secondary | ICD-10-CM | POA: Diagnosis not present

## 2018-02-07 DIAGNOSIS — M5440 Lumbago with sciatica, unspecified side: Secondary | ICD-10-CM | POA: Diagnosis not present

## 2018-02-19 DIAGNOSIS — I10 Essential (primary) hypertension: Secondary | ICD-10-CM | POA: Diagnosis not present

## 2018-02-19 DIAGNOSIS — M25511 Pain in right shoulder: Secondary | ICD-10-CM | POA: Diagnosis not present

## 2018-02-19 DIAGNOSIS — M5416 Radiculopathy, lumbar region: Secondary | ICD-10-CM | POA: Diagnosis not present

## 2018-02-19 DIAGNOSIS — M7541 Impingement syndrome of right shoulder: Secondary | ICD-10-CM | POA: Diagnosis not present

## 2018-02-19 DIAGNOSIS — Z6833 Body mass index (BMI) 33.0-33.9, adult: Secondary | ICD-10-CM | POA: Diagnosis not present

## 2018-02-19 DIAGNOSIS — G5600 Carpal tunnel syndrome, unspecified upper limb: Secondary | ICD-10-CM

## 2018-02-19 DIAGNOSIS — G5601 Carpal tunnel syndrome, right upper limb: Secondary | ICD-10-CM | POA: Diagnosis not present

## 2018-02-19 HISTORY — DX: Carpal tunnel syndrome, unspecified upper limb: G56.00

## 2018-03-01 ENCOUNTER — Ambulatory Visit (INDEPENDENT_AMBULATORY_CARE_PROVIDER_SITE_OTHER): Payer: Medicare Other | Admitting: Cardiology

## 2018-03-01 ENCOUNTER — Encounter: Payer: Self-pay | Admitting: Cardiology

## 2018-03-01 VITALS — BP 142/62 | HR 67 | Ht 61.0 in | Wt 157.4 lb

## 2018-03-01 DIAGNOSIS — I10 Essential (primary) hypertension: Secondary | ICD-10-CM

## 2018-03-01 DIAGNOSIS — Z45018 Encounter for adjustment and management of other part of cardiac pacemaker: Secondary | ICD-10-CM | POA: Diagnosis not present

## 2018-03-01 DIAGNOSIS — I480 Paroxysmal atrial fibrillation: Secondary | ICD-10-CM

## 2018-03-01 DIAGNOSIS — I420 Dilated cardiomyopathy: Secondary | ICD-10-CM | POA: Diagnosis not present

## 2018-03-01 MED ORDER — METOPROLOL SUCCINATE ER 25 MG PO TB24: 25.0000 mg | ORAL_TABLET | Freq: Every day | ORAL | 1 refills | Status: DC

## 2018-03-01 NOTE — Progress Notes (Signed)
Cardiology Office Note:    Date:  03/01/2018   ID:  [name], [name] [birth year], MRN [id number]  PCP:  Nicoletta Dress, MD  Cardiologist:  Jenne Campus, MD    Referring MD: Nicoletta Dress, MD   Chief Complaint  Patient presents with  . Echo follow up  Doing fine  History of Present Illness:    [name] is a 84 y.o. female with paroxysmal atrial fibrillation, mildly diminished left ventricular ejection fraction 40 to 45%, pacemaker implantation.  Comes today to up to follow-up still is quite interesting we repeat echocardiogram to check left ventricular ejection fraction to see if it safe for her to continue with flecainide ejection fraction on echocardiogram 40 to 45% she was asked to stop flecainide which she did however she said she felt very poorly without it and she started back flecainide at the previous dose a month later in the meantime she visited a different cardiologist here in town to get kind of a second opinion and.  No conclusion was reached at that visit and she was referred back to me for consideration of appropriate therapy.  I review her records and her last EKG that I see showing atrial fibrillation I did also review pacemaker interrogation of her device which showed more than 50% time atrial fibrillation therefore I think we can reach conclusion that for several flecainide is not sufficiently effective and at the same time is dangerous because of diminished left ventricular ejection fraction.  I recommend to discontinue flecainide I will give her small dose of beta-blockers will be Toprol-XL 25 mg daily to control her ventricular rate.  In the meantime we will continue present management.  She does not want to take anticoagulation since previously she had a problem with a tooth that required blood transfusions  Past Medical History:  Diagnosis Date  . A-fib ([place])   . Anemia   . Aortic valve disorder   . Arthritis    osteoarthritis - back and knee    . Benign hypertensive heart disease without heart failure   . Blood transfusion   . Cataract    bilateral  . Chronic back pain   . History of stomach ulcers   . Hyperlipidemia   . Hypertension   . Pacemaker   . Rotator cuff tear arthropathy of right shoulder 11/16/2015  . Status post lumbar surgery     Past Surgical History:  Procedure Laterality Date  . ABDOMINAL HYSTERECTOMY    . BACK SURGERY     five back surgeries  . CHOLECYSTECTOMY    . COLONOSCOPY    . CRANIOTOMY     ? subdural hematoma?  Sustained head injury after falling while taking Coumadin  . ESOPHAGOGASTRODUODENOSCOPY    . EYE SURGERY Bilateral    cataract surgery with lens implant  . FRACTURE SURGERY     left wrist, with plate and removal of plate  . INSERT / REPLACE / REMOVE PACEMAKER     Medtronic  . KNEE ARTHROPLASTY     right knee  . REVERSE SHOULDER ARTHROPLASTY Right 11/16/2015  . REVERSE SHOULDER ARTHROPLASTY Right 11/16/2015   Procedure: REVERSE SHOULDER ARTHROPLASTY;  Surgeon: Marchia Bond, MD;  Location: [place];  Service: Orthopedics;  Laterality: Right;  . SHOULDER OPEN ROTATOR CUFF REPAIR  right    Current Medications: Current Meds  Medication Sig  . alendronate (FOSAMAX) 70 MG tablet Take 70 mg by mouth once a week. Take with a full glass of water  on an empty stomach.  . ALPRAZolam (XANAX) 0.25 MG tablet Take 0.25 mg by mouth at bedtime as needed for anxiety.  Marland Kitchen aspirin EC 81 MG tablet Take 81 mg by mouth at bedtime.    . Calcium Carb-Cholecalciferol (CALCIUM-VITAMIN D) 500-200 MG-UNIT tablet Take 1 tablet 2 (two) times daily by mouth.  . Coenzyme Q10 100 MG capsule Take 100 mg daily by mouth.   . cyanocobalamin ([employer]S VITAMIN B12) 2000 MCG tablet Take 2,000 mcg by mouth daily.  . famotidine (PEPCID) 40 MG tablet Take 40 mg by mouth daily.  Marland Kitchen gabapentin (NEURONTIN) 100 MG capsule Take 100 mg by mouth daily.   . hydrochlorothiazide (HYDRODIURIL) 25 MG tablet Take 25 mg by mouth daily.  Marland Kitchen Kelp  100 MG TABS Take 100 mg by mouth daily.  . Multiple Vitamin (MULTIVITAMIN) tablet Take 1 tablet by mouth daily.  . Multiple Vitamins-Minerals (MULTIVITAMIN WITH MINERALS) tablet Take 1 tablet daily by mouth.  . oxyCODONE-acetaminophen (PERCOCET) 10-325 MG tablet Take 1-2 tablets by mouth every 4 (four) hours as needed for pain. For pain  . pantoprazole (PROTONIX) 40 MG tablet Take 40 mg by mouth daily.  . sennosides-docusate sodium (SENOKOT-S) 8.6-50 MG tablet Take 2 tablets by mouth daily. (Patient taking differently: Take 2 tablets by mouth as needed. )  . valsartan (DIOVAN) 320 MG tablet Take 1 tablet by mouth daily.     Allergies:   Amlodipine; Levofloxacin; and Penicillins   Social History   Socioeconomic History  . Marital status: Widowed    Spouse name: Not on file  . Number of children: Not on file  . Years of education: Not on file  . Highest education level: Not on file  Occupational History  . Not on file  Social Needs  . Financial resource strain: Not on file  . Food insecurity:    Worry: Not on file    Inability: Not on file  . Transportation needs:    Medical: Not on file    Non-medical: Not on file  Tobacco Use  . Smoking status: Never Smoker  . Smokeless tobacco: Never Used  Substance and Sexual Activity  . Alcohol use: No  . Drug use: No  . Sexual activity: Not on file  Lifestyle  . Physical activity:    Days per week: Not on file    Minutes per session: Not on file  . Stress: Not on file  Relationships  . Social connections:    Talks on phone: Not on file    Gets together: Not on file    Attends religious service: Not on file    Active member of club or organization: Not on file    Attends meetings of clubs or organizations: Not on file    Relationship status: Not on file  Other Topics Concern  . Not on file  Social History Narrative  . Not on file     Family History: The patient's family history includes Alcoholism in her father; Diabetes in  her brother, brother, brother, and mother; Throat cancer in her brother; Uterine cancer in her mother. ROS:   Please see the history of present illness.    All 14 point review of systems negative except as described per history of present illness  EKGs/Labs/Other Studies Reviewed:      Recent Labs: No results found for requested labs within last 8760 hours.  Recent Lipid Panel No results found for: CHOL, TRIG, HDL, CHOLHDL, VLDL, LDLCALC, LDLDIRECT  Physical Exam:  VS:  BP (!) 142/62   Pulse 67   Ht 5\' 1"  (1.549 m)   Wt 157 lb 6.4 oz (71.4 kg)   SpO2 (!) 14%   BMI 29.74 kg/m     Wt Readings from Last 3 Encounters:  03/01/18 157 lb 6.4 oz (71.4 kg)  10/25/17 157 lb 6.4 oz (71.4 kg)  09/17/17 146 lb (66.2 kg)     GEN:  Well nourished, well developed in no acute distress HEENT: Normal NECK: No JVD; No carotid bruits LYMPHATICS: No lymphadenopathy CARDIAC: RRR, no murmurs, no rubs, no gallops RESPIRATORY:  Clear to auscultation without rales, wheezing or rhonchi  ABDOMEN: Soft, non-tender, non-distended MUSCULOSKELETAL:  No edema; No deformity  SKIN: Warm and dry LOWER EXTREMITIES: no swelling NEUROLOGIC:  Alert and oriented x 3 PSYCHIATRIC:  Normal affect   ASSESSMENT:    1. Paroxysmal atrial fibrillation ([facility])   2. Dilated cardiomyopathy ([place])   3. Essential hypertension   4. Pacemaker reprogramming/check    PLAN:    In order of problems listed above:  1. Paroxysmal atrial fibrillation it looks more likely persistent atrial fibrillation now may be from permanent atrial fibrillation.  Will discontinue flecainide will put her on 25 mg Metroprolol succinate once a day.  Not anticoagulated because of history of GI bleed and she does not want it. 2. Dilated cardiomyopathy.  I will not add any medication to her medical regimen however I would like to see her back in the office in about a month and then appropriate therapy for cardiomyopathy will be initiated.  Simply  does not want to do too many things at once. 3. Essential hypertension blood pressure well controlled continue present management. 4. Pacemaker present and will enroll her in our pacemaker clinic.   Medication Adjustments/Labs and Tests Ordered: Current medicines are reviewed at length with the patient today.  Concerns regarding medicines are outlined above.  No orders of the defined types were placed in this encounter.  Medication changes: No orders of the defined types were placed in this encounter.   Signed, Park Liter, MD, [facility] 03/01/2018 3:31 PM    [place]

## 2018-03-01 NOTE — Patient Instructions (Signed)
Medication Instructions:  Your physician has recommended you make the following change in your medication:   STOP: Flecainide  START: Metoprolol succinate 25 mg daily    If you need a refill on your cardiac medications before your next appointment, please call your pharmacy.   Lab work: None.  If you have labs (blood work) drawn today and your tests are completely normal, you will receive your results only by: Marland Kitchen MyChart Message (if you have MyChart) OR . A paper copy in the mail If you have any lab test that is abnormal or we need to change your treatment, we will call you to review the results.  Testing/Procedures: None.   Follow-Up: At [facility], you and your health needs are our priority.  As part of our continuing mission to provide you with exceptional heart care, we have created designated Provider Care Teams.  These Care Teams include your primary Cardiologist (physician) and Advanced Practice Providers (APPs -  Physician Assistants and Nurse Practitioners) who all work together to provide you with the care you need, when you need it. You will need a follow up appointment in 1 months.  Please call our office 2 months in advance to schedule this appointment.  You may see No primary care provider on file. or another member of our [employer] Provider Team in [place]: Shirlee More, MD . Jyl Heinz, MD  Any Other Special Instructions Will Be Listed Below (If Applicable).  Metoprolol extended-release tablets What is this medicine? METOPROLOL (me TOE proe lole) is a beta-blocker. Beta-blockers reduce the workload on the heart and help it to beat more regularly. This medicine is used to treat high blood pressure and to prevent chest pain. It is also used to after a heart attack and to prevent an additional heart attack from occurring. This medicine may be used for other purposes; ask your health care provider or pharmacist if you have questions. COMMON BRAND NAME(S):  toprol, Toprol XL What should I tell my health care provider before I take this medicine? They need to know if you have any of these conditions: -diabetes -heart or vessel disease like slow heart rate, worsening heart failure, heart block, sick sinus syndrome or Raynaud's disease -kidney disease -liver disease -lung or breathing disease, like asthma or emphysema -pheochromocytoma -thyroid disease -an unusual or allergic reaction to metoprolol, other beta-blockers, medicines, foods, dyes, or preservatives -pregnant or trying to get pregnant -breast-feeding How should I use this medicine? Take this medicine by mouth with a glass of water. Follow the directions on the prescription label. Do not crush or chew. Take this medicine with or immediately after meals. Take your doses at regular intervals. Do not take more medicine than directed. Do not stop taking this medicine suddenly. This could lead to serious heart-related effects. Talk to your pediatrician regarding the use of this medicine in children. While this drug may be prescribed for children as young as 6 years for selected conditions, precautions do apply. Overdosage: If you think you have taken too much of this medicine contact a poison control center or emergency room at once. NOTE: This medicine is only for you. Do not share this medicine with others. What if I miss a dose? If you miss a dose, take it as soon as you can. If it is almost time for your next dose, take only that dose. Do not take double or extra doses. What may interact with this medicine? This medicine may interact with the following medications: -certain  medicines for blood pressure, heart disease, irregular heart beat -certain medicines for depression, like monoamine oxidase (MAO) inhibitors, fluoxetine, or paroxetine -clonidine -dobutamine -epinephrine -isoproterenol -reserpine This list may not describe all possible interactions. Give your health care provider a  list of all the medicines, herbs, non-prescription drugs, or dietary supplements you use. Also tell them if you smoke, drink alcohol, or use illegal drugs. Some items may interact with your medicine. What should I watch for while using this medicine? Visit your doctor or health care professional for regular check ups. Contact your doctor right away if your symptoms worsen. Check your blood pressure and pulse rate regularly. Ask your health care professional what your blood pressure and pulse rate should be, and when you should contact them. You may get drowsy or dizzy. Do not drive, use machinery, or do anything that needs mental alertness until you know how this medicine affects you. Do not sit or stand up quickly, especially if you are an older patient. This reduces the risk of dizzy or fainting spells. Contact your doctor if these symptoms continue. Alcohol may interfere with the effect of this medicine. Avoid alcoholic drinks. What side effects may I notice from receiving this medicine? Side effects that you should report to your doctor or health care professional as soon as possible: -allergic reactions like skin rash, itching or hives -cold or numb hands or feet -depression -difficulty breathing -faint -fever with sore throat -irregular heartbeat, chest pain -rapid weight gain -swollen legs or ankles Side effects that usually do not require medical attention (report to your doctor or health care professional if they continue or are bothersome): -anxiety or nervousness -change in sex drive or performance -dry skin -headache -nightmares or trouble sleeping -short term memory loss -stomach upset or diarrhea -unusually tired This list may not describe all possible side effects. Call your doctor for medical advice about side effects. You may report side effects to FDA at 1-800-FDA-1088. Where should I keep my medicine? Keep out of the reach of children. Store at room temperature between  15 and 30 degrees C (59 and 86 degrees F). Throw away any unused medicine after the expiration date. NOTE: This sheet is a summary. It may not cover all possible information. If you have questions about this medicine, talk to your doctor, pharmacist, or health care provider.  2018 Elsevier/Gold Standard (2012-11-08 14:41:37)

## 2018-03-01 NOTE — Progress Notes (Signed)
ekg 

## 2018-03-26 DIAGNOSIS — Z6827 Body mass index (BMI) 27.0-27.9, adult: Secondary | ICD-10-CM | POA: Diagnosis not present

## 2018-03-26 DIAGNOSIS — I10 Essential (primary) hypertension: Secondary | ICD-10-CM | POA: Diagnosis not present

## 2018-03-26 DIAGNOSIS — M7541 Impingement syndrome of right shoulder: Secondary | ICD-10-CM | POA: Diagnosis not present

## 2018-03-26 DIAGNOSIS — G5601 Carpal tunnel syndrome, right upper limb: Secondary | ICD-10-CM | POA: Diagnosis not present

## 2018-03-26 DIAGNOSIS — M5416 Radiculopathy, lumbar region: Secondary | ICD-10-CM | POA: Diagnosis not present

## 2018-04-01 ENCOUNTER — Ambulatory Visit (INDEPENDENT_AMBULATORY_CARE_PROVIDER_SITE_OTHER): Payer: Medicare Other | Admitting: Cardiology

## 2018-04-01 ENCOUNTER — Encounter: Payer: Self-pay | Admitting: Cardiology

## 2018-04-01 VITALS — BP 130/80 | HR 87 | Ht 63.0 in | Wt 154.8 lb

## 2018-04-01 DIAGNOSIS — I482 Chronic atrial fibrillation, unspecified: Secondary | ICD-10-CM

## 2018-04-01 DIAGNOSIS — I480 Paroxysmal atrial fibrillation: Secondary | ICD-10-CM | POA: Diagnosis not present

## 2018-04-01 DIAGNOSIS — I10 Essential (primary) hypertension: Secondary | ICD-10-CM

## 2018-04-01 DIAGNOSIS — I420 Dilated cardiomyopathy: Secondary | ICD-10-CM | POA: Diagnosis not present

## 2018-04-01 DIAGNOSIS — Z950 Presence of cardiac pacemaker: Secondary | ICD-10-CM | POA: Diagnosis not present

## 2018-04-01 MED ORDER — DILTIAZEM HCL ER COATED BEADS 120 MG PO CP24: 120.0000 mg | ORAL_CAPSULE | Freq: Every day | ORAL | 1 refills | Status: DC

## 2018-04-01 NOTE — Progress Notes (Signed)
Cardiology Office Note:    Date:  04/01/2018   ID:  [name] 1931/08/09, MRN [id number]  PCP:  Nicoletta Dress, MD  Cardiologist:  Jenne Campus, MD    Referring MD: Nicoletta Dress, MD   Chief Complaint  Patient presents with  . 1 month Follow up  Not doing well  History of Present Illness:    [name] is a 85 y.o. female with paroxysmal atrial fibrillation as well as cardiomyopathy ejection fraction 4045%.  She has been taking flecainide for years and she thinks that is the only medication that helps her however I was forced to discontinue this medication since she does have diminished left ventricular ejection fraction.  Since that time she complained of not feeling well I try to put her on a small dose of beta-blocker which was only 25 mg metoprolol but she complained of having some sore mild and she thinks it is related to her metoprolol she stopped that medication and she went back on flecainide.  I told her again that this is dangerous to go back on the medication with her diminished ejection fraction I am not sure she understands fully what the potential risk of this is.  Regardless we will try to put her on different AV blocking agent I will try calcium channel blocker in form of Cardizem CD 120 hopefully she will be able to tolerate it.  Past Medical History:  Diagnosis Date  . A-fib ([place])   . Anemia   . Aortic valve disorder   . Arthritis    osteoarthritis - back and knee  . Benign hypertensive heart disease without heart failure   . Blood transfusion   . Cataract    bilateral  . Chronic back pain   . History of stomach ulcers   . Hyperlipidemia   . Hypertension   . Pacemaker   . Rotator cuff tear arthropathy of right shoulder 11/16/2015  . Status post lumbar surgery     Past Surgical History:  Procedure Laterality Date  . ABDOMINAL HYSTERECTOMY    . BACK SURGERY     five back surgeries  . CHOLECYSTECTOMY    . COLONOSCOPY    .  CRANIOTOMY     ? subdural hematoma?  Sustained head injury after falling while taking Coumadin  . ESOPHAGOGASTRODUODENOSCOPY    . EYE SURGERY Bilateral    cataract surgery with lens implant  . FRACTURE SURGERY     left wrist, with plate and removal of plate  . INSERT / REPLACE / REMOVE PACEMAKER     [employer]  . KNEE ARTHROPLASTY     right knee  . REVERSE SHOULDER ARTHROPLASTY Right 11/16/2015  . REVERSE SHOULDER ARTHROPLASTY Right 11/16/2015   Procedure: REVERSE SHOULDER ARTHROPLASTY;  Surgeon: Marchia Bond, MD;  Location: [facility];  Service: Orthopedics;  Laterality: Right;  . SHOULDER OPEN ROTATOR CUFF REPAIR  right    Current Medications: Current Meds  Medication Sig  . alendronate (FOSAMAX) 70 MG tablet Take 70 mg by mouth once a week. Take with a full glass of water on an empty stomach.  . ALPRAZolam (XANAX) 0.25 MG tablet Take 0.25 mg by mouth at bedtime as needed for anxiety.  Marland Kitchen aspirin EC 81 MG tablet Take 81 mg by mouth at bedtime.    . Calcium Carb-Cholecalciferol (CALCIUM-VITAMIN D) 500-200 MG-UNIT tablet Take 1 tablet 2 (two) times daily by mouth.  . Coenzyme Q10 100 MG capsule Take 100 mg daily by mouth.   Marland Kitchen  cyanocobalamin ([employer] VITAMIN B12) 2000 MCG tablet Take 2,000 mcg by mouth daily.  . famotidine (PEPCID) 40 MG tablet Take 40 mg by mouth daily.  Marland Kitchen gabapentin (NEURONTIN) 100 MG capsule Take 100 mg by mouth daily.   . hydrochlorothiazide (HYDRODIURIL) 25 MG tablet Take 25 mg by mouth daily.  Marland Kitchen Kelp 100 MG TABS Take 100 mg by mouth daily.  . Multiple Vitamin (MULTIVITAMIN) tablet Take 1 tablet by mouth daily.  . Multiple Vitamins-Minerals (MULTIVITAMIN WITH MINERALS) tablet Take 1 tablet daily by mouth.  . oxyCODONE-acetaminophen (PERCOCET) 10-325 MG tablet Take 1-2 tablets by mouth every 4 (four) hours as needed for pain. For pain  . pantoprazole (PROTONIX) 40 MG tablet Take 40 mg by mouth daily.  . sennosides-docusate sodium (SENOKOT-S) 8.6-50 MG tablet Take 2 tablets  by mouth daily. (Patient taking differently: Take 2 tablets by mouth as needed. )  . valsartan (DIOVAN) 320 MG tablet Take 1 tablet by mouth daily.     Allergies:   Amlodipine; Levofloxacin; and Penicillins   Social History   Socioeconomic History  . Marital status: Widowed    Spouse name: Not on file  . Number of children: Not on file  . Years of education: Not on file  . Highest education level: Not on file  Occupational History  . Not on file  Social Needs  . Financial resource strain: Not on file  . Food insecurity:    Worry: Not on file    Inability: Not on file  . Transportation needs:    Medical: Not on file    Non-medical: Not on file  Tobacco Use  . Smoking status: Never Smoker  . Smokeless tobacco: Never Used  Substance and Sexual Activity  . Alcohol use: No  . Drug use: No  . Sexual activity: Not on file  Lifestyle  . Physical activity:    Days per week: Not on file    Minutes per session: Not on file  . Stress: Not on file  Relationships  . Social connections:    Talks on phone: Not on file    Gets together: Not on file    Attends religious service: Not on file    Active member of club or organization: Not on file    Attends meetings of clubs or organizations: Not on file    Relationship status: Not on file  Other Topics Concern  . Not on file  Social History Narrative  . Not on file     Family History: The patient's family history includes Alcoholism in her father; Diabetes in her brother, brother, brother, and mother; Throat cancer in her brother; Uterine cancer in her mother. ROS:   Please see the history of present illness.    All 14 point review of systems negative except as described per history of present illness  EKGs/Labs/Other Studies Reviewed:      Recent Labs: No results found for requested labs within last 8760 hours.  Recent Lipid Panel No results found for: CHOL, TRIG, HDL, CHOLHDL, VLDL, LDLCALC, LDLDIRECT  Physical Exam:      VS:  BP 130/80   Pulse 87   Ht 5\' 3"  (1.6 m)   Wt 154 lb 12.8 oz (70.2 kg)   SpO2 95%   BMI 27.42 kg/m     Wt Readings from Last 3 Encounters:  04/01/18 154 lb 12.8 oz (70.2 kg)  03/01/18 157 lb 6.4 oz (71.4 kg)  10/25/17 157 lb 6.4 oz (71.4 kg)  GEN:  Well nourished, well developed in no acute distress HEENT: Normal NECK: No JVD; No carotid bruits LYMPHATICS: No lymphadenopathy CARDIAC: RRR, no murmurs, no rubs, no gallops RESPIRATORY:  Clear to auscultation without rales, wheezing or rhonchi  ABDOMEN: Soft, non-tender, non-distended MUSCULOSKELETAL:  No edema; No deformity  SKIN: Warm and dry LOWER EXTREMITIES: no swelling NEUROLOGIC:  Alert and oriented x 3 PSYCHIATRIC:  Normal affect   ASSESSMENT:    1. Chronic atrial fibrillation   2. Dilated cardiomyopathy ([place])   3. Essential hypertension   4. Paroxysmal atrial fibrillation ([facility])   5. Pacemaker    PLAN:    In order of problems listed above:  1. Paroxysmal atrial fibrillation plan as outlined above. 2. Dilated cardiomyopathy I cannot add any medications right now to her medical regimen since she is so sensitive hopefully should be able to tolerate calcium channel blocker Essential hypertension blood pressures to be better controlled with medications Pacemaker followed by our EP team   Medication Adjustments/Labs and Tests Ordered: Current medicines are reviewed at length with the patient today.  Concerns regarding medicines are outlined above.  No orders of the defined types were placed in this encounter.  Medication changes:  Meds ordered this encounter  Medications  . diltiazem (CARDIZEM CD) 120 MG 24 hr capsule    Sig: Take 1 capsule (120 mg total) by mouth daily.    Dispense:  90 capsule    Refill:  1    Signed, Park Liter, MD, [facility] 04/01/2018 12:30 PM    [facility]

## 2018-04-01 NOTE — Patient Instructions (Signed)
Medication Instructions:  Your physician has recommended you make the following change in your medication:   Stop: Metoprolol  Start: Cardizem 120 mg daily  If you need a refill on your cardiac medications before your next appointment, please call your pharmacy.   Lab work: None.  If you have labs (blood work) drawn today and your tests are completely normal, you will receive your results only by: Marland Kitchen MyChart Message (if you have MyChart) OR . A paper copy in the mail If you have any lab test that is abnormal or we need to change your treatment, we will call you to review the results.  Testing/Procedures: None.   Follow-Up: At [facility], you and your health needs are our priority.  As part of our continuing mission to provide you with exceptional heart care, we have created designated Provider Care Teams.  These Care Teams include your primary Cardiologist (physician) and Advanced Practice Providers (APPs -  Physician Assistants and Nurse Practitioners) who all work together to provide you with the care you need, when you need it. You will need a follow up appointment in 1 months.  Please call our office 2 months in advance to schedule this appointment.  You may see No primary care provider on file. or another member of our [employer] Provider Team in [place]: Shirlee More, MD . Jyl Heinz, MD  Any Other Special Instructions Will Be Listed Below (If Applicable).  Diltiazem tablets What is this medicine? DILTIAZEM (dil TYE a zem) is a calcium-channel blocker. It affects the amount of calcium found in your heart and muscle cells. This relaxes your blood vessels, which can reduce the amount of work the heart has to do. This medicine is used to treat chest pain caused by angina. This medicine may be used for other purposes; ask your health care provider or pharmacist if you have questions. COMMON BRAND NAME(S): Cardizem What should I tell my health care provider before I take  this medicine? They need to know if you have any of these conditions: -heart problems, low blood pressure, irregular heartbeat -liver disease -previous heart attack -an unusual or allergic reaction to diltiazem, other medicines, foods, dyes, or preservatives -pregnant or trying to get pregnant -breast-feeding How should I use this medicine? Take this medicine by mouth with a glass of water. Follow the directions on the prescription label. Do not cut, crush or chew this medicine. This medicine is usually taken before meals and at bedtime. Take your doses at regular intervals. Do not take your medicine more often then directed. Do not stop taking except on the advice of your doctor or health care professional. Talk to your pediatrician regarding the use of this medicine in children. Special care may be needed. Overdosage: If you think you have taken too much of this medicine contact a poison control center or emergency room at once. NOTE: This medicine is only for you. Do not share this medicine with others. What if I miss a dose? If you miss a dose, take it as soon as you can. If it is almost time for your next dose, take only that dose. Do not take double or extra doses. What may interact with this medicine? Do not take this medicine with any of the following: -cisapride -hawthorn -pimozide -ranolazine -red yeast rice This medicine may also interact with the following medications: -buspirone -carbamazepine -cimetidine -cyclosporine -digoxin -local anesthetics or general anesthetics -lovastatin -medicines for anxiety or difficulty sleeping like midazolam and triazolam -medicines for  high blood pressure or heart problems -quinidine -rifampin, rifabutin, or rifapentine This list may not describe all possible interactions. Give your health care provider a list of all the medicines, herbs, non-prescription drugs, or dietary supplements you use. Also tell them if you smoke, drink alcohol,  or use illegal drugs. Some items may interact with your medicine. What should I watch for while using this medicine? Check your blood pressure and pulse rate regularly. Ask your doctor or health care professional what your blood pressure and pulse rate should be and when you should contact him or her. You may feel dizzy or lightheaded. Do not drive, use machinery, or do anything that needs mental alertness until you know how this medicine affects you. To reduce the risk of dizzy or fainting spells, do not sit or stand up quickly, especially if you are an older patient. Alcohol can make you more dizzy or increase flushing and rapid heartbeats. Avoid alcoholic drinks. What side effects may I notice from receiving this medicine? Side effects that you should report to your doctor or health care professional as soon as possible: -allergic reactions like skin rash, itching or hives, swelling of the face, lips, or tongue -confusion, mental depression -feeling faint or lightheaded, falls -pinpoint red spots on the skin -redness, blistering, peeling or loosening of the skin, including inside the mouth -slow, irregular heartbeat -swelling of the ankles, feet -unusual bleeding or bruising Side effects that usually do not require medical attention (report to your doctor or health care professional if they continue or are bothersome): -change in sex drive or performance -constipation or diarrhea -flushing of the face -headache -nausea, vomiting -tired or weak -trouble sleeping This list may not describe all possible side effects. Call your doctor for medical advice about side effects. You may report side effects to FDA at 1-800-FDA-1088. Where should I keep my medicine? Keep out of the reach of children. Store at room temperature between 20 and 25 degrees C (68 and 77 degrees F). Protect from light. Keep container tightly closed. Throw away any unused medicine after the expiration date. NOTE: This sheet  is a summary. It may not cover all possible information. If you have questions about this medicine, talk to your doctor, pharmacist, or health care provider.  2019 Elsevier/Gold Standard (2013-02-17 10:54:31)

## 2018-04-02 DIAGNOSIS — G5601 Carpal tunnel syndrome, right upper limb: Secondary | ICD-10-CM | POA: Diagnosis not present

## 2018-04-09 DIAGNOSIS — G5601 Carpal tunnel syndrome, right upper limb: Secondary | ICD-10-CM | POA: Diagnosis not present

## 2018-04-18 DIAGNOSIS — E559 Vitamin D deficiency, unspecified: Secondary | ICD-10-CM | POA: Diagnosis not present

## 2018-04-18 DIAGNOSIS — I10 Essential (primary) hypertension: Secondary | ICD-10-CM | POA: Diagnosis not present

## 2018-04-18 DIAGNOSIS — I480 Paroxysmal atrial fibrillation: Secondary | ICD-10-CM | POA: Diagnosis not present

## 2018-04-18 DIAGNOSIS — D638 Anemia in other chronic diseases classified elsewhere: Secondary | ICD-10-CM | POA: Diagnosis not present

## 2018-05-02 ENCOUNTER — Encounter: Payer: Self-pay | Admitting: Cardiology

## 2018-05-02 ENCOUNTER — Ambulatory Visit (INDEPENDENT_AMBULATORY_CARE_PROVIDER_SITE_OTHER): Payer: Medicare Other | Admitting: Cardiology

## 2018-05-02 VITALS — BP 130/70 | HR 103 | Ht 61.0 in | Wt 149.6 lb

## 2018-05-02 DIAGNOSIS — I420 Dilated cardiomyopathy: Secondary | ICD-10-CM

## 2018-05-02 DIAGNOSIS — I482 Chronic atrial fibrillation, unspecified: Secondary | ICD-10-CM

## 2018-05-02 DIAGNOSIS — Z950 Presence of cardiac pacemaker: Secondary | ICD-10-CM | POA: Diagnosis not present

## 2018-05-02 DIAGNOSIS — I10 Essential (primary) hypertension: Secondary | ICD-10-CM

## 2018-05-02 MED ORDER — DILTIAZEM HCL ER COATED BEADS 180 MG PO CP24: 180.0000 mg | ORAL_CAPSULE | Freq: Every day | ORAL | 1 refills | Status: DC

## 2018-05-02 NOTE — Progress Notes (Signed)
Cardiology Office Note:    Date:  05/02/2018   ID:  [name], [name] [name] 24, 1933, MRN [id number]  PCP:  Nicoletta Dress, MD  Cardiologist:  Jenne Campus, MD    Referring MD: Nicoletta Dress, MD   Chief Complaint  Patient presents with  . 1 month follow up  Doing well  History of Present Illness:    [name] is a 85 y.o. female with chronic atrial fibrillation essential hypertension pacemaker.  Overall she is doing well I was able to put her on Cardizem CD 120 and likely she is able to tolerated before we had this issue that she insist on getting on flecainide which is actually not indicated for somebody with diminished ejection fraction on top of that she is persistently/permanent in atrial fibrillation so there was no point of flecainide.  Luckily she is tolerating Cardizem CD heart rate still low be excessive we will increase the dose to 180 mg daily.  No chest pain tightness squeezing pressure burning chest  Past Medical History:  Diagnosis Date  . A-fib ([place])   . Anemia   . Aortic valve disorder   . Arthritis    osteoarthritis - back and knee  . Benign hypertensive heart disease without heart failure   . Blood transfusion   . Cataract    bilateral  . Chronic back pain   . History of stomach ulcers   . Hyperlipidemia   . Hypertension   . Pacemaker   . Rotator cuff tear arthropathy of right shoulder 11/16/2015  . Status post lumbar surgery     Past Surgical History:  Procedure Laterality Date  . ABDOMINAL HYSTERECTOMY    . BACK SURGERY     five back surgeries  . CHOLECYSTECTOMY    . COLONOSCOPY    . CRANIOTOMY     ? subdural hematoma?  Sustained head injury after falling while taking Coumadin  . ESOPHAGOGASTRODUODENOSCOPY    . EYE SURGERY Bilateral    cataract surgery with lens implant  . FRACTURE SURGERY     left wrist, with plate and removal of plate  . INSERT / REPLACE / REMOVE PACEMAKER     Medtronic  . KNEE ARTHROPLASTY     right  knee  . REVERSE SHOULDER ARTHROPLASTY Right 11/16/2015  . REVERSE SHOULDER ARTHROPLASTY Right 11/16/2015   Procedure: REVERSE SHOULDER ARTHROPLASTY;  Surgeon: Marchia Bond, MD;  Location: [facility];  Service: Orthopedics;  Laterality: Right;  . SHOULDER OPEN ROTATOR CUFF REPAIR  right    Current Medications: Current Meds  Medication Sig  . alendronate (FOSAMAX) 70 MG tablet Take 70 mg by mouth once a week. Take with a full glass of water on an empty stomach.  . ALPRAZolam (XANAX) 0.25 MG tablet Take 0.25 mg by mouth at bedtime as needed for anxiety.  Marland Kitchen aspirin EC 81 MG tablet Take 81 mg by mouth at bedtime.    . Calcium Carb-Cholecalciferol (CALCIUM-VITAMIN D) 500-200 MG-UNIT tablet Take 1 tablet 2 (two) times daily by mouth.  . Coenzyme Q10 100 MG capsule Take 100 mg daily by mouth.   . cyanocobalamin ([employer]S VITAMIN B12) 2000 MCG tablet Take 2,000 mcg by mouth daily.  Marland Kitchen diltiazem (CARDIZEM CD) 120 MG 24 hr capsule Take 1 capsule (120 mg total) by mouth daily.  . famotidine (PEPCID) 40 MG tablet Take 40 mg by mouth daily.  Marland Kitchen gabapentin (NEURONTIN) 100 MG capsule Take 100 mg by mouth daily.   . hydrochlorothiazide (HYDRODIURIL) 25 MG  tablet Take 25 mg by mouth daily.  Marland Kitchen Kelp 100 MG TABS Take 100 mg by mouth daily.  . Multiple Vitamin (MULTIVITAMIN) tablet Take 1 tablet by mouth daily.  . Multiple Vitamins-Minerals (MULTIVITAMIN WITH MINERALS) tablet Take 1 tablet daily by mouth.  . oxyCODONE-acetaminophen (PERCOCET) 10-325 MG tablet Take 1-2 tablets by mouth every 4 (four) hours as needed for pain. For pain  . pantoprazole (PROTONIX) 40 MG tablet Take 40 mg by mouth daily.  . sennosides-docusate sodium (SENOKOT-S) 8.6-50 MG tablet Take 2 tablets by mouth daily. (Patient taking differently: Take 2 tablets by mouth as needed. )  . valsartan (DIOVAN) 320 MG tablet Take 1 tablet by mouth daily.     Allergies:   Amlodipine; Levofloxacin; and Penicillins   Social History   Socioeconomic History    . Marital status: Widowed    Spouse name: Not on file  . Number of children: Not on file  . Years of education: Not on file  . Highest education level: Not on file  Occupational History  . Not on file  Social Needs  . Financial resource strain: Not on file  . Food insecurity:    Worry: Not on file    Inability: Not on file  . Transportation needs:    Medical: Not on file    Non-medical: Not on file  Tobacco Use  . Smoking status: Never Smoker  . Smokeless tobacco: Never Used  Substance and Sexual Activity  . Alcohol use: No  . Drug use: No  . Sexual activity: Not on file  Lifestyle  . Physical activity:    Days per week: Not on file    Minutes per session: Not on file  . Stress: Not on file  Relationships  . Social connections:    Talks on phone: Not on file    Gets together: Not on file    Attends religious service: Not on file    Active member of club or organization: Not on file    Attends meetings of clubs or organizations: Not on file    Relationship status: Not on file  Other Topics Concern  . Not on file  Social History Narrative  . Not on file     Family History: The patient's family history includes Alcoholism in her father; Diabetes in her brother, brother, brother, and mother; Throat cancer in her brother; Uterine cancer in her mother. ROS:   Please see the history of present illness.    All 14 point review of systems negative except as described per history of present illness  EKGs/Labs/Other Studies Reviewed:      Recent Labs: No results found for requested labs within last 8760 hours.  Recent Lipid Panel No results found for: CHOL, TRIG, HDL, CHOLHDL, VLDL, LDLCALC, LDLDIRECT  Physical Exam:    VS:  BP 130/70   Pulse (!) 103   Ht 5\' 1"  (1.549 m)   Wt 149 lb 9.6 oz (67.9 kg)   SpO2 98%   BMI 28.27 kg/m     Wt Readings from Last 3 Encounters:  05/02/18 149 lb 9.6 oz (67.9 kg)  04/01/18 154 lb 12.8 oz (70.2 kg)  03/01/18 157 lb 6.4 oz  (71.4 kg)     GEN:  Well nourished, well developed in no acute distress HEENT: Normal NECK: No JVD; No carotid bruits LYMPHATICS: No lymphadenopathy CARDIAC: Irregular, no murmurs, no rubs, no gallops RESPIRATORY:  Clear to auscultation without rales, wheezing or rhonchi  ABDOMEN: Soft, non-tender, non-distended  MUSCULOSKELETAL:  No edema; No deformity  SKIN: Warm and dry LOWER EXTREMITIES: no swelling NEUROLOGIC:  Alert and oriented x 3 PSYCHIATRIC:  Normal affect   ASSESSMENT:    1. Chronic atrial fibrillation   2. Dilated cardiomyopathy ([place])   3. Essential hypertension   4. Pacemaker    PLAN:    In order of problems listed above:  1. Chronic atrial fibrillation rate control. 2. Dilated cardiomyopathy does not want to change any of her medications right now. 3. Essential hypertension well-controlled. 4. Pacemaker present we will make sure she will enroll in our pacemaker clinic.  Overall she is a difficult patients.  Is difficult to put on her on right medication or adjust her medications.   Medication Adjustments/Labs and Tests Ordered: Current medicines are reviewed at length with the patient today.  Concerns regarding medicines are outlined above.  No orders of the defined types were placed in this encounter.  Medication changes: No orders of the defined types were placed in this encounter.   Signed, Park Liter, MD, [facility] 05/02/2018 10:29 AM    [place]

## 2018-05-02 NOTE — Patient Instructions (Signed)
Medication Instructions:  Your physician has recommended you make the following change in your medication:  Increase: Cardizem to 180 mg daily  If you need a refill on your cardiac medications before your next appointment, please call your pharmacy.   Lab work: None.  If you have labs (blood work) drawn today and your tests are completely normal, you will receive your results only by: Marland Kitchen MyChart Message (if you have MyChart) OR . A paper copy in the mail If you have any lab test that is abnormal or we need to change your treatment, we will call you to review the results.  Testing/Procedures: None.   Follow-Up: At [facility], you and your health needs are our priority.  As part of our continuing mission to provide you with exceptional heart care, we have created designated Provider Care Teams.  These Care Teams include your primary Cardiologist (physician) and Advanced Practice Providers (APPs -  Physician Assistants and Nurse Practitioners) who all work together to provide you with the care you need, when you need it. You will need a follow up appointment in 3 months.  Please call our office 2 months in advance to schedule this appointment.  You may see No primary care provider on file. or another member of our [employer] Provider Team in [place]: Shirlee More, MD . Jyl Heinz, MD  Any Other Special Instructions Will Be Listed Below (If Applicable).

## 2018-05-07 DIAGNOSIS — G5601 Carpal tunnel syndrome, right upper limb: Secondary | ICD-10-CM | POA: Diagnosis not present

## 2018-05-13 DIAGNOSIS — G5601 Carpal tunnel syndrome, right upper limb: Secondary | ICD-10-CM | POA: Diagnosis not present

## 2018-05-21 DIAGNOSIS — R945 Abnormal results of liver function studies: Secondary | ICD-10-CM | POA: Diagnosis not present

## 2018-07-18 DIAGNOSIS — Z45018 Encounter for adjustment and management of other part of cardiac pacemaker: Secondary | ICD-10-CM | POA: Diagnosis not present

## 2018-07-18 DIAGNOSIS — I4891 Unspecified atrial fibrillation: Secondary | ICD-10-CM | POA: Diagnosis not present

## 2018-07-22 ENCOUNTER — Telehealth: Payer: Self-pay | Admitting: Emergency Medicine

## 2018-07-22 NOTE — Telephone Encounter (Signed)
Left message for patient to return call regarding consent and televisit next week.

## 2018-07-31 ENCOUNTER — Telehealth (INDEPENDENT_AMBULATORY_CARE_PROVIDER_SITE_OTHER): Payer: Medicare Other | Admitting: Cardiology

## 2018-07-31 ENCOUNTER — Encounter: Payer: Self-pay | Admitting: Cardiology

## 2018-07-31 ENCOUNTER — Other Ambulatory Visit: Payer: Self-pay

## 2018-07-31 VITALS — Wt 135.0 lb

## 2018-07-31 DIAGNOSIS — I10 Essential (primary) hypertension: Secondary | ICD-10-CM

## 2018-07-31 DIAGNOSIS — I482 Chronic atrial fibrillation, unspecified: Secondary | ICD-10-CM

## 2018-07-31 DIAGNOSIS — Z950 Presence of cardiac pacemaker: Secondary | ICD-10-CM

## 2018-07-31 MED ORDER — DILTIAZEM HCL ER COATED BEADS 240 MG PO CP24: 240.0000 mg | ORAL_CAPSULE | Freq: Every day | ORAL | 1 refills | Status: DC

## 2018-07-31 MED ORDER — DILTIAZEM HCL ER COATED BEADS 180 MG PO CP24: 180.0000 mg | ORAL_CAPSULE | Freq: Every day | ORAL | 1 refills | Status: DC

## 2018-07-31 MED ORDER — VALSARTAN 320 MG PO TABS: 160.0000 mg | ORAL_TABLET | Freq: Every day | ORAL | 1 refills | Status: DC

## 2018-07-31 NOTE — Progress Notes (Signed)
Virtual Visit via Telephone Note   This visit type was conducted due to national recommendations for restrictions regarding the COVID-19 Pandemic (e.g. social distancing) in an effort to limit this patient's exposure and mitigate transmission in our community.  Due to her co-morbid illnesses, this patient is at least at moderate risk for complications without adequate follow up.  This format is felt to be most appropriate for this patient at this time.  The patient did not have access to video technology/had technical difficulties with video requiring transitioning to audio format only (telephone).  All issues noted in this document were discussed and addressed.  No physical exam could be performed with this format.  Please refer to the patient's chart for her  consent to telehealth for [facility].  Evaluation Performed:  Follow-up visit  This visit type was conducted due to national recommendations for restrictions regarding the COVID-19 Pandemic (e.g. social distancing).  This format is felt to be most appropriate for this patient at this time.  All issues noted in this document were discussed and addressed.  No physical exam was performed (except for noted visual exam findings with Video Visits).  Please refer to the patient's chart (MyChart message for video visits and phone note for telephone visits) for the patient's consent to telehealth for [facility].  Date:  07/31/2018  ID: [name], DOB 08/26/1931, MRN [id number]   Patient Location: [place] [address]   Provider location:   [place] Office  PCP:  Nicoletta Dress, MD  Cardiologist:  Jenne Campus, MD     Chief Complaint: Doing well  History of Present Illness:    [name] is a 85 y.o. female  who presents via audio/video conferencing for a telehealth visit today.  With chronic atrial fibrillation, essential hypertension, dyslipidemia seems to be doing well but  complain of having some exertional shortness of breath.  Interrogation of her device showed to increase ventricular rate.  We talked at length about what to do with the symptoms and I think the best option will be to increase dose of diltiazem she takes 180 we will go to 240 I will also reduce dose of Diovan.  She reports to have some swelling of lower extremities at evening time and I warned her that with this medication increase it can get slightly worse and asked her to let me know if that is the case we will switch her from hydrochlorothiazide to furosemide.  Concern will be at that time blood pressure.  Otherwise denies having any chest pain tightness squeezing pressure burning chest no palpitations.  She keep up with restriction of coronavirus.   The patient does not have symptoms concerning for COVID-19 infection (fever, chills, cough, or new SHORTNESS OF BREATH).    Prior CV studies:   The following studies were reviewed today:  .     Past Medical History:  Diagnosis Date  . A-fib ([place])   . Anemia   . Aortic valve disorder   . Arthritis    osteoarthritis - back and knee  . Benign hypertensive heart disease without heart failure   . Blood transfusion   . Cataract    bilateral  . Chronic back pain   . History of stomach ulcers   . Hyperlipidemia   . Hypertension   . Pacemaker   . Rotator cuff tear arthropathy of right shoulder 11/16/2015  . Status post lumbar surgery     Past  Surgical History:  Procedure Laterality Date  . ABDOMINAL HYSTERECTOMY    . BACK SURGERY     five back surgeries  . CHOLECYSTECTOMY    . COLONOSCOPY    . CRANIOTOMY     ? subdural hematoma?  Sustained head injury after falling while taking Coumadin  . ESOPHAGOGASTRODUODENOSCOPY    . EYE SURGERY Bilateral    cataract surgery with lens implant  . FRACTURE SURGERY     left wrist, with plate and removal of plate  . INSERT / REPLACE / REMOVE PACEMAKER     Medtronic  . KNEE ARTHROPLASTY      right knee  . REVERSE SHOULDER ARTHROPLASTY Right 11/16/2015  . REVERSE SHOULDER ARTHROPLASTY Right 11/16/2015   Procedure: REVERSE SHOULDER ARTHROPLASTY;  Surgeon: Marchia Bond, MD;  Location: [facility];  Service: Orthopedics;  Laterality: Right;  . SHOULDER OPEN ROTATOR CUFF REPAIR  right     Current Meds  Medication Sig  . alendronate (FOSAMAX) 70 MG tablet Take 70 mg by mouth once a week. Take with a full glass of water on an empty stomach.  . ALPRAZolam (XANAX) 0.25 MG tablet Take 0.25 mg by mouth at bedtime as needed for anxiety.  Marland Kitchen aspirin EC 81 MG tablet Take 81 mg by mouth at bedtime.    . Calcium Carb-Cholecalciferol (CALCIUM-VITAMIN D) 500-200 MG-UNIT tablet Take 1 tablet 2 (two) times daily by mouth.  . Coenzyme Q10 100 MG capsule Take 100 mg daily by mouth.   . cyanocobalamin (CVS VITAMIN B12) 2000 MCG tablet Take 2,000 mcg by mouth daily.  Marland Kitchen diltiazem (CARDIZEM CD) 180 MG 24 hr capsule Take 1 capsule (180 mg total) by mouth daily.  . famotidine (PEPCID) 40 MG tablet Take 40 mg by mouth daily.  Marland Kitchen gabapentin (NEURONTIN) 100 MG capsule Take 100 mg by mouth daily.   . hydrochlorothiazide (HYDRODIURIL) 25 MG tablet Take 25 mg by mouth daily.  Marland Kitchen Kelp 100 MG TABS Take 100 mg by mouth daily.  . Multiple Vitamin (MULTIVITAMIN) tablet Take 1 tablet by mouth daily.  . Multiple Vitamins-Minerals (MULTIVITAMIN WITH MINERALS) tablet Take 1 tablet daily by mouth.  . oxyCODONE-acetaminophen (PERCOCET) 10-325 MG tablet Take 1-2 tablets by mouth every 4 (four) hours as needed for pain. For pain  . pantoprazole (PROTONIX) 40 MG tablet Take 40 mg by mouth daily.  . sennosides-docusate sodium (SENOKOT-S) 8.6-50 MG tablet Take 2 tablets by mouth daily. (Patient taking differently: Take 2 tablets by mouth as needed. )  . valsartan (DIOVAN) 320 MG tablet Take 1 tablet by mouth daily.      Family History: The patient's family history includes Alcoholism in her father; Diabetes in her brother,  brother, brother, and mother; Throat cancer in her brother; Uterine cancer in her mother.   ROS:   Please see the history of present illness.     All other systems reviewed and are negative.   Labs/Other Tests and Data Reviewed:     Recent Labs: No results found for requested labs within last 8760 hours.  Recent Lipid Panel No results found for: CHOL, TRIG, HDL, CHOLHDL, VLDL, LDLCALC, LDLDIRECT    Exam:    Vital Signs:  Wt 135 lb (61.2 kg)   BMI 25.51 kg/m     Wt Readings from Last 3 Encounters:  07/31/18 135 lb (61.2 kg)  05/02/18 149 lb 9.6 oz (67.9 kg)  04/01/18 154 lb 12.8 oz (70.2 kg)     Well nourished, well developed in no acute distress.  Alert awake oriented x3 talking to me over the phone and she has no technical ability to do video linking.  Overall doing well  Diagnosis for this visit:   1. Essential hypertension   2. Chronic atrial fibrillation   3. Pacemaker      ASSESSMENT & PLAN:    1.  Essential hypertension blood pressure appears to be well controlled we will continue present management. 2.  Permanent atrial fibrillation doing well from that point of view except for the fact that pacemaker shows increased ventricular rate.  Will go up with Cardizem CD to 240 as well as will reduce Diovan to make sure blood pressure stable at reasonable level.  I warned her about potential side effect of this maneuver and she will let me know if this happens more about potentially having worsening of swelling of lower extremities. 3.  Pacemaker of bone follow-up by [facility] group but will switch her to our service.  She will have appointment with the EP team to be reestablish as a patient.  COVID-19 Education: The signs and symptoms of COVID-19 were discussed with the patient and how to seek care for testing (follow up with PCP or arrange E-visit).  The importance of social distancing was discussed today.  Patient Risk:   After full review of this patients clinical  status, I feel that they are at least moderate risk at this time.  Time:   Today, I have spent 18 minutes with the patient with telehealth technology discussing pt health issues.  I spent 5 minutes reviewing her chart before the visit.  Visit was finished at 10:01 AM.    Medication Adjustments/Labs and Tests Ordered: Current medicines are reviewed at length with the patient today.  Concerns regarding medicines are outlined above.  No orders of the defined types were placed in this encounter.  Medication changes: No orders of the defined types were placed in this encounter.    Disposition: Follow-up in 3 months.  Signed, Park Liter, MD, [facility] 07/31/2018 10:01 AM    [place]

## 2018-07-31 NOTE — Addendum Note (Signed)
Addended by: Ashok Norris on: 07/31/2018 10:24 AM   Modules accepted: Orders

## 2018-07-31 NOTE — Patient Instructions (Signed)
Medication Instructions:  Your physician has recommended you make the following change in your medication:   INCREASE: Diltiazem to 240 mg daily  Decrease: Valsartan to on 160 mg daily  If you need a refill on your cardiac medications before your next appointment, please call your pharmacy.   Lab work: None.  If you have labs (blood work) drawn today and your tests are completely normal, you will receive your results only by: Marland Kitchen MyChart Message (if you have MyChart) OR . A paper copy in the mail If you have any lab test that is abnormal or we need to change your treatment, we will call you to review the results.  Testing/Procedures: None.   Follow-Up: At [facility], you and your health needs are our priority.  As part of our continuing mission to provide you with exceptional heart care, we have created designated Provider Care Teams.  These Care Teams include your primary Cardiologist (physician) and Advanced Practice Providers (APPs -  Physician Assistants and Nurse Practitioners) who all work together to provide you with the care you need, when you need it. You will need a follow up appointment in 3 months.  Please call our office 2 months in advance to schedule this appointment.  You may see No primary care provider on file. or another member of our [employer] Provider Team in [place]: Shirlee More, MD . Jyl Heinz, MD  Any Other Special Instructions Will Be Listed Below (If Applicable).  Dr. Curt Bears office will contact you about an appointment to be established for pacemaker management. If you do not hear from them within one week please call our office.

## 2018-08-01 ENCOUNTER — Other Ambulatory Visit: Payer: Self-pay

## 2018-08-01 NOTE — Telephone Encounter (Signed)
Pharmacy call concerning prescribed dosage for diltiazem. Per Dr.K note dated 07/31/18 cardizem CD increased to 240 mg once daily. RN relayed information to pharmacy tech.no further questions.

## 2018-08-06 ENCOUNTER — Telehealth: Payer: Self-pay | Admitting: Emergency Medicine

## 2018-08-06 DIAGNOSIS — M5030 Other cervical disc degeneration, unspecified cervical region: Secondary | ICD-10-CM | POA: Diagnosis not present

## 2018-08-06 DIAGNOSIS — I10 Essential (primary) hypertension: Secondary | ICD-10-CM

## 2018-08-06 DIAGNOSIS — M5412 Radiculopathy, cervical region: Secondary | ICD-10-CM | POA: Diagnosis not present

## 2018-08-06 NOTE — Telephone Encounter (Signed)
Left message for patient to return call to check on her and see how she is per Dr. Fraser Din. Will continue efforts.

## 2018-08-07 MED ORDER — FUROSEMIDE 40 MG PO TABS: 40.0000 mg | ORAL_TABLET | Freq: Every day | ORAL | 1 refills | Status: DC

## 2018-08-07 NOTE — Addendum Note (Signed)
Addended by: Ashok Norris on: 08/07/2018 11:51 AM   Modules accepted: Orders

## 2018-08-07 NOTE — Telephone Encounter (Signed)
Patient informed to stop hydrochlorothiazide and to start lasix 40 mg daily. Patient verbally understands. Patient also informed to have labs drawn in Monday. No further questions.

## 2018-08-07 NOTE — Telephone Encounter (Signed)
Patient called back and reports her feet swelling continues to be present, and is becoming painful. Denies any shortness of breath. She takes hydrochlorothiazide 25 mg daily and is wanting to know if she should increase this. Will consult with Dr. Fraser Din.

## 2018-08-07 NOTE — Telephone Encounter (Signed)
Stop HCTZ, start lasix 40 mg po qd  chem7 on MOnday

## 2018-08-08 ENCOUNTER — Telehealth: Payer: Self-pay | Admitting: Cardiology

## 2018-08-08 DIAGNOSIS — Z136 Encounter for screening for cardiovascular disorders: Secondary | ICD-10-CM | POA: Diagnosis not present

## 2018-08-08 DIAGNOSIS — Z1331 Encounter for screening for depression: Secondary | ICD-10-CM | POA: Diagnosis not present

## 2018-08-08 DIAGNOSIS — Z9181 History of falling: Secondary | ICD-10-CM | POA: Diagnosis not present

## 2018-08-08 DIAGNOSIS — Z1231 Encounter for screening mammogram for malignant neoplasm of breast: Secondary | ICD-10-CM | POA: Diagnosis not present

## 2018-08-08 DIAGNOSIS — Z0000 Encounter for general adult medical examination without abnormal findings: Secondary | ICD-10-CM | POA: Diagnosis not present

## 2018-08-08 DIAGNOSIS — N959 Unspecified menopausal and perimenopausal disorder: Secondary | ICD-10-CM | POA: Diagnosis not present

## 2018-08-08 DIAGNOSIS — E785 Hyperlipidemia, unspecified: Secondary | ICD-10-CM | POA: Diagnosis not present

## 2018-08-08 MED ORDER — FUROSEMIDE 40 MG PO TABS: 20.0000 mg | ORAL_TABLET | Freq: Every day | ORAL | 1 refills | Status: DC

## 2018-08-08 NOTE — Telephone Encounter (Signed)
Reduce to 20 mg po qd

## 2018-08-08 NOTE — Telephone Encounter (Signed)
Called patient and she reports she started lasix 40 mg today after stopping hydrochlorothiazide and she is feeling weak. She denies dizziness but is wondering of this dose is too much for her. Will consult with Dr. Agustin Cree.

## 2018-08-08 NOTE — Telephone Encounter (Signed)
Has questions about her Lasix

## 2018-08-08 NOTE — Telephone Encounter (Signed)
Called patient back and informed her to decrease lasix to 20 mg daily per Dr. Agustin Cree. Patient verbally understands and will call back if she doesn't start feeling better. No further questions.

## 2018-08-16 ENCOUNTER — Other Ambulatory Visit: Payer: Self-pay | Admitting: Cardiology

## 2018-08-20 DIAGNOSIS — M25511 Pain in right shoulder: Secondary | ICD-10-CM | POA: Diagnosis not present

## 2018-08-20 DIAGNOSIS — I10 Essential (primary) hypertension: Secondary | ICD-10-CM | POA: Diagnosis not present

## 2018-08-20 DIAGNOSIS — Z6826 Body mass index (BMI) 26.0-26.9, adult: Secondary | ICD-10-CM | POA: Diagnosis not present

## 2018-08-21 ENCOUNTER — Telehealth: Payer: Self-pay | Admitting: Cardiology

## 2018-08-21 DIAGNOSIS — I10 Essential (primary) hypertension: Secondary | ICD-10-CM | POA: Diagnosis not present

## 2018-08-21 LAB — BASIC METABOLIC PANEL
BUN/Creatinine Ratio: 22 (ref 12–28)
BUN: 24 mg/dL (ref 8–27)
CO2: 25 mmol/L (ref 20–29)
Calcium: 9.4 mg/dL (ref 8.7–10.3)
Chloride: 103 mmol/L (ref 96–106)
Creatinine, Ser: 1.09 mg/dL — ABNORMAL HIGH (ref 0.57–1.00)
GFR calc Af Amer: 53 mL/min/{1.73_m2} — ABNORMAL LOW (ref >59)
GFR calc non Af Amer: 46 mL/min/{1.73_m2} — ABNORMAL LOW (ref >59)
Glucose: 98 mg/dL (ref 65–99)
Potassium: 4.6 mmol/L (ref 3.5–5.2)
Sodium: 143 mmol/L (ref 134–144)

## 2018-08-21 NOTE — Telephone Encounter (Signed)
Left voice message for patient to return call. °

## 2018-08-21 NOTE — Telephone Encounter (Signed)
Pt states she was called and told to come in for labs on Monday, May 25th [facility]) she came and we were closed, wants to know if she was supposed to go somewhere else or does she need to come back again for labs

## 2018-08-21 NOTE — Telephone Encounter (Signed)
Pt returned call, usually comes to [place] office, instructed to have labs drawn at [place] office between 8-5 Monday-Fri except 12-1 when office is closed for lunch. She verbalizes understanding and expresses no additional questions or concerns

## 2018-08-23 ENCOUNTER — Encounter: Payer: Self-pay | Admitting: Neurology

## 2018-08-23 DIAGNOSIS — Z96611 Presence of right artificial shoulder joint: Secondary | ICD-10-CM | POA: Diagnosis not present

## 2018-08-23 DIAGNOSIS — M25511 Pain in right shoulder: Secondary | ICD-10-CM | POA: Diagnosis not present

## 2018-08-27 ENCOUNTER — Telehealth: Payer: Self-pay | Admitting: *Deleted

## 2018-08-27 NOTE — Telephone Encounter (Signed)
Appt 6/15   Medtronic Adapta [id number]  Serial Number  [id number]

## 2018-08-28 NOTE — Telephone Encounter (Signed)
[facility] transfer requested.

## 2018-08-28 NOTE — Telephone Encounter (Signed)
[facility] transfer in accepted. LMOVM requesting pt to send manual transmission.

## 2018-08-29 ENCOUNTER — Ambulatory Visit (INDEPENDENT_AMBULATORY_CARE_PROVIDER_SITE_OTHER): Payer: Medicare Other | Admitting: *Deleted

## 2018-08-29 DIAGNOSIS — I420 Dilated cardiomyopathy: Secondary | ICD-10-CM | POA: Diagnosis not present

## 2018-08-29 LAB — CUP PACEART REMOTE DEVICE CHECK
Battery Impedance: 2244 Ohm
Battery Remaining Longevity: 35 mo
Battery Voltage: 2.74 V
Brady Statistic RV Percent Paced: 8 %
Date Time Interrogation Session: 20200611130226
Implantable Lead Implant Date: 20120911
Implantable Lead Implant Date: 20120911
Implantable Lead Location: 753859
Implantable Lead Location: 753860
Implantable Lead Model: 4092
Implantable Lead Model: 5076
Implantable Pulse Generator Implant Date: 20120911
Lead Channel Impedance Value: 645 Ohm
Lead Channel Pacing Threshold Amplitude: 0.5 V
Lead Channel Pacing Threshold Pulse Width: 0.4 ms
Lead Channel Sensing Intrinsic Amplitude: 16 mV
Lead Channel Setting Pacing Amplitude: 2 V
Lead Channel Setting Pacing Pulse Width: 0.4 ms
Lead Channel Setting Sensing Sensitivity: 5.6 mV

## 2018-08-29 NOTE — Telephone Encounter (Signed)
Spoke with pt . She will send manual transmission today.

## 2018-08-29 NOTE — Telephone Encounter (Signed)
Transmission received.

## 2018-09-02 ENCOUNTER — Other Ambulatory Visit: Payer: Self-pay

## 2018-09-02 ENCOUNTER — Encounter: Payer: Medicare Other | Admitting: Cardiology

## 2018-09-02 ENCOUNTER — Telehealth: Payer: Self-pay | Admitting: *Deleted

## 2018-09-02 NOTE — Progress Notes (Signed)
This encounter was created in error - please disregard.

## 2018-09-02 NOTE — Telephone Encounter (Signed)
Late entry      Virtual Visit Pre-Appointment Phone Call  "(Name), I am calling you today to discuss your upcoming appointment. We are currently trying to limit exposure to the virus that causes COVID-19 by seeing patients at home rather than in the office."  1. "What is the BEST phone number to call the day of the visit?" - include this in appointment notes  2. "Do you have or have access to (through a family member/friend) a smartphone with video capability that we can use for your visit?" a. If yes - list this number in appt notes as "cell" (if different from BEST phone #) and list the appointment type as a VIDEO visit in appointment notes b. If no - list the appointment type as a PHONE visit in appointment notes  3. Confirm consent - "In the setting of the current Covid19 crisis, you are scheduled for a (phone or video) visit with your provider on (date) at (time).  Just as we do with many in-office visits, in order for you to participate in this visit, we must obtain consent.  If you'd like, I can send this to your mychart (if signed up) or email for you to review.  Otherwise, I can obtain your verbal consent now.  All virtual visits are billed to your insurance company just like a normal visit would be.  By agreeing to a virtual visit, we'd like you to understand that the technology does not allow for your provider to perform an examination, and thus may limit your provider's ability to fully assess your condition. If your provider identifies any concerns that need to be evaluated in person, we will make arrangements to do so.  Finally, though the technology is pretty good, we cannot assure that it will always work on either your or our end, and in the setting of a video visit, we may have to convert it to a phone-only visit.  In either situation, we cannot ensure that we have a secure connection.  Are you willing to proceed?" STAFF: Did the patient verbally acknowledge consent to telehealth  visit? Document YES/NO here: yes  4. Advise patient to be prepared - "Two hours prior to your appointment, go ahead and check your blood pressure, pulse, oxygen saturation, and your weight (if you have the equipment to check those) and write them all down. When your visit starts, your provider will ask you for this information. If you have an Apple Watch or Kardia device, please plan to have heart rate information ready on the day of your appointment. Please have a pen and paper handy nearby the day of the visit as well."  5. Give patient instructions for MyChart download to smartphone OR Doximity/Doxy.me as below if video visit (depending on what platform provider is using)  6. Inform patient they will receive a phone call 15 minutes prior to their appointment time (may be from unknown caller ID) so they should be prepared to answer    TELEPHONE CALL NOTE  [name] has been deemed a candidate for a follow-up tele-health visit to limit community exposure during the Covid-19 pandemic. I spoke with the patient via phone to ensure availability of phone/video source, confirm preferred email & phone number, and discuss instructions and expectations.  I reminded [name] to be prepared with any vital sign and/or heart rhythm information that could potentially be obtained via home monitoring, at the time of her visit. I reminded [name] to expect  a phone call prior to her visit.  Chani Ghanem 09/02/2018 8:27 AM   INSTRUCTIONS FOR DOWNLOADING THE MYCHART APP TO SMARTPHONE  - The patient must first make sure to have activated MyChart and know their login information - If Apple, go to [employer] and type in MyChart in the search bar and download the app. If Android, ask patient to go to [employer] and type in [place] in the search bar and download the app. The app is free but as with any other app downloads, their phone may require them to verify saved payment information  or Apple/Android password.  - The patient will need to then log into the app with their MyChart username and password, and select [facility] as their healthcare provider to link the account. When it is time for your visit, go to the MyChart app, find appointments, and click Begin Video Visit. Be sure to Select Allow for your device to access the Microphone and Camera for your visit. You will then be connected, and your provider will be with you shortly.  **If they have any issues connecting, or need assistance please contact MyChart service desk [phone])**  **If using a computer, in order to ensure the best quality for their visit they will need to use either of the following Internet Browsers: [employer], or Google Chrome**  IF USING DOXIMITY or DOXY.ME - The patient will receive a link just prior to their visit by text.     FULL LENGTH CONSENT FOR TELE-HEALTH VISIT   I hereby voluntarily request, consent and authorize [facility] and its employed or contracted physicians, physician assistants, nurse practitioners or other licensed health care professionals (the Practitioner), to provide me with telemedicine health care services (the "Services") as deemed necessary by the treating Practitioner. I acknowledge and consent to receive the Services by the Practitioner via telemedicine. I understand that the telemedicine visit will involve communicating with the Practitioner through live audiovisual communication technology and the disclosure of certain medical information by electronic transmission. I acknowledge that I have been given the opportunity to request an in-person assessment or other available alternative prior to the telemedicine visit and am voluntarily participating in the telemedicine visit.  I understand that I have the right to withhold or withdraw my consent to the use of telemedicine in the course of my care at any time, without affecting my right to future  care or treatment, and that the Practitioner or I may terminate the telemedicine visit at any time. I understand that I have the right to inspect all information obtained and/or recorded in the course of the telemedicine visit and may receive copies of available information for a reasonable fee.  I understand that some of the potential risks of receiving the Services via telemedicine include:  Marland Kitchen Delay or interruption in medical evaluation due to technological equipment failure or disruption; . Information transmitted may not be sufficient (e.g. poor resolution of images) to allow for appropriate medical decision making by the Practitioner; and/or  . In rare instances, security protocols could fail, causing a breach of personal health information.  Furthermore, I acknowledge that it is my responsibility to provide information about my medical history, conditions and care that is complete and accurate to the best of my ability. I acknowledge that Practitioner's advice, recommendations, and/or decision may be based on factors not within their control, such as incomplete or inaccurate data provided by me or distortions of diagnostic images or specimens that may result  from electronic transmissions. I understand that the practice of medicine is not an exact science and that Practitioner makes no warranties or guarantees regarding treatment outcomes. I acknowledge that I will receive a copy of this consent concurrently upon execution via email to the email address I last provided but may also request a printed copy by calling the office of [facility].    I understand that my insurance will be billed for this visit.   I have read or had this consent read to me. . I understand the contents of this consent, which adequately explains the benefits and risks of the Services being provided via telemedicine.  . I have been provided ample opportunity to ask questions regarding this consent and the Services and have  had my questions answered to my satisfaction. . I give my informed consent for the services to be provided through the use of telemedicine in my medical care  By participating in this telemedicine visit I agree to the above.

## 2018-09-03 DIAGNOSIS — M542 Cervicalgia: Secondary | ICD-10-CM | POA: Diagnosis not present

## 2018-09-03 DIAGNOSIS — M7541 Impingement syndrome of right shoulder: Secondary | ICD-10-CM | POA: Diagnosis not present

## 2018-09-05 ENCOUNTER — Encounter: Payer: Self-pay | Admitting: Cardiology

## 2018-09-05 NOTE — Progress Notes (Signed)
Remote pacemaker transmission.   

## 2018-10-11 DIAGNOSIS — I10 Essential (primary) hypertension: Secondary | ICD-10-CM | POA: Diagnosis not present

## 2018-10-11 DIAGNOSIS — E559 Vitamin D deficiency, unspecified: Secondary | ICD-10-CM | POA: Diagnosis not present

## 2018-10-11 DIAGNOSIS — Z139 Encounter for screening, unspecified: Secondary | ICD-10-CM | POA: Diagnosis not present

## 2018-10-11 DIAGNOSIS — E785 Hyperlipidemia, unspecified: Secondary | ICD-10-CM | POA: Diagnosis not present

## 2018-10-11 DIAGNOSIS — I480 Paroxysmal atrial fibrillation: Secondary | ICD-10-CM | POA: Diagnosis not present

## 2018-10-11 DIAGNOSIS — D638 Anemia in other chronic diseases classified elsewhere: Secondary | ICD-10-CM | POA: Diagnosis not present

## 2018-10-15 ENCOUNTER — Encounter: Payer: Self-pay | Admitting: Neurology

## 2018-10-17 ENCOUNTER — Encounter: Payer: Self-pay | Admitting: Neurology

## 2018-10-17 NOTE — Progress Notes (Deleted)
[facility] Clinic Note - Initial Visit   Date: 10/17/18  [name] MRN: [id number] DOB: 07-10-1931   Dear Dr Delena Bali, Lora Havens, MD:  Thank you for your kind referral of [name] for consultation of ***. Although her history is well known to you, please allow [place] to reiterate it for the purpose of our medical record. The patient was accompanied to the clinic by *** who also provides collateral information.     History of Present Illness: [name] is a 85 y.o. ***-handed female with hypertension, atrial fibrillation, hyperlipidemia, history of subdural hematoma on Coumadin (2008), depression *** presenting for evaluation of right hand numbness.  She had carpal tunnel surgery on February 2020 however there has been no change to her numbness.  She is not formally limited by atrial fibrillation due to intracranial bleed or anticoagulation therapy.  Out-side paper records, electronic medical record, and images have been reviewed where available and summarized as: *** Labs sodium 142, potassium 4.3, creatinine 1.2, GFR 40  No results found for: HGBA1C No results found for: VITAMINB12 No results found for: TSH Lab Results  Component Value Date   ESRSEDRATE 35 (H) 12/26/2010    Past Medical History:  Diagnosis Date  . A-fib ([place])   . Anemia   . Aortic valve disorder   . Arthritis    osteoarthritis - back and knee  . Benign hypertensive heart disease without heart failure   . Blood transfusion   . Cataract    bilateral  . Chronic back pain   . History of stomach ulcers   . Hyperlipidemia   . Hypertension   . Pacemaker   . Rotator cuff tear arthropathy of right shoulder 11/16/2015  . Status post lumbar surgery     Past Surgical History:  Procedure Laterality Date  . ABDOMINAL HYSTERECTOMY    . BACK SURGERY     five back surgeries  . CHOLECYSTECTOMY    . COLONOSCOPY    . CRANIOTOMY     ? subdural hematoma?   Sustained head injury after falling while taking Coumadin  . ESOPHAGOGASTRODUODENOSCOPY    . EYE SURGERY Bilateral    cataract surgery with lens implant  . FRACTURE SURGERY     left wrist, with plate and removal of plate  . INSERT / REPLACE / REMOVE PACEMAKER     [employer]  . KNEE ARTHROPLASTY     right knee  . REVERSE SHOULDER ARTHROPLASTY Right 11/16/2015  . REVERSE SHOULDER ARTHROPLASTY Right 11/16/2015   Procedure: REVERSE SHOULDER ARTHROPLASTY;  Surgeon: Marchia Bond, MD;  Location: [facility];  Service: Orthopedics;  Laterality: Right;  . SHOULDER OPEN ROTATOR CUFF REPAIR  right     Medications:  Outpatient Encounter Medications as of 10/21/2018  Medication Sig  . alendronate (FOSAMAX) 70 MG tablet Take 70 mg by mouth once a week. Take with a full glass of water on an empty stomach.  . ALPRAZolam (XANAX) 0.25 MG tablet Take 0.25 mg by mouth at bedtime as needed for anxiety.  Marland Kitchen aspirin EC 81 MG tablet Take 81 mg by mouth at bedtime.    . Calcium Carb-Cholecalciferol (CALCIUM-VITAMIN D) 500-200 MG-UNIT tablet Take 1 tablet 2 (two) times daily by mouth.  . Coenzyme Q10 100 MG capsule Take 100 mg daily by mouth.   . cyanocobalamin ([employer] VITAMIN B12) 2000 MCG tablet Take 2,000 mcg by mouth daily.  Marland Kitchen diltiazem (CARDIZEM CD) 240 MG 24 hr capsule Take 1 capsule (240 mg  total) by mouth daily.  . famotidine (PEPCID) 40 MG tablet Take 40 mg by mouth daily.  . furosemide (LASIX) 40 MG tablet Take 0.5 tablets (20 mg total) by mouth daily.  Marland Kitchen gabapentin (NEURONTIN) 100 MG capsule Take 100 mg by mouth daily.   Marland Kitchen Kelp 100 MG TABS Take 100 mg by mouth daily.  . meloxicam (MOBIC) 15 MG tablet TK 1 T PO D  . methylPREDNISolone (MEDROL DOSEPAK) 4 MG TBPK tablet FPD  . Multiple Vitamin (MULTIVITAMIN) tablet Take 1 tablet by mouth daily.  . Multiple Vitamins-Minerals (MULTIVITAMIN WITH MINERALS) tablet Take 1 tablet daily by mouth.  . oxyCODONE-acetaminophen (PERCOCET) 10-325 MG tablet Take 1-2 tablets by  mouth every 4 (four) hours as needed for pain. For pain  . pantoprazole (PROTONIX) 40 MG tablet Take 40 mg by mouth daily.  . sennosides-docusate sodium (SENOKOT-S) 8.6-50 MG tablet Take 2 tablets by mouth daily. (Patient taking differently: Take 2 tablets by mouth as needed. )  . triamcinolone (KENALOG) 0.025 % cream APP EXT AA BID  . valsartan (DIOVAN) 320 MG tablet Take 0.5 tablets (160 mg total) by mouth daily.   No facility-administered encounter medications on file as of 10/21/2018.     Allergies:  Allergies  Allergen Reactions  . Amlodipine Anaphylaxis    Swelling of throat with Amlodipine; but does tolerate Tribenzor  . Levofloxacin Anaphylaxis  . Penicillins Hives    Family History: Family History  Problem Relation Age of Onset  . Uterine cancer Mother   . Diabetes Mother   . Alcoholism Father   . Diabetes Brother   . Throat cancer Brother   . Diabetes Brother   . Diabetes Brother     Social History: Social History   Tobacco Use  . Smoking status: Never Smoker  . Smokeless tobacco: Never Used  Substance Use Topics  . Alcohol use: No  . Drug use: No   Social History   Social History Narrative  . Not on file    Review of Systems:  CONSTITUTIONAL: No fevers, chills, night sweats, or weight loss.  *** EYES: No visual changes or eye pain ENT: No hearing changes.  No history of nose bleeds.   RESPIRATORY: No cough, wheezing and shortness of breath.   CARDIOVASCULAR: Negative for chest pain, and palpitations.   GI: Negative for abdominal discomfort, blood in stools or black stools.  No recent change in bowel habits.   GU:  No history of incontinence.   MUSCLOSKELETAL: No history of joint pain or swelling.  No myalgias.   SKIN: Negative for lesions, rash, and itching.   HEMATOLOGY/ONCOLOGY: Negative for prolonged bleeding, bruising easily, and swollen nodes.  No history of cancer.   ENDOCRINE: Negative for cold or heat intolerance, polydipsia or goiter.    PSYCH:  ***depression or anxiety symptoms.   NEURO: As Above.   Vital Signs:  There were no vitals taken for this visit.   General Medical Exam:  *** General:  Well appearing, comfortable.   Eyes/ENT: see cranial nerve examination.   Neck:   No carotid bruits. Respiratory:  Clear to auscultation, good air entry bilaterally.   Cardiac:  Regular rate and rhythm, no murmur.   Extremities:  No deformities, edema, or skin discoloration.  Skin:  No rashes or lesions.  Neurological Exam: MENTAL STATUS including orientation to time, place, person, recent and remote memory, attention span and concentration, language, and fund of knowledge is ***normal.  Speech is not dysarthric.  CRANIAL NERVES: II:  No visual field defects.  Unremarkable fundi.   III-IV-VI: Pupils equal round and reactive to light.  Normal conjugate, extra-ocular eye movements in all directions of gaze.  No nystagmus.  No ptosis***.   V:  Normal facial sensation.    VII:  Normal facial symmetry and movements.   VIII:  Normal hearing and vestibular function.   IX-X:  Normal palatal movement.   XI:  Normal shoulder shrug and head rotation.   XII:  Normal tongue strength and range of motion, no deviation or fasciculation.  MOTOR:  No atrophy, fasciculations or abnormal movements.  No pronator drift.   Upper Extremity:  Right  Left  Deltoid  5/5   5/5   Biceps  5/5   5/5   Triceps  5/5   5/5   Infraspinatus 5/5  5/5  Medial pectoralis 5/5  5/5  Wrist extensors  5/5   5/5   Wrist flexors  5/5   5/5   Finger extensors  5/5   5/5   Finger flexors  5/5   5/5   Dorsal interossei  5/5   5/5   Abductor pollicis  5/5   5/5   Tone (Ashworth scale)  0  0   Lower Extremity:  Right  Left  Hip flexors  5/5   5/5   Hip extensors  5/5   5/5   Adductor 5/5  5/5  Abductor 5/5  5/5  Knee flexors  5/5   5/5   Knee extensors  5/5   5/5   Dorsiflexors  5/5   5/5   Plantarflexors  5/5   5/5   Toe extensors  5/5   5/5   Toe  flexors  5/5   5/5   Tone (Ashworth scale)  0  0   MSRs:  Right        Left                  brachioradialis 2+  2+  biceps 2+  2+  triceps 2+  2+  patellar 2+  2+  ankle jerk 2+  2+  Hoffman no  no  plantar response down  down   SENSORY:  Normal and symmetric perception of light touch, pinprick, vibration, and proprioception.  Romberg's sign absent.   COORDINATION/GAIT: Normal finger-to- nose-finger and heel-to-shin.  Intact rapid alternating movements bilaterally.  Able to rise from a chair without using arms.  Gait narrow based and stable. Tandem and stressed gait intact.    IMPRESSION: ***  PLAN/RECOMMENDATIONS:  *** Return to clinic in *** months.    Thank you for allowing me to participate in patient's care.  If I can answer any additional questions, I would be pleased to do so.    Sincerely,     K. Posey Pronto, DO

## 2018-10-18 ENCOUNTER — Encounter: Payer: Self-pay | Admitting: Neurology

## 2018-10-21 ENCOUNTER — Other Ambulatory Visit: Payer: Self-pay

## 2018-10-21 ENCOUNTER — Encounter: Payer: Self-pay | Admitting: Neurology

## 2018-10-21 ENCOUNTER — Ambulatory Visit (INDEPENDENT_AMBULATORY_CARE_PROVIDER_SITE_OTHER): Payer: Medicare Other | Admitting: Neurology

## 2018-10-21 VITALS — BP 128/70 | HR 78 | Ht 60.0 in | Wt 135.0 lb

## 2018-10-21 DIAGNOSIS — M62521 Muscle wasting and atrophy, not elsewhere classified, right upper arm: Secondary | ICD-10-CM | POA: Diagnosis not present

## 2018-10-21 DIAGNOSIS — M5412 Radiculopathy, cervical region: Secondary | ICD-10-CM | POA: Diagnosis not present

## 2018-10-21 NOTE — Patient Instructions (Signed)
We will request records from Dr. Driscilla Moats office

## 2018-10-21 NOTE — Progress Notes (Signed)
[facility] Clinic Note - Initial Visit   Date: 10/21/18  [name] MRN: [id number] DOB: [birth year]   Dear Dr. Delena Bali:  Thank you for your kind referral of [name] for consultation of right hand numbness. Although her history is well known to you, please allow [place] to reiterate it for the purpose of our medical record. The patient was accompanied to the clinic by self.   History of Present Illness: [name] is a 85 y.o. right-handed female with hypertension, atrial fibrillation s/p PPM, hyperlipidemia, history of subdural hematoma on Coumadin (2008), and depression presenting for evaluation of right hand numbness.  She is very independent and lives alone.  She manages all of her own IADLs and ADLs.  Until last year, she was even mowing her own lawn. Over the past few years, she has noticed weakness of the right upper arm, such that she is unable to raise her arm above her head.  She also has tingling and burning which radiates from the shoulder region into her forearm and hand.  Symptoms are constant and worse in the evening.  She underwent carpal tunnel surgery in February 2020, however there has been no change in her hand numbness/tingling.  She has known shoulder pathology on the right side and sees Dr. Saintclair Halsted.  CT cervical spine and CT shoulder has been performed at their office, however I do not have these results to review.  She had nerve conduction studies performed prior to her carpal tunnel surgery.  Out-side paper records, electronic medical record, and images have been reviewed where available and summarized as:  Labs sodium 142, potassium 4.3, creatinine 1.2, GFR 40  Lab Results  Component Value Date   ESRSEDRATE 35 (H) 12/26/2010    Past Medical History:  Diagnosis Date  . A-fib ([place])   . Anemia   . Aortic valve disorder   . Arthritis    osteoarthritis - back and knee  . Benign hypertensive heart disease without heart  failure   . Blood transfusion   . Cataract    bilateral  . Chronic back pain   . Diverticulosis   . GERD (gastroesophageal reflux disease)   . History of stomach ulcers   . Hyperlipidemia   . Hypertension   . Osteoarthrosis   . Pacemaker   . Rotator cuff tear arthropathy of right shoulder 11/16/2015  . Status post lumbar surgery   . Vitamin D deficiency     Past Surgical History:  Procedure Laterality Date  . ABDOMINAL HYSTERECTOMY    . APPENDECTOMY    . BACK SURGERY     five back surgeries  . cataract extaction -left    . cataract extraction right    . CHOLECYSTECTOMY    . COLONOSCOPY    . CRANIOTOMY     ? subdural hematoma?  Sustained head injury after falling while taking Coumadin  . ESOPHAGOGASTRODUODENOSCOPY    . EYE SURGERY Bilateral    cataract surgery with lens implant  . FRACTURE SURGERY     left wrist, with plate and removal of plate  . INSERT / REPLACE / REMOVE PACEMAKER     Medtronic  . KNEE ARTHROPLASTY     right knee  . REVERSE SHOULDER ARTHROPLASTY Right 11/16/2015  . REVERSE SHOULDER ARTHROPLASTY Right 11/16/2015   Procedure: REVERSE SHOULDER ARTHROPLASTY;  Surgeon: Marchia Bond, MD;  Location: [place];  Service: Orthopedics;  Laterality: Right;  . S1 vertebroplasty    . SHOULDER OPEN ROTATOR  CUFF REPAIR  right     Medications:  Outpatient Encounter Medications as of 10/21/2018  Medication Sig  . alendronate (FOSAMAX) 70 MG tablet Take 70 mg by mouth once a week. Take with a full glass of water on an empty stomach.  . ALPRAZolam (XANAX) 0.25 MG tablet Take 0.25 mg by mouth at bedtime as needed for anxiety.  Marland Kitchen aspirin EC 81 MG tablet Take 81 mg by mouth at bedtime.    . Calcium Carb-Cholecalciferol (CALCIUM-VITAMIN D) 500-200 MG-UNIT tablet Take 1 tablet 2 (two) times daily by mouth.  . Coenzyme Q10 100 MG capsule Take 100 mg daily by mouth.   . cyanocobalamin (CVS VITAMIN B12) 2000 MCG tablet Take 2,000 mcg by mouth daily.  Marland Kitchen diltiazem (CARDIZEM CD)  240 MG 24 hr capsule Take 1 capsule (240 mg total) by mouth daily.  . famotidine (PEPCID) 40 MG tablet Take 40 mg by mouth daily.  . furosemide (LASIX) 40 MG tablet Take 0.5 tablets (20 mg total) by mouth daily.  Marland Kitchen gabapentin (NEURONTIN) 100 MG capsule Take 100 mg by mouth daily.   Marland Kitchen Kelp 100 MG TABS Take 100 mg by mouth daily.  . meloxicam (MOBIC) 15 MG tablet TK 1 T PO D  . methylPREDNISolone (MEDROL DOSEPAK) 4 MG TBPK tablet FPD  . Multiple Vitamin (MULTIVITAMIN) tablet Take 1 tablet by mouth daily.  . Multiple Vitamins-Minerals (MULTIVITAMIN WITH MINERALS) tablet Take 1 tablet daily by mouth.  . Oxycodone HCl 10 MG TABS TK 1 TO 2 TS PO Q 4 TO 6 H  . pantoprazole (PROTONIX) 40 MG tablet Take 40 mg by mouth daily.  . sennosides-docusate sodium (SENOKOT-S) 8.6-50 MG tablet Take 2 tablets by mouth daily. (Patient taking differently: Take 2 tablets by mouth as needed. )  . sulfamethoxazole-trimethoprim (BACTRIM DS) 800-160 MG tablet TK 1 T PO BID WF FOR BOIL ON ARM  . triamcinolone (KENALOG) 0.025 % cream APP EXT AA BID  . valsartan (DIOVAN) 320 MG tablet Take 0.5 tablets (160 mg total) by mouth daily.  . [DISCONTINUED] oxyCODONE-acetaminophen (PERCOCET) 10-325 MG tablet Take 1-2 tablets by mouth every 4 (four) hours as needed for pain. For pain   No facility-administered encounter medications on file as of 10/21/2018.     Allergies:  Allergies  Allergen Reactions  . Amlodipine Anaphylaxis    Swelling of throat with Amlodipine; but does tolerate Tribenzor  . Levofloxacin Anaphylaxis  . Penicillins Hives    Family History: Family History  Problem Relation Age of Onset  . Uterine cancer Mother   . Diabetes Mother   . Alcoholism Father   . Diabetes Brother   . Throat cancer Brother   . Diabetes Brother   . Diabetes Brother     Social History: Social History   Tobacco Use  . Smoking status: Never Smoker  . Smokeless tobacco: Never Used  Substance Use Topics  . Alcohol use: No   . Drug use: No   Social History   Social History Narrative   Lives alone , One story   Right handed   She retired as a Regulatory affairs officer.     Review of Systems:  CONSTITUTIONAL: No fevers, chills, night sweats, or weight loss.  No EYES: No visual changes or eye pain ENT: No hearing changes.  No history of nose bleeds.   RESPIRATORY: No cough, wheezing and shortness of breath.   CARDIOVASCULAR: Negative for chest pain, and palpitations.   GI: Negative for abdominal discomfort, blood in stools or black  stools.  No recent change in bowel habits.   GU:  No history of incontinence.   MUSCLOSKELETAL: No history of joint pain or swelling.  No myalgias.   SKIN: Negative for lesions, rash, and itching.   HEMATOLOGY/ONCOLOGY: Negative for prolonged bleeding, bruising easily, and swollen nodes.  No history of cancer.   ENDOCRINE: Negative for cold or heat intolerance, polydipsia or goiter.   PSYCH:  No depression or anxiety symptoms.   NEURO: As Above.   Vital Signs:  BP 128/70   Pulse 78   Ht 5' (1.524 m)   Wt 135 lb (61.2 kg)   SpO2 95%   BMI 26.37 kg/m    General Medical Exam:   General:  Elderly appearing, comfortable.   Eyes/ENT: see cranial nerve examination.   Neck:   No carotid bruits. Respiratory:  Clear to auscultation, good air entry bilaterally.   Cardiac:  Regular rate and rhythm, no murmur.   Extremities:  ? Right deltoid muscle tear noted by loss of muscle bulk in the right deltoid and soft tissue mass below muscle.  Skin:  No rashes or lesions.  Neurological Exam: MENTAL STATUS including orientation to time, place, person, recent and remote memory, attention span and concentration, language, and fund of knowledge is normal.  Speech is not dysarthric.  CRANIAL NERVES: II:  No visual field defects. .   III-IV-VI: Pupils equal round and reactive to light.  Normal conjugate, extra-ocular eye movements in all directions of gaze.  No nystagmus.  No ptosis.   V:  Normal  facial sensation.    VII:  Normal facial symmetry and movements.   VIII:  Normal hearing and vestibular function.   IX-X:  Normal palatal movement.   XI:  Normal shoulder shrug and head rotation.   XII:  Normal tongue strength and range of motion, no deviation or fasciculation.  MOTOR:  Severe R deltoid muscle atrophy and soft tissue mass distal to atrophy (contracted muscle).  No fasciculations or abnormal movements.  No pronator drift.   Upper Extremity:  Right  Left  Deltoid  4/5   5/5   Biceps  5/5   5/5   Triceps  5/5   5/5   Infraspinatus 4/5  5/5  Medial pectoralis 5/5  5/5  Wrist extensors  5/5   5/5   Wrist flexors  5/5   5/5   Finger extensors  5/5   5/5   Finger flexors  5/5   5/5   Dorsal interossei  5/5   5/5   Abductor pollicis  5/5   5/5   Tone (Ashworth scale)  0  0   Lower Extremity:  Right  Left  Hip flexors  5/5   5/5   Hip extensors  5/5   5/5   Adductor 5/5  5/5  Abductor 5/5  5/5  Knee flexors  5/5   5/5   Knee extensors  5/5   5/5   Dorsiflexors  5/5   5/5   Plantarflexors  5/5   5/5   Toe extensors  5/5   5/5   Toe flexors  5/5   5/5   Tone (Ashworth scale)  0  0   MSRs:  Right        Left                  brachioradialis 2+  2+  biceps 1+  2+  triceps 1+  2+  patellar 2+  2+  ankle jerk  0  0  Hoffman no  no  plantar response down  down   SENSORY:  Normal and symmetric perception of light touch, pinprick, vibration, and proprioception.    COORDINATION/GAIT: Normal finger-to- nose-finger and heel-to-shin.  Intact rapid alternating movements bilaterally. Stooped posture with assistance with walker, slow and stable.  IMPRESSION: Right proximal arm weakness/muscle atrophy especially over deltoid and infraspinatus muscles, concerning for C5 radiculopathy. I will request CT cervical spine and NCS/EMG reports from Dr. Windy Carina office to review.  Based on these results, she may need repeat NCS/EMG of the arm.   She is unable to get MRI due to PPM.     Thank you for allowing me to participate in patient's care.  If I can answer any additional questions, I would be pleased to do so.    Sincerely,    Donika K. Posey Pronto, DO

## 2018-11-05 ENCOUNTER — Telehealth: Payer: Self-pay | Admitting: Neurology

## 2018-11-05 NOTE — Telephone Encounter (Signed)
[facility] HIM Dept faxed a request for medical records to Dr. Kary Kos [facility] Neurosurgery [phone] 11/05/18  [facility]

## 2018-11-08 ENCOUNTER — Other Ambulatory Visit: Payer: Self-pay

## 2018-11-08 ENCOUNTER — Encounter: Payer: Self-pay | Admitting: Cardiology

## 2018-11-08 ENCOUNTER — Telehealth (INDEPENDENT_AMBULATORY_CARE_PROVIDER_SITE_OTHER): Payer: Medicare Other | Admitting: Cardiology

## 2018-11-08 VITALS — Wt 130.0 lb

## 2018-11-08 DIAGNOSIS — I420 Dilated cardiomyopathy: Secondary | ICD-10-CM

## 2018-11-08 DIAGNOSIS — I480 Paroxysmal atrial fibrillation: Secondary | ICD-10-CM | POA: Diagnosis not present

## 2018-11-08 DIAGNOSIS — Z950 Presence of cardiac pacemaker: Secondary | ICD-10-CM

## 2018-11-08 DIAGNOSIS — I10 Essential (primary) hypertension: Secondary | ICD-10-CM

## 2018-11-08 NOTE — Patient Instructions (Signed)
Medication Instructions:  Your physician recommends that you continue on your current medications as directed. Please refer to the Current Medication list given to you today.  If you need a refill on your cardiac medications before your next appointment, please call your pharmacy.   Lab work: NONE  If you have labs (blood work) drawn today and your tests are completely normal, you will receive your results only by: . MyChart Message (if you have MyChart) OR . A paper copy in the mail If you have any lab test that is abnormal or we need to change your treatment, we will call you to review the results.  Testing/Procedures: NONE  Follow-Up: At [facility], you and your health needs are our priority.  As part of our continuing mission to provide you with exceptional heart care, we have created designated Provider Care Teams.  These Care Teams include your primary Cardiologist (physician) and Advanced Practice Providers (APPs -  Physician Assistants and Nurse Practitioners) who all work together to provide you with the care you need, when you need it. You will need a follow up appointment in 3 months.  Please call our office 2 months in advance to schedule this appointment.  You may see No primary care provider on file. or another member of our [facility] Provider Team in [place]: Brian Munley, MD . Rajan Revankar, MD  Any Other Special Instructions Will Be Listed Below (If Applicable).    

## 2018-11-08 NOTE — Progress Notes (Signed)
Virtual Visit via Telephone Note   This visit type was conducted due to national recommendations for restrictions regarding the COVID-19 Pandemic (e.g. social distancing) in an effort to limit this patient's exposure and mitigate transmission in our community.  Due to her co-morbid illnesses, this patient is at least at moderate risk for complications without adequate follow up.  This format is felt to be most appropriate for this patient at this time.  The patient did not have access to video technology/had technical difficulties with video requiring transitioning to audio format only (telephone).  All issues noted in this document were discussed and addressed.  No physical exam could be performed with this format.  Please refer to the patient's chart for her  consent to telehealth for [facility].  Evaluation Performed:  Follow-up visit  This visit type was conducted due to national recommendations for restrictions regarding the COVID-19 Pandemic (e.g. social distancing).  This format is felt to be most appropriate for this patient at this time.  All issues noted in this document were discussed and addressed.  No physical exam was performed (except for noted visual exam findings with Video Visits).  Please refer to the patient's chart (MyChart message for video visits and phone note for telephone visits) for the patient's consent to telehealth for [facility].  Date:  11/08/2018  ID: [name], DOB 1931/04/29, MRN [id number]   Patient Location: [place] [address]   Provider location:   [place] Office  PCP:  Nicoletta Dress, MD  Cardiologist:  Jenne Campus, MD     Chief Complaint: Doing well  History of Present Illness:    [name] is a 85 y.o. female  who presents via audio/video conferencing for a telehealth visit today.  Paroxysmal atrial fibrillation, pacemaker implantation, cardiomyopathy with ejection fraction 4550%.   Overall she is doing very well she is busy taking care of her family.  Apparently her son required open heart surgery that will be done in [facility] next week.  She is very worried about it denies having any cardiac complaints.  Swelling on her lower extremities is gone.  She still very active.   The patient does not have symptoms concerning for COVID-19 infection (fever, chills, cough, or new SHORTNESS OF BREATH).    Prior CV studies:   The following studies were reviewed today:       Past Medical History:  Diagnosis Date  . A-fib ([place])   . Anemia   . Aortic valve disorder   . Arthritis    osteoarthritis - back and knee  . Benign hypertensive heart disease without heart failure   . Blood transfusion   . Cataract    bilateral  . Chronic back pain   . Diverticulosis   . GERD (gastroesophageal reflux disease)   . History of stomach ulcers   . Hyperlipidemia   . Hypertension   . Osteoarthrosis   . Pacemaker   . Rotator cuff tear arthropathy of right shoulder 11/16/2015  . Status post lumbar surgery   . Vitamin D deficiency     Past Surgical History:  Procedure Laterality Date  . ABDOMINAL HYSTERECTOMY    . APPENDECTOMY    . BACK SURGERY     five back surgeries  . cataract extaction -left    . cataract extraction right    . CHOLECYSTECTOMY    . COLONOSCOPY    . CRANIOTOMY     ?  subdural hematoma?  Sustained head injury after falling while taking Coumadin  . ESOPHAGOGASTRODUODENOSCOPY    . EYE SURGERY Bilateral    cataract surgery with lens implant  . FRACTURE SURGERY     left wrist, with plate and removal of plate  . INSERT / REPLACE / REMOVE PACEMAKER     Medtronic  . KNEE ARTHROPLASTY     right knee  . REVERSE SHOULDER ARTHROPLASTY Right 11/16/2015  . REVERSE SHOULDER ARTHROPLASTY Right 11/16/2015   Procedure: REVERSE SHOULDER ARTHROPLASTY;  Surgeon: Marchia Bond, MD;  Location: [place];  Service: Orthopedics;  Laterality: Right;  . S1  vertebroplasty    . SHOULDER OPEN ROTATOR CUFF REPAIR  right     Current Meds  Medication Sig  . alendronate (FOSAMAX) 70 MG tablet Take 70 mg by mouth once a week. Take with a full glass of water on an empty stomach.  Marland Kitchen aspirin EC 81 MG tablet Take 81 mg by mouth at bedtime.    . Calcium Carb-Cholecalciferol (CALCIUM-VITAMIN D) 500-200 MG-UNIT tablet Take 1 tablet 2 (two) times daily by mouth.  . Coenzyme Q10 100 MG capsule Take 100 mg daily by mouth.   . cyanocobalamin (CVS VITAMIN B12) 2000 MCG tablet Take 2,000 mcg by mouth daily.  Marland Kitchen diltiazem (CARDIZEM CD) 240 MG 24 hr capsule Take 1 capsule (240 mg total) by mouth daily.  . famotidine (PEPCID) 40 MG tablet Take 40 mg by mouth daily.  . furosemide (LASIX) 40 MG tablet Take 0.5 tablets (20 mg total) by mouth daily.  Marland Kitchen gabapentin (NEURONTIN) 100 MG capsule Take 100 mg by mouth daily.   Marland Kitchen Kelp 100 MG TABS Take 100 mg by mouth daily.  . meloxicam (MOBIC) 15 MG tablet TK 1 T PO D  . Multiple Vitamin (MULTIVITAMIN) tablet Take 1 tablet by mouth daily.  . Oxycodone HCl 10 MG TABS TK 1 TO 2 TS PO Q 4 TO 6 H  . pantoprazole (PROTONIX) 40 MG tablet Take 40 mg by mouth daily.  . sennosides-docusate sodium (SENOKOT-S) 8.6-50 MG tablet Take 2 tablets by mouth daily. (Patient taking differently: Take 2 tablets by mouth as needed. )  . triamcinolone (KENALOG) 0.025 % cream APP EXT AA BID  . valsartan (DIOVAN) 320 MG tablet Take 0.5 tablets (160 mg total) by mouth daily.      Family History: The patient's family history includes Alcoholism in her father; Diabetes in her brother, brother, brother, and mother; Throat cancer in her brother; Uterine cancer in her mother.   ROS:   Please see the history of present illness.     All other systems reviewed and are negative.   Labs/Other Tests and Data Reviewed:     Recent Labs: 08/21/2018: BUN 24; Creatinine, Ser 1.09; Potassium 4.6; Sodium 143  Recent Lipid Panel No results found for: CHOL,  TRIG, HDL, CHOLHDL, VLDL, LDLCALC, LDLDIRECT    Exam:    Vital Signs:  Wt 130 lb (59 kg)   BMI 25.39 kg/m     Wt Readings from Last 3 Encounters:  11/08/18 130 lb (59 kg)  10/18/18 135 lb (61.2 kg)  07/31/18 135 lb (61.2 kg)     Well nourished, well developed in no acute distress. Alert awake in exam 3 talking to me over the phone unable to establish video link.  Not in any distress  Diagnosis for this visit:   1. Paroxysmal atrial fibrillation (HCC)   2. Dilated cardiomyopathy ([place])   3. Pacemaker   4.  Essential hypertension      ASSESSMENT & PLAN:    1.  Paroxysmal atrial fibrillation denies having any palpitations.  Now it looks like more like a permanent atrial fibrillation.  She does not want to take any anticoagulation.  Rate is controlled she feels good. 2.  Dilated cardiomyopathy she is a difficult patient went up with her new medication for clinically doing well. When I see her we will repeat her echocardiogram. 3.  Pacemaker present recent normal function continue present management. 4.  Essential hypertension blood pressure well controlled we will continue present management  COVID-19 Education: The signs and symptoms of COVID-19 were discussed with the patient and how to seek care for testing (follow up with PCP or arrange E-visit).  The importance of social distancing was discussed today.  Patient Risk:   After full review of this patients clinical status, I feel that they are at least moderate risk at this time.  Time:   Today, I have spent 15 minutes with the patient with telehealth technology discussing pt health issues.  I spent 5 minutes reviewing her chart before the visit.  Visit was finished at 9:05 AM.    Medication Adjustments/Labs and Tests Ordered: Current medicines are reviewed at length with the patient today.  Concerns regarding medicines are outlined above.  No orders of the defined types were placed in this encounter.  Medication  changes: No orders of the defined types were placed in this encounter.    Disposition: Follow-up in 3 months  Signed, Park Liter, MD, [facility] 11/08/2018 9:01 AM    [place]

## 2018-11-11 DIAGNOSIS — I10 Essential (primary) hypertension: Secondary | ICD-10-CM | POA: Diagnosis not present

## 2018-11-11 DIAGNOSIS — I480 Paroxysmal atrial fibrillation: Secondary | ICD-10-CM | POA: Diagnosis not present

## 2018-11-11 DIAGNOSIS — M5412 Radiculopathy, cervical region: Secondary | ICD-10-CM | POA: Diagnosis not present

## 2018-11-11 DIAGNOSIS — G5691 Unspecified mononeuropathy of right upper limb: Secondary | ICD-10-CM | POA: Diagnosis not present

## 2018-11-14 NOTE — Telephone Encounter (Signed)
-----   Message from Alda Berthold, DO sent at 11/14/2018 10:26 AM EDT ----- Please inform patient that I have reviewed her prior testing at Dr. Windy Carina office and I recommend repeat nerve testing of the right arm.  If she is agreeable, please order and schedule EMG of the right upper extremity - note brachial plexus protocol in comments. Thank you.

## 2018-11-14 NOTE — Telephone Encounter (Signed)
Patient does want to do the EMG, will schedule.

## 2018-11-14 NOTE — Progress Notes (Signed)
Addendum  Records received from neurosurgery as summarized below:  CT right upper extremity 08/21/2018: 1.  Prior reversal shoulder arthroplasty with subacute nondisplaced scapular spine fracture and suspected chronic displaced acromion fracture 2.  Rotator cuff muscle atrophy  CT cervical spine 08/02/2018: 1.  No acute osseous abnormality in the cervical spine. 2.  Widespread cervical spine degeneration.  Spinal stenosis suspected at C3-4 through C6-7 with probable spinal cord mass-effect at several levels.  Associated moderate to severe neuroforaminal stenosis and bilateral C4-C8 nerve levels. 3.  Stable visualized upper thoracic spine.  NCS/EMG 04/02/2018: Absent right median sensory and prolonged median motor (6.0 ms) and reduced amplitude (54mV).  Normal right radial sensory and ulnar sensory responses.  No EMG abnormalities are noted.  Clinic note by Dr. Dominica Severin cram dated 08/20/2018 suggests ongoing numbness and tingling in the right arm around the shoulder with increased weakness and laxity as well as reduced range of motion around the shoulder.  There was concern shoulder impingement or brachial plexopathy.   She underwent CTS release in early 2020 with ongoing arm paresthesias.  I recommend repeat electrodiagnostic testing of the right upper extremity to evaluate for brachial plexopathy versus cervical radiculopathy.  Eivin Mascio K. Posey Pronto, DO

## 2018-11-28 ENCOUNTER — Encounter: Payer: Medicare Other | Admitting: *Deleted

## 2018-12-03 DIAGNOSIS — M7541 Impingement syndrome of right shoulder: Secondary | ICD-10-CM | POA: Diagnosis not present

## 2018-12-03 DIAGNOSIS — M542 Cervicalgia: Secondary | ICD-10-CM | POA: Diagnosis not present

## 2018-12-03 DIAGNOSIS — M5440 Lumbago with sciatica, unspecified side: Secondary | ICD-10-CM | POA: Diagnosis not present

## 2018-12-03 DIAGNOSIS — Z6825 Body mass index (BMI) 25.0-25.9, adult: Secondary | ICD-10-CM | POA: Diagnosis not present

## 2018-12-03 DIAGNOSIS — I10 Essential (primary) hypertension: Secondary | ICD-10-CM | POA: Diagnosis not present

## 2018-12-12 ENCOUNTER — Other Ambulatory Visit: Payer: Self-pay

## 2018-12-12 DIAGNOSIS — M5412 Radiculopathy, cervical region: Secondary | ICD-10-CM

## 2018-12-12 DIAGNOSIS — R202 Paresthesia of skin: Secondary | ICD-10-CM

## 2018-12-13 ENCOUNTER — Ambulatory Visit (INDEPENDENT_AMBULATORY_CARE_PROVIDER_SITE_OTHER): Payer: Medicare Other | Admitting: *Deleted

## 2018-12-13 DIAGNOSIS — I480 Paroxysmal atrial fibrillation: Secondary | ICD-10-CM | POA: Diagnosis not present

## 2018-12-13 LAB — CUP PACEART REMOTE DEVICE CHECK
Battery Impedance: 2367 Ohm
Battery Remaining Longevity: 34 mo
Battery Voltage: 2.74 V
Brady Statistic RV Percent Paced: 13 %
Date Time Interrogation Session: 20200924222614
Implantable Lead Implant Date: 20120911
Implantable Lead Implant Date: 20120911
Implantable Lead Location: 753859
Implantable Lead Location: 753860
Implantable Lead Model: 4092
Implantable Lead Model: 5076
Implantable Pulse Generator Implant Date: 20120911
Lead Channel Impedance Value: 67 Ohm
Lead Channel Impedance Value: 694 Ohm
Lead Channel Pacing Threshold Amplitude: 0.625 V
Lead Channel Pacing Threshold Pulse Width: 0.4 ms
Lead Channel Setting Pacing Amplitude: 2 V
Lead Channel Setting Pacing Pulse Width: 0.4 ms
Lead Channel Setting Sensing Sensitivity: 5.6 mV

## 2018-12-18 ENCOUNTER — Telehealth: Payer: Self-pay | Admitting: Cardiology

## 2018-12-18 NOTE — Telephone Encounter (Signed)
Called and LMOVM in regards to the letter the patient sent about her remote follow up dates.

## 2018-12-18 NOTE — Progress Notes (Signed)
Remote pacemaker transmission.   

## 2018-12-19 NOTE — Telephone Encounter (Signed)
I spoke with the pt to let her know we received her letter. I told her the next transmission will be on 03/17/2019 and I will send out a letter to let her know when her transmissions is due. The pt verbalized understanding.

## 2018-12-24 ENCOUNTER — Ambulatory Visit (INDEPENDENT_AMBULATORY_CARE_PROVIDER_SITE_OTHER): Payer: Medicare Other | Admitting: Neurology

## 2018-12-24 ENCOUNTER — Other Ambulatory Visit: Payer: Self-pay

## 2018-12-24 DIAGNOSIS — M5412 Radiculopathy, cervical region: Secondary | ICD-10-CM

## 2018-12-24 DIAGNOSIS — R202 Paresthesia of skin: Secondary | ICD-10-CM | POA: Diagnosis not present

## 2018-12-24 DIAGNOSIS — G5601 Carpal tunnel syndrome, right upper limb: Secondary | ICD-10-CM

## 2018-12-24 NOTE — Procedures (Signed)
[facility]  [place], [address]  [place], [place] [address] Tel: [phone] Fax:  [phone] Test Date:  12/24/2018  Patient: [name] DOB: June 04, 1931 Physician: Narda Amber, DO  Sex: Female Height: 5\' 0"  Ref Phys: Narda Amber, DO  ID#: [id number] Temp: 32.0C Technician:    Patient Complaints: This is an 85 year old female referred for evaluation of proximal right arm weakness and muscle atrophy.  NCV & EMG Findings: Extensive electrodiagnostic testing of the right upper extremity shows: 1. Right radial and ulnar sensory responses are within normal limits. 2. Right median motor response is absent.  Right ulnar motor responses are within normal limits.   3. Severe chronic motor axonal loss changes are seen affecting the pronator teres, biceps, deltoid, and abductor pollicis brevis muscles.  There is evidence of fibrillation potentials involving the cervical paraspinal muscles.  Impression: 1. Active on chronic C6 radiculopathy affecting the right upper extremity, very severe. 2. Right median neuropathy at or distal to the wrist (severe), consistent with a clinical diagnosis of carpal tunnel syndrome.     ___________________________ Narda Amber, DO    Nerve Conduction Studies Anti Sensory Summary Table   Site NR Peak (ms) Norm Peak (ms) P-T Amp (V) Norm P-T Amp  Right Median Anti Sensory (2nd Digit)  32C  Wrist NR  <3.8  >10  Right Radial Anti Sensory (Base 1st Digit)  32C  Wrist    1.9 <2.8 17.7 >10  Right Ulnar Anti Sensory (5th Digit)  32C  Wrist    3.2 <3.2 10.5 >5   Motor Summary Table   Site NR Onset (ms) Norm Onset (ms) O-P Amp (mV) Norm O-P Amp Site1 Site2 Delta-0 (ms) Dist (cm) Vel (m/s) Norm Vel (m/s)  Right Median Motor (Abd Poll Brev)  32C  Wrist NR  <4.0  >5 Elbow Wrist  0.0  >50  Elbow NR            Right Ulnar Motor (Abd Dig Minimi)  32C  Wrist    2.7 <3.1 10.7 >7 B Elbow Wrist 4.3 24.0 56 >50  B Elbow    7.0  9.7  A  Elbow B Elbow 1.9 10.0 53 >50  A Elbow    8.9  9.1          EMG   Side Muscle Ins Act Fibs Psw Fasc Number Recrt Dur Dur. Amp Amp. Poly Poly. Comment  Right 1stDorInt Nml Nml Nml Nml Nml Nml Nml Nml Nml Nml Nml Nml N/A  Right PronatorTeres Nml Nml Nml Nml 1- Rapid Many 1+ Many 1+ Some 1+ N/A  Right Biceps Nml Nml Nml Nml 3- Rapid All 1+ All 1+ All 1+ N/A  Right Triceps Nml Nml Nml Nml Nml Nml Nml Nml Nml Nml Nml Nml N/A  Right Abd Poll Brev Nml Nml Nml Nml SMU Rapid All 1+ All 1+ All 1+ ATR  Right Deltoid Nml Nml Nml Nml SMU Rapid All 1+ All 1+ All 1+ ATR  Right Infraspinatus Nml Nml Nml Nml Nml Nml Nml Nml Nml Nml Nml Nml N/A  Right Cervical Parasp Low CRD 1+ Nml Nml NE - - - - - - - N/A      Waveforms:

## 2018-12-26 ENCOUNTER — Telehealth: Payer: Self-pay | Admitting: Neurology

## 2018-12-26 NOTE — Telephone Encounter (Signed)
Patient called back and I informed her that NCS/EMG shows very severe and active C6 radiculopathy which is contributing to the severity of her proximal arm weakness.  She will follow-up with Dr. Saintclair Halsted next week to discuss management options.

## 2018-12-26 NOTE — Telephone Encounter (Signed)
Called patient to discuss results of EMG, but there was no answer.  Message left on voicemail to call back.

## 2018-12-28 DIAGNOSIS — Z23 Encounter for immunization: Secondary | ICD-10-CM | POA: Diagnosis not present

## 2019-01-02 DIAGNOSIS — M5412 Radiculopathy, cervical region: Secondary | ICD-10-CM | POA: Diagnosis not present

## 2019-01-02 DIAGNOSIS — G5600 Carpal tunnel syndrome, unspecified upper limb: Secondary | ICD-10-CM | POA: Diagnosis not present

## 2019-01-27 ENCOUNTER — Ambulatory Visit (INDEPENDENT_AMBULATORY_CARE_PROVIDER_SITE_OTHER): Payer: Medicare Other | Admitting: Cardiology

## 2019-01-27 ENCOUNTER — Encounter: Payer: Self-pay | Admitting: Cardiology

## 2019-01-27 ENCOUNTER — Other Ambulatory Visit: Payer: Self-pay

## 2019-01-27 VITALS — BP 162/74 | HR 82 | Ht 60.0 in | Wt 129.0 lb

## 2019-01-27 DIAGNOSIS — I4821 Permanent atrial fibrillation: Secondary | ICD-10-CM

## 2019-01-27 NOTE — Patient Instructions (Signed)
Medication Instructions:  Your physician recommends that you continue on your current medications as directed. Please refer to the Current Medication list given to you today.  *If you need a refill on your cardiac medications before your next appointment, please call your pharmacy*  Labwork: None ordered If you have labs (blood work) drawn today and your tests are completely normal, you will receive your results only by:  Door (if you have MyChart) OR  A paper copy in the mail If you have any lab test that is abnormal or we need to change your treatment, we will call you to review the results.  Testing/Procedures: None ordered  Follow-Up: Remote monitoring is used to monitor your Pacemaker or ICD from home. This monitoring reduces the number of office visits required to check your device to one time per year. It allows [place] to keep an eye on the functioning of your device to ensure it is working properly. You are scheduled for a device check from home on 03/17/19. You may send your transmission at any time that day. If you have a wireless device, the transmission will be sent automatically. After your physician reviews your transmission, you will receive a postcard with your next transmission date.  Your physician wants you to follow-up in: 1 year with Dr. Curt Bears.  You will receive a reminder letter in the mail two months in advance. If you don't receive a letter, please call our office to schedule the follow-up appointment.   Thank you for choosing [facility]!!   Trinidad Curet, RN [phone]  Any Other Special Instructions Will Be Listed Below (If Applicable).

## 2019-01-27 NOTE — Progress Notes (Signed)
Electrophysiology Office Note   Date:  01/27/2019   ID:  Afnan, Mancinelli 1931-05-25, MRN [id number]  PCP:  Nicoletta Dress, MD  Cardiologist:  Agustin Cree Primary Electrophysiologist:  Nivia Gervase Meredith Leeds, MD    Chief Complaint: pacemaker   History of Present Illness: [name] is a 85 y.o. female who is being seen today for the evaluation of pacemaker at the request of Nicoletta Dress, MD. Presenting today for electrophysiology evaluation.  She has a history significant for hypertension, hyperlipidemia, and permanent atrial fibrillation.  She is status post dual-chamber Medtronic pacemaker.  Today, she denies symptoms of palpitations, chest pain, shortness of breath, orthopnea, PND, lower extremity edema, claudication, dizziness, presyncope, syncope, bleeding, or neurologic sequela. The patient is tolerating medications without difficulties.    Past Medical History:  Diagnosis Date  . A-fib ([place])   . Anemia   . Aortic valve disorder   . Arthritis    osteoarthritis - back and knee  . Benign hypertensive heart disease without heart failure   . Blood transfusion   . Cataract    bilateral  . Chronic back pain   . Diverticulosis   . GERD (gastroesophageal reflux disease)   . History of stomach ulcers   . Hyperlipidemia   . Hypertension   . Osteoarthrosis   . Pacemaker   . Rotator cuff tear arthropathy of right shoulder 11/16/2015  . Status post lumbar surgery   . Vitamin D deficiency    Past Surgical History:  Procedure Laterality Date  . ABDOMINAL HYSTERECTOMY    . APPENDECTOMY    . BACK SURGERY     five back surgeries  . cataract extaction -left    . cataract extraction right    . CHOLECYSTECTOMY    . COLONOSCOPY    . CRANIOTOMY     ? subdural hematoma?  Sustained head injury after falling while taking Coumadin  . ESOPHAGOGASTRODUODENOSCOPY    . EYE SURGERY Bilateral    cataract surgery with lens implant  . FRACTURE SURGERY     left wrist,  with plate and removal of plate  . INSERT / REPLACE / REMOVE PACEMAKER     Medtronic  . KNEE ARTHROPLASTY     right knee  . REVERSE SHOULDER ARTHROPLASTY Right 11/16/2015  . REVERSE SHOULDER ARTHROPLASTY Right 11/16/2015   Procedure: REVERSE SHOULDER ARTHROPLASTY;  Surgeon: Marchia Bond, MD;  Location: [facility];  Service: Orthopedics;  Laterality: Right;  . S1 vertebroplasty    . SHOULDER OPEN ROTATOR CUFF REPAIR  right     Current Outpatient Medications  Medication Sig Dispense Refill  . alendronate (FOSAMAX) 70 MG tablet Take 70 mg by mouth once a week. Take with a full glass of water on an empty stomach.    Marland Kitchen aspirin EC 81 MG tablet Take 81 mg by mouth at bedtime.      . Calcium Carb-Cholecalciferol (CALCIUM-VITAMIN D) 500-200 MG-UNIT tablet Take 1 tablet 2 (two) times daily by mouth.    . Coenzyme Q10 100 MG capsule Take 100 mg daily by mouth.     . cyanocobalamin ([employer]S VITAMIN B12) 2000 MCG tablet Take 2,000 mcg by mouth daily.    Marland Kitchen diltiazem (CARDIZEM CD) 240 MG 24 hr capsule Take 1 capsule (240 mg total) by mouth daily. 90 capsule 1  . famotidine (PEPCID) 40 MG tablet Take 40 mg by mouth daily.  0  . furosemide (LASIX) 40 MG tablet Take 0.5 tablets (20 mg total) by mouth daily.  30 tablet 1  . gabapentin (NEURONTIN) 100 MG capsule Take 100 mg by mouth daily.     Marland Kitchen Kelp 100 MG TABS Take 100 mg by mouth daily.    . meloxicam (MOBIC) 15 MG tablet TK 1 T PO D    . Multiple Vitamin (MULTIVITAMIN) tablet Take 1 tablet by mouth daily.    . Oxycodone HCl 10 MG TABS TK 1 TO 2 TS PO Q 4 TO 6 H    . pantoprazole (PROTONIX) 40 MG tablet Take 40 mg by mouth daily.  0  . sennosides-docusate sodium (SENOKOT-S) 8.6-50 MG tablet Take 2 tablets by mouth daily. (Patient taking differently: Take 2 tablets by mouth as needed. ) 30 tablet 1  . triamcinolone (KENALOG) 0.025 % cream APP EXT AA BID    . valsartan (DIOVAN) 320 MG tablet Take 0.5 tablets (160 mg total) by mouth daily. 30 tablet 1   No  current facility-administered medications for this visit.     Allergies:   Amlodipine, Levofloxacin, and Penicillins   Social History:  The patient  reports that she has never smoked. She has never used smokeless tobacco. She reports that she does not drink alcohol or use drugs.   Family History:  The patient's family history includes Alcoholism in her father; Diabetes in her brother, brother, brother, and mother; Throat cancer in her brother; Uterine cancer in her mother.    ROS:  Please see the history of present illness.   Otherwise, review of systems is positive for none.   All other systems are reviewed and negative.    PHYSICAL EXAM: VS:  BP (!) 162/74   Pulse 82   Ht 5' (1.524 m)   Wt 129 lb (58.5 kg)   SpO2 98%   BMI 25.19 kg/m  , BMI Body mass index is 25.19 kg/m. GEN: Well nourished, well developed, in no acute distress  HEENT: normal  Neck: no JVD, carotid bruits, or masses Cardiac: RRR; no murmurs, rubs, or gallops,no edema  Respiratory:  clear to auscultation bilaterally, normal work of breathing GI: soft, nontender, nondistended, + BS MS: no deformity or atrophy  Skin: warm and dry, device pocket is well healed Neuro:  Strength and sensation are intact Psych: euthymic mood, full affect  EKG:  EKG is ordered today. Personal review of the ekg ordered shows atrial fibrillation, intermittent ventricular paced, rate 82  Device interrogation is reviewed today in detail.  See PaceArt for details.   Recent Labs: 08/21/2018: BUN 24; Creatinine, Ser 1.09; Potassium 4.6; Sodium 143    Lipid Panel  No results found for: CHOL, TRIG, HDL, CHOLHDL, VLDL, LDLCALC, LDLDIRECT   Wt Readings from Last 3 Encounters:  01/27/19 129 lb (58.5 kg)  11/08/18 130 lb (59 kg)  10/18/18 135 lb (61.2 kg)      Other studies Reviewed: Additional studies/ records that were reviewed today include: TTE 10/30/2017 Review of the above records today demonstrates:  - Left ventricle: The  cavity size was normal. Systolic function was   mildly reduced. The estimated ejection fraction was in the range   of 45% to 50%. Wall motion was normal; there were no regional   wall motion abnormalities. Features are consistent with a   pseudonormal left ventricular filling pattern, with concomitant   abnormal relaxation and increased filling pressure (grade 2   diastolic dysfunction). - Aortic valve: There was trivial regurgitation. - Mitral valve: There was moderate regurgitation. - Left atrium: The atrium was moderately dilated. - Pulmonary  arteries: PA peak pressure: 71 mm Hg (S).   ASSESSMENT AND PLAN:  1.  Permanent atrial fibrillation: Has refused anticoagulation.  Currently on aspirin.  Is status post Medtronic dual-chamber pacemaker.  Device functioning appropriately.  No changes.  This patients CHA2DS2-VASc Score and unadjusted Ischemic Stroke Rate (% per year) is equal to 4.8 % stroke rate/year from a score of 4  Above score calculated as 1 point each if present [CHF, HTN, DM, Vascular=MI/PAD/Aortic Plaque, Age if 65-74, or Female] Above score calculated as 2 points each if present [Age > 75, or Stroke/TIA/TE]  2.  Dilated cardiomyopathy: no volume overload  3.  Hypertension: elevated today. Normal at home. No changes.  Current medicines are reviewed at length with the patient today.   The patient does not have concerns regarding her medicines.  The following changes were made today:  none  Labs/ tests ordered today include:  Orders Placed This Encounter  Procedures  . EKG 12-Lead     Disposition:   FU with Shigeko Manard 1 year  Signed, Shiro Ellerman Meredith Leeds, MD  01/27/2019 12:29 PM     [place] [address] [place] [address] [phone] (office) [phone] (fax)

## 2019-02-11 ENCOUNTER — Ambulatory Visit (INDEPENDENT_AMBULATORY_CARE_PROVIDER_SITE_OTHER): Payer: Medicare Other | Admitting: Cardiology

## 2019-02-11 ENCOUNTER — Other Ambulatory Visit: Payer: Self-pay

## 2019-02-11 VITALS — BP 120/62 | HR 54 | Ht 60.0 in | Wt 138.7 lb

## 2019-02-11 DIAGNOSIS — I4821 Permanent atrial fibrillation: Secondary | ICD-10-CM

## 2019-02-11 DIAGNOSIS — Z950 Presence of cardiac pacemaker: Secondary | ICD-10-CM

## 2019-02-11 DIAGNOSIS — E559 Vitamin D deficiency, unspecified: Secondary | ICD-10-CM | POA: Diagnosis not present

## 2019-02-11 DIAGNOSIS — I480 Paroxysmal atrial fibrillation: Secondary | ICD-10-CM | POA: Diagnosis not present

## 2019-02-11 DIAGNOSIS — E785 Hyperlipidemia, unspecified: Secondary | ICD-10-CM | POA: Diagnosis not present

## 2019-02-11 DIAGNOSIS — D638 Anemia in other chronic diseases classified elsewhere: Secondary | ICD-10-CM | POA: Diagnosis not present

## 2019-02-11 DIAGNOSIS — I420 Dilated cardiomyopathy: Secondary | ICD-10-CM | POA: Diagnosis not present

## 2019-02-11 DIAGNOSIS — I10 Essential (primary) hypertension: Secondary | ICD-10-CM | POA: Diagnosis not present

## 2019-02-11 HISTORY — DX: Permanent atrial fibrillation: I48.21

## 2019-02-11 NOTE — Patient Instructions (Signed)
Medication Instructions:  Your physician recommends that you continue on your current medications as directed. Please refer to the Current Medication list given to you today.  *If you need a refill on your cardiac medications before your next appointment, please call your pharmacy*  Lab Work: None.  If you have labs (blood work) drawn today and your tests are completely normal, you will receive your results only by: . MyChart Message (if you have MyChart) OR . A paper copy in the mail If you have any lab test that is abnormal or we need to change your treatment, we will call you to review the results.  Testing/Procedures: Your physician has requested that you have an echocardiogram. Echocardiography is a painless test that uses sound waves to create images of your heart. It provides your doctor with information about the size and shape of your heart and how well your heart's chambers and valves are working. This procedure takes approximately one hour. There are no restrictions for this procedure.    Follow-Up: At [facility], you and your health needs are our priority.  As part of our continuing mission to provide you with exceptional heart care, we have created designated Provider Care Teams.  These Care Teams include your primary Cardiologist (physician) and Advanced Practice Providers (APPs -  Physician Assistants and Nurse Practitioners) who all work together to provide you with the care you need, when you need it.  Your next appointment:   5 months  The format for your next appointment:   In Person  Provider:   Robert Krasowski, MD  Other Instructions   Echocardiogram An echocardiogram is a procedure that uses painless sound waves (ultrasound) to produce an image of the heart. Images from an echocardiogram can provide important information about:  Signs of coronary artery disease (CAD).  Aneurysm detection. An aneurysm is a weak or damaged part of an artery wall that  bulges out from the normal force of blood pumping through the body.  Heart size and shape. Changes in the size or shape of the heart can be associated with certain conditions, including heart failure, aneurysm, and CAD.  Heart muscle function.  Heart valve function.  Signs of a past heart attack.  Fluid buildup around the heart.  Thickening of the heart muscle.  A tumor or infectious growth around the heart valves. Tell a health care provider about:  Any allergies you have.  All medicines you are taking, including vitamins, herbs, eye drops, creams, and over-the-counter medicines.  Any blood disorders you have.  Any surgeries you have had.  Any medical conditions you have.  Whether you are pregnant or may be pregnant. What are the risks? Generally, this is a safe procedure. However, problems may occur, including:  Allergic reaction to dye (contrast) that may be used during the procedure. What happens before the procedure? No specific preparation is needed. You may eat and drink normally. What happens during the procedure?   An IV tube may be inserted into one of your veins.  You may receive contrast through this tube. A contrast is an injection that improves the quality of the pictures from your heart.  A gel will be applied to your chest.  A wand-like tool (transducer) will be moved over your chest. The gel will help to transmit the sound waves from the transducer.  The sound waves will harmlessly bounce off of your heart to allow the heart images to be captured in real-time motion. The images will be recorded   on a computer. The procedure may vary among health care providers and hospitals. What happens after the procedure?  You may return to your normal, everyday life, including diet, activities, and medicines, unless your health care provider tells you not to do that. Summary  An echocardiogram is a procedure that uses painless sound waves (ultrasound) to produce  an image of the heart.  Images from an echocardiogram can provide important information about the size and shape of your heart, heart muscle function, heart valve function, and fluid buildup around your heart.  You do not need to do anything to prepare before this procedure. You may eat and drink normally.  After the echocardiogram is completed, you may return to your normal, everyday life, unless your health care provider tells you not to do that. This information is not intended to replace advice given to you by your health care provider. Make sure you discuss any questions you have with your health care provider. Document Released: 03/03/2000 Document Revised: 06/27/2018 Document Reviewed: 04/08/2016 Elsevier Patient Education  2020 [employer]   

## 2019-02-11 NOTE — Progress Notes (Signed)
Cardiology Office Note:    Date:  02/11/2019   ID:  Kaitlyn, [name] 12-05-1931, MRN [id number]  PCP:  Nicoletta Dress, MD  Cardiologist:  Jenne Campus, MD    Referring MD: Nicoletta Dress, MD   Chief Complaint  Patient presents with  . Follow-up  Needs arm surgery  History of Present Illness:    [name] is a 85 y.o. female past medical history significant for permanent atrial fibrillation, cardiomyopathy, mitral regurgitation last estimation of left ventricle ejection fraction August 2018.  Also history of arthritis she is talking to me today about potential surgery for her right arm there is some question about potentially having pinching nerve on the neck as well as carpal tunnel syndrome.  She is debating about if she wants to have surgery she is worried about her heart.  Overall seems to be doing well the tragedy strike her second son died because of advanced COPD and obviously she is grieving after that.  Denies have any chest pain tightness squeezing pressure burning chest does not move much she walks with a walker she laughs planting and she cannot do it because of problem with her arm.  Past Medical History:  Diagnosis Date  . A-fib ([place])   . Anemia   . Aortic valve disorder   . Arthritis    osteoarthritis - back and knee  . Benign hypertensive heart disease without heart failure   . Blood transfusion   . Cataract    bilateral  . Chronic back pain   . Diverticulosis   . GERD (gastroesophageal reflux disease)   . History of stomach ulcers   . Hyperlipidemia   . Hypertension   . Osteoarthrosis   . Pacemaker   . Rotator cuff tear arthropathy of right shoulder 11/16/2015  . Status post lumbar surgery   . Vitamin D deficiency     Past Surgical History:  Procedure Laterality Date  . ABDOMINAL HYSTERECTOMY    . APPENDECTOMY    . BACK SURGERY     five back surgeries  . cataract extaction -left    . cataract extraction right    .  CHOLECYSTECTOMY    . COLONOSCOPY    . CRANIOTOMY     ? subdural hematoma?  Sustained head injury after falling while taking Coumadin  . ESOPHAGOGASTRODUODENOSCOPY    . EYE SURGERY Bilateral    cataract surgery with lens implant  . FRACTURE SURGERY     left wrist, with plate and removal of plate  . INSERT / REPLACE / REMOVE PACEMAKER     Medtronic  . KNEE ARTHROPLASTY     right knee  . REVERSE SHOULDER ARTHROPLASTY Right 11/16/2015  . REVERSE SHOULDER ARTHROPLASTY Right 11/16/2015   Procedure: REVERSE SHOULDER ARTHROPLASTY;  Surgeon: Marchia Bond, MD;  Location: [facility];  Service: Orthopedics;  Laterality: Right;  . S1 vertebroplasty    . SHOULDER OPEN ROTATOR CUFF REPAIR  right    Current Medications: Current Meds  Medication Sig  . alendronate (FOSAMAX) 70 MG tablet Take 70 mg by mouth once a week. Take with a full glass of water on an empty stomach.  Marland Kitchen aspirin EC 81 MG tablet Take 81 mg by mouth at bedtime.    . Calcium Carb-Cholecalciferol (CALCIUM-VITAMIN D) 500-200 MG-UNIT tablet Take 1 tablet 2 (two) times daily by mouth.  . Coenzyme Q10 100 MG capsule Take 100 mg daily by mouth.   . cyanocobalamin ([facility]S VITAMIN B12) 2000 MCG tablet Take  2,000 mcg by mouth daily.  Marland Kitchen diltiazem (CARDIZEM CD) 240 MG 24 hr capsule Take 1 capsule (240 mg total) by mouth daily.  . famotidine (PEPCID) 40 MG tablet Take 40 mg by mouth daily.  . furosemide (LASIX) 40 MG tablet Take 0.5 tablets (20 mg total) by mouth daily.  Marland Kitchen gabapentin (NEURONTIN) 100 MG capsule Take 100 mg by mouth daily.   Marland Kitchen ibuprofen (ADVIL) 800 MG tablet Take 800 mg by mouth every 8 (eight) hours as needed.  Marland Kitchen Kelp 100 MG TABS Take 100 mg by mouth daily.  . Multiple Vitamin (MULTIVITAMIN) tablet Take 1 tablet by mouth daily.  . Oxycodone HCl 10 MG TABS TK 1 TO 2 TS PO Q 4 TO 6 H  . pantoprazole (PROTONIX) 40 MG tablet Take 40 mg by mouth daily.  . sennosides-docusate sodium (SENOKOT-S) 8.6-50 MG tablet Take 2 tablets by mouth  daily. (Patient taking differently: Take 2 tablets by mouth as needed. )  . triamcinolone (KENALOG) 0.025 % cream APP EXT AA BID  . valsartan (DIOVAN) 320 MG tablet Take 0.5 tablets (160 mg total) by mouth daily.     Allergies:   Amlodipine, Levofloxacin, and Penicillins   Social History   Socioeconomic History  . Marital status: Widowed    Spouse name: Not on file  . Number of children: 4  . Years of education: 74  . Highest education level: Not on file  Occupational History  . Occupation: retired  Scientific laboratory technician  . Financial resource strain: Not on file  . Food insecurity    Worry: Not on file    Inability: Not on file  . Transportation needs    Medical: Not on file    Non-medical: Not on file  Tobacco Use  . Smoking status: Never Smoker  . Smokeless tobacco: Never Used  Substance and Sexual Activity  . Alcohol use: No  . Drug use: No  . Sexual activity: Not on file  Lifestyle  . Physical activity    Days per week: Not on file    Minutes per session: Not on file  . Stress: Not on file  Relationships  . Social Herbalist on phone: Not on file    Gets together: Not on file    Attends religious service: Not on file    Active member of club or organization: Not on file    Attends meetings of clubs or organizations: Not on file    Relationship status: Not on file  Other Topics Concern  . Not on file  Social History Narrative   Lives alone , One story   Right handed   She retired as a Regulatory affairs officer.      Family History: The patient's family history includes Alcoholism in her father; Diabetes in her brother, brother, brother, and mother; Throat cancer in her brother; Uterine cancer in her mother. ROS:   Please see the history of present illness.    All 14 point review of systems negative except as described per history of present illness  EKGs/Labs/Other Studies Reviewed:      Recent Labs: 08/21/2018: BUN 24; Creatinine, Ser 1.09; Potassium 4.6; Sodium  143  Recent Lipid Panel No results found for: CHOL, TRIG, HDL, CHOLHDL, VLDL, LDLCALC, LDLDIRECT  Physical Exam:    VS:  BP 120/62   Pulse (!) 54   Ht 5' (1.524 m)   Wt 138 lb 11.2 oz (62.9 kg)   SpO2 95%   BMI 27.09 kg/m  Wt Readings from Last 3 Encounters:  02/11/19 138 lb 11.2 oz (62.9 kg)  01/27/19 129 lb (58.5 kg)  11/08/18 130 lb (59 kg)     GEN:  Well nourished, well developed in no acute distress HEENT: Normal NECK: No JVD; No carotid bruits LYMPHATICS: No lymphadenopathy CARDIAC: RRR, no murmurs, no rubs, no gallops RESPIRATORY:  Clear to auscultation without rales, wheezing or rhonchi  ABDOMEN: Soft, non-tender, non-distended MUSCULOSKELETAL:  No edema; No deformity  SKIN: Warm and dry LOWER EXTREMITIES: no swelling NEUROLOGIC:  Alert and oriented x 3 PSYCHIATRIC:  Normal affect   ASSESSMENT:    1. Dilated cardiomyopathy ([place])   2. Permanent atrial fibrillation ([place])   3. Pacemaker    PLAN:    In order of problems listed above:  1. Dilated cardiomyopathy finally she agreed to have an echocardiogram she will be scheduled to have echocardiogram within the next few weeks.  Based on that we will decide what would be neck step I suspect stress test will need to be done to assess her is before surgery. 2. Permanent atrial fibrillation rate appears to be controlled, she refused anticoagulation she still refusing it. 3. Pacemaker present followed by our E EP team 4. Overall had a long discussion with her I described to her what we will do try to assess her heart in order for her to make a conscious decision about potential surgery.  We will also try to investigate what kind of surgery we talking about and what anesthesia will be used.  That will help [place] fully assess risk for surgery.   Medication Adjustments/Labs and Tests Ordered: Current medicines are reviewed at length with the patient today.  Concerns regarding medicines are outlined above.  No orders of  the defined types were placed in this encounter.  Medication changes: No orders of the defined types were placed in this encounter.   Signed, Park Liter, MD, [facility] 02/11/2019 1:36 PM    [place]

## 2019-02-11 NOTE — Addendum Note (Signed)
Addended by: Ashok Norris on: 02/11/2019 01:54 PM   Modules accepted: Orders

## 2019-02-21 ENCOUNTER — Other Ambulatory Visit: Payer: Self-pay | Admitting: Cardiology

## 2019-03-17 ENCOUNTER — Ambulatory Visit (INDEPENDENT_AMBULATORY_CARE_PROVIDER_SITE_OTHER): Payer: Medicare Other | Admitting: *Deleted

## 2019-03-17 DIAGNOSIS — Z950 Presence of cardiac pacemaker: Secondary | ICD-10-CM | POA: Diagnosis not present

## 2019-03-17 LAB — CUP PACEART REMOTE DEVICE CHECK
Battery Impedance: 2529 Ohm
Battery Remaining Longevity: 32 mo
Battery Voltage: 2.73 V
Brady Statistic RV Percent Paced: 18 %
Date Time Interrogation Session: 20201228130855
Implantable Lead Implant Date: 20120911
Implantable Lead Implant Date: 20120911
Implantable Lead Location: 753859
Implantable Lead Location: 753860
Implantable Lead Model: 4092
Implantable Lead Model: 5076
Implantable Pulse Generator Implant Date: 20120911
Lead Channel Impedance Value: 653 Ohm
Lead Channel Impedance Value: 67 Ohm
Lead Channel Pacing Threshold Amplitude: 0.625 V
Lead Channel Pacing Threshold Pulse Width: 0.4 ms
Lead Channel Setting Pacing Amplitude: 2 V
Lead Channel Setting Pacing Pulse Width: 0.4 ms
Lead Channel Setting Sensing Sensitivity: 5.6 mV

## 2019-03-18 NOTE — Progress Notes (Signed)
PPM remote 

## 2019-03-27 LAB — CUP PACEART INCLINIC DEVICE CHECK
Date Time Interrogation Session: 20201109133855
Implantable Lead Implant Date: 20120911
Implantable Lead Implant Date: 20120911
Implantable Lead Location: 753859
Implantable Lead Location: 753860
Implantable Lead Model: 4092
Implantable Lead Model: 5076
Implantable Pulse Generator Implant Date: 20120911

## 2019-04-10 ENCOUNTER — Other Ambulatory Visit: Payer: Self-pay

## 2019-04-10 ENCOUNTER — Ambulatory Visit (INDEPENDENT_AMBULATORY_CARE_PROVIDER_SITE_OTHER): Payer: Medicare Other

## 2019-04-10 DIAGNOSIS — I420 Dilated cardiomyopathy: Secondary | ICD-10-CM

## 2019-04-10 NOTE — Progress Notes (Signed)
  Echocardiogram 2D Echocardiogram has been performed.  [name] [facility] 1/21/20201 14:45

## 2019-04-16 ENCOUNTER — Telehealth: Payer: Self-pay | Admitting: Emergency Medicine

## 2019-04-16 DIAGNOSIS — G5601 Carpal tunnel syndrome, right upper limb: Secondary | ICD-10-CM | POA: Diagnosis not present

## 2019-04-16 NOTE — Telephone Encounter (Signed)
Left echocardiogram results on patient's voicemail per dpr. Advised patient to call with any questions.

## 2019-04-28 DIAGNOSIS — G5601 Carpal tunnel syndrome, right upper limb: Secondary | ICD-10-CM | POA: Diagnosis not present

## 2019-04-28 DIAGNOSIS — M5412 Radiculopathy, cervical region: Secondary | ICD-10-CM | POA: Diagnosis not present

## 2019-05-05 DIAGNOSIS — G5601 Carpal tunnel syndrome, right upper limb: Secondary | ICD-10-CM | POA: Diagnosis not present

## 2019-05-05 DIAGNOSIS — M5412 Radiculopathy, cervical region: Secondary | ICD-10-CM | POA: Diagnosis not present

## 2019-05-14 DIAGNOSIS — I480 Paroxysmal atrial fibrillation: Secondary | ICD-10-CM | POA: Diagnosis not present

## 2019-05-14 DIAGNOSIS — D638 Anemia in other chronic diseases classified elsewhere: Secondary | ICD-10-CM | POA: Diagnosis not present

## 2019-05-14 DIAGNOSIS — E559 Vitamin D deficiency, unspecified: Secondary | ICD-10-CM | POA: Diagnosis not present

## 2019-05-14 DIAGNOSIS — E785 Hyperlipidemia, unspecified: Secondary | ICD-10-CM | POA: Diagnosis not present

## 2019-05-14 DIAGNOSIS — I10 Essential (primary) hypertension: Secondary | ICD-10-CM | POA: Diagnosis not present

## 2019-05-20 ENCOUNTER — Other Ambulatory Visit: Payer: Self-pay | Admitting: Cardiology

## 2019-05-20 MED ORDER — FUROSEMIDE 40 MG PO TABS: 20.0000 mg | ORAL_TABLET | Freq: Every day | ORAL | 0 refills | Status: DC

## 2019-05-20 NOTE — Telephone Encounter (Signed)
Prescription sent

## 2019-05-20 NOTE — Telephone Encounter (Signed)
*  STAT* If patient is at the pharmacy, call can be transferred to refill team.   1. Which medications need to be refilled? (please list name of each medication and dose if known)  Need a new prescription for  Furosemide  2. Which pharmacy/location (including street and city if local pharmacy) is medication to be sent to? [facility]  [place]  3. Do they need a 30 day or 90 day supply? 90 days and refills

## 2019-05-26 DIAGNOSIS — Z23 Encounter for immunization: Secondary | ICD-10-CM | POA: Diagnosis not present

## 2019-06-16 ENCOUNTER — Ambulatory Visit (INDEPENDENT_AMBULATORY_CARE_PROVIDER_SITE_OTHER): Payer: Medicare Other | Admitting: *Deleted

## 2019-06-16 DIAGNOSIS — Z950 Presence of cardiac pacemaker: Secondary | ICD-10-CM

## 2019-06-16 LAB — CUP PACEART REMOTE DEVICE CHECK
Battery Impedance: 2656 Ohm
Battery Remaining Longevity: 31 mo
Battery Voltage: 2.74 V
Brady Statistic RV Percent Paced: 16 %
Date Time Interrogation Session: 20210329095832
Implantable Lead Implant Date: 20120911
Implantable Lead Implant Date: 20120911
Implantable Lead Location: 753859
Implantable Lead Location: 753860
Implantable Lead Model: 4092
Implantable Lead Model: 5076
Implantable Pulse Generator Implant Date: 20120911
Lead Channel Impedance Value: 649 Ohm
Lead Channel Impedance Value: 67 Ohm
Lead Channel Pacing Threshold Amplitude: 0.625 V
Lead Channel Pacing Threshold Pulse Width: 0.4 ms
Lead Channel Setting Pacing Amplitude: 2 V
Lead Channel Setting Pacing Pulse Width: 0.4 ms
Lead Channel Setting Sensing Sensitivity: 5.6 mV

## 2019-06-16 NOTE — Progress Notes (Signed)
PPM Remote  

## 2019-06-23 DIAGNOSIS — Z23 Encounter for immunization: Secondary | ICD-10-CM | POA: Diagnosis not present

## 2019-06-26 ENCOUNTER — Telehealth: Payer: Self-pay | Admitting: Cardiology

## 2019-06-26 ENCOUNTER — Telehealth: Payer: Self-pay | Admitting: *Deleted

## 2019-06-26 MED ORDER — DILTIAZEM HCL ER COATED BEADS 300 MG PO CP24: 300.0000 mg | ORAL_CAPSULE | Freq: Every day | ORAL | 6 refills | Status: DC

## 2019-06-26 NOTE — Telephone Encounter (Signed)
Clarified w/ pt HRs per our conversation earlier this week when pt wanted clarification of how high the rates were going. She is agreeable to med increase. Advised to call office with any SE after increasing. She is agreeable.

## 2019-06-26 NOTE — Telephone Encounter (Signed)
[name] is returning [employer]. Please advise.

## 2019-06-26 NOTE — Telephone Encounter (Signed)
-----   Message from Will Meredith Leeds, MD sent at 06/18/2019  3:45 PM EDT ----- Abnormal device interrogation reviewed.  Lead parameters and battery status stable.  Tachycardic. Increase diltiazm to 300 mg.

## 2019-06-26 NOTE — Telephone Encounter (Signed)
Spoke to pt earlier this week about result.   Got further clarification of HRs from device clinic, per pt request. [email or web address] to discuss findings/recommendation

## 2019-07-03 DIAGNOSIS — M542 Cervicalgia: Secondary | ICD-10-CM | POA: Diagnosis not present

## 2019-07-03 DIAGNOSIS — G5600 Carpal tunnel syndrome, unspecified upper limb: Secondary | ICD-10-CM | POA: Diagnosis not present

## 2019-07-22 ENCOUNTER — Ambulatory Visit (INDEPENDENT_AMBULATORY_CARE_PROVIDER_SITE_OTHER): Payer: Medicare Other | Admitting: Cardiology

## 2019-07-22 ENCOUNTER — Other Ambulatory Visit: Payer: Self-pay

## 2019-07-22 ENCOUNTER — Encounter: Payer: Self-pay | Admitting: Cardiology

## 2019-07-22 VITALS — BP 164/88 | HR 88 | Ht 60.0 in | Wt 145.0 lb

## 2019-07-22 DIAGNOSIS — I482 Chronic atrial fibrillation, unspecified: Secondary | ICD-10-CM

## 2019-07-22 DIAGNOSIS — I10 Essential (primary) hypertension: Secondary | ICD-10-CM | POA: Diagnosis not present

## 2019-07-22 DIAGNOSIS — Z950 Presence of cardiac pacemaker: Secondary | ICD-10-CM | POA: Diagnosis not present

## 2019-07-22 MED ORDER — DILTIAZEM HCL ER COATED BEADS 240 MG PO CP24: 240.0000 mg | ORAL_CAPSULE | Freq: Every day | ORAL | 2 refills | Status: DC

## 2019-07-22 NOTE — Progress Notes (Signed)
Cardiology Office Note:    Date:  07/22/2019   ID:  [name] 06/14/1931, MRN [id number]  PCP:  Nicoletta Dress, MD  Cardiologist:  Jenne Campus, MD    Referring MD: Nicoletta Dress, MD   No chief complaint on file. Not doing well and tired and exhausted and sleepy  History of Present Illness:    [name] is a 86 y.o. female past medical history significant for permanent atrial fibrillation, cardiomyopathy, mitral regurgitation last estimation of left ventricle ejection fraction August 2018.  Also history of arthritis she is talking to me today about potential surgery for her right arm there is some question about potentially having pinching nerve on the neck as well as carpal tunnel syndrome.  She is debating about if she wants to have surgery she is worried about her heart.   She complained of being weak tired exhausted.  She said that happen after the dose of Cardizem has been increased.  Denies have any chest pain tightness squeezing pressure burning chest fatigue tiredness and shortness of breath is still there.  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  Past Medical History:  Diagnosis Date  . A-fib ([place])   . Anemia   . Aortic valve disorder   . Arthritis    osteoarthritis - back and knee  . Benign hypertensive heart disease without heart failure   . Blood transfusion   . Cataract    bilateral  . Chronic back pain   . Diverticulosis   . GERD (gastroesophageal reflux disease)   . History of stomach ulcers   . Hyperlipidemia   . Hypertension   . Osteoarthrosis   . Pacemaker   . Rotator cuff tear arthropathy of right shoulder 11/16/2015  . Status post lumbar surgery   . Vitamin D deficiency     Past Surgical History:  Procedure Laterality Date  . ABDOMINAL HYSTERECTOMY    . APPENDECTOMY    . BACK SURGERY     five back surgeries  . cataract extaction -left    . cataract extraction right    . CHOLECYSTECTOMY    . COLONOSCOPY     . CRANIOTOMY     ? subdural hematoma?  Sustained head injury after falling while taking Coumadin  . ESOPHAGOGASTRODUODENOSCOPY    . EYE SURGERY Bilateral    cataract surgery with lens implant  . FRACTURE SURGERY     left wrist, with plate and removal of plate  . INSERT / REPLACE / REMOVE PACEMAKER     Medtronic  . KNEE ARTHROPLASTY     right knee  . REVERSE SHOULDER ARTHROPLASTY Right 11/16/2015  . REVERSE SHOULDER ARTHROPLASTY Right 11/16/2015   Procedure: REVERSE SHOULDER ARTHROPLASTY;  Surgeon: Marchia Bond, MD;  Location: [facility];  Service: Orthopedics;  Laterality: Right;  . S1 vertebroplasty    . SHOULDER OPEN ROTATOR CUFF REPAIR  right    Current Medications: Current Meds  Medication Sig  . alendronate (FOSAMAX) 70 MG tablet Take 70 mg by mouth once a week. Take with a full glass of water on an empty stomach.  Marland Kitchen aspirin EC 81 MG tablet Take 81 mg by mouth at bedtime.    . Calcium Carb-Cholecalciferol (CALCIUM-VITAMIN D) 500-200 MG-UNIT tablet Take 1 tablet 2 (two) times daily by mouth.  . Coenzyme Q10 100 MG capsule Take 100 mg daily by mouth.   . cyanocobalamin ([facility]S VITAMIN B12) 2000 MCG tablet Take 2,000 mcg by mouth  daily.  . diltiazem (CARDIZEM CD) 240 MG 24 hr capsule Take 1 capsule (240 mg total) by mouth daily.  . famotidine (PEPCID) 40 MG tablet Take 40 mg by mouth daily.  . furosemide (LASIX) 40 MG tablet Take 0.5 tablets (20 mg total) by mouth daily.  Marland Kitchen gabapentin (NEURONTIN) 100 MG capsule Take 100 mg by mouth daily.   Marland Kitchen ibuprofen (ADVIL) 800 MG tablet Take 800 mg by mouth every 8 (eight) hours as needed.  Marland Kitchen Kelp 100 MG TABS Take 100 mg by mouth daily.  . Multiple Vitamin (MULTIVITAMIN) tablet Take 1 tablet by mouth daily.  . Oxycodone HCl 10 MG TABS TK 1 TO 2 TS PO Q 4 TO 6 H  . pantoprazole (PROTONIX) 40 MG tablet Take 40 mg by mouth daily.  . sennosides-docusate sodium (SENOKOT-S) 8.6-50 MG tablet Take 2 tablets by mouth daily. (Patient taking differently:  Take 2 tablets by mouth as needed. )  . triamcinolone (KENALOG) 0.025 % cream APP EXT AA BID  . valsartan (DIOVAN) 320 MG tablet Take 0.5 tablets (160 mg total) by mouth daily.  . [DISCONTINUED] diltiazem (CARDIZEM CD) 300 MG 24 hr capsule Take 1 capsule (300 mg total) by mouth daily.     Allergies:   Amlodipine, Levofloxacin, and Penicillins   Social History   Socioeconomic History  . Marital status: Widowed    Spouse name: Not on file  . Number of children: 4  . Years of education: 92  . Highest education level: Not on file  Occupational History  . Occupation: retired  Tobacco Use  . Smoking status: Never Smoker  . Smokeless tobacco: Never Used  Substance and Sexual Activity  . Alcohol use: No  . Drug use: No  . Sexual activity: Not on file  Other Topics Concern  . Not on file  Social History Narrative   Lives alone , One story   Right handed   She retired as a Regulatory affairs officer.    Social Determinants of Health   Financial Resource Strain:   . Difficulty of Paying Living Expenses:   Food Insecurity:   . Worried About Charity fundraiser in the Last Year:   . Arboriculturist in the Last Year:   Transportation Needs:   . Film/video editor (Medical):   Marland Kitchen Lack of Transportation (Non-Medical):   Physical Activity:   . Days of Exercise per Week:   . Minutes of Exercise per Session:   Stress:   . Feeling of Stress :   Social Connections:   . Frequency of Communication with Friends and Family:   . Frequency of Social Gatherings with Friends and Family:   . Attends Religious Services:   . Active Member of Clubs or Organizations:   . Attends Archivist Meetings:   Marland Kitchen Marital Status:      Family History: The patient's family history includes Alcoholism in her father; Diabetes in her brother, brother, brother, and mother; Throat cancer in her brother; Uterine cancer in her mother. ROS:   Please see the history of present illness.    All 14 point review of  systems negative except as described per history of present illness  EKGs/Labs/Other Studies Reviewed:      Recent Labs: 08/21/2018: BUN 24; Creatinine, Ser 1.09; Potassium 4.6; Sodium 143  Recent Lipid Panel No results found for: CHOL, TRIG, HDL, CHOLHDL, VLDL, LDLCALC, LDLDIRECT  Physical Exam:    VS:  BP (!) 164/88   Pulse 88  Ht 5' (1.524 m)   Wt 145 lb (65.8 kg)   SpO2 98%   BMI 28.32 kg/m     Wt Readings from Last 3 Encounters:  07/22/19 145 lb (65.8 kg)  02/11/19 138 lb 11.2 oz (62.9 kg)  01/27/19 129 lb (58.5 kg)     GEN:  Well nourished, well developed in no acute distress HEENT: Normal NECK: No JVD; No carotid bruits LYMPHATICS: No lymphadenopathy CARDIAC: RRR, no murmurs, no rubs, no gallops RESPIRATORY:  Clear to auscultation without rales, wheezing or rhonchi  ABDOMEN: Soft, non-tender, non-distended MUSCULOSKELETAL:  No edema; No deformity  SKIN: Warm and dry LOWER EXTREMITIES: no swelling NEUROLOGIC:  Alert and oriented x 3 PSYCHIATRIC:  Normal affect   ASSESSMENT:    1. Chronic atrial fibrillation ([place])   2. Essential hypertension   3. Pacemaker    PLAN:    In order of problems listed above:  1. Chronic atrial fibrillation.  I will cut down her Cardizem to previous notes she said since increase of Cardizem she is feeling much worse.  I would like to see her back prednisone within a month to see if this maneuver will make her feel better.  If not we will investigate this further. 2. Essential hypertension blood pressure appears to be well controlled continue present management. 3. Pacemaker: I  did review interrogation, battery status is good however she does have fast ventricular rate of atrial fibrillation.  Medication Adjustments/Labs and Tests Ordered: Current medicines are reviewed at length with the patient today.  Concerns regarding medicines are outlined above.  No orders of the defined types were placed in this encounter.  Medication  changes:  Meds ordered this encounter  Medications  . diltiazem (CARDIZEM CD) 240 MG 24 hr capsule    Sig: Take 1 capsule (240 mg total) by mouth daily.    Dispense:  30 capsule    Refill:  2    Dose increase    Signed, Kaitlyn Liter, MD, [facility] 07/22/2019 11:44 AM    [place]

## 2019-07-22 NOTE — Patient Instructions (Signed)
Medication Instructions:  Your physician has recommended you make the following change in your medication:   DECREASE: Cardizem to 240 mg daily  *If you need a refill on your cardiac medications before your next appointment, please call your pharmacy*   Lab Work: None.  If you have labs (blood work) drawn today and your tests are completely normal, you will receive your results only by: Marland Kitchen MyChart Message (if you have MyChart) OR . A paper copy in the mail If you have any lab test that is abnormal or we need to change your treatment, we will call you to review the results.   Testing/Procedures: None.    Follow-Up: At [facility], you and your health needs are our priority.  As part of our continuing mission to provide you with exceptional heart care, we have created designated Provider Care Teams.  These Care Teams include your primary Cardiologist (physician) and Advanced Practice Providers (APPs -  Physician Assistants and Nurse Practitioners) who all work together to provide you with the care you need, when you need it.  We recommend signing up for the patient portal called "MyChart".  Sign up information is provided on this After Visit Summary.  MyChart is used to connect with patients for Virtual Visits (Telemedicine).  Patients are able to view lab/test results, encounter notes, upcoming appointments, etc.  Non-urgent messages can be sent to your provider as well.   To learn more about what you can do with MyChart, go to [email or web address].    Your next appointment:   1 month(s)  The format for your next appointment:   In Person  Provider:   Jenne Campus, MD   Other Instructions

## 2019-08-08 DIAGNOSIS — M1711 Unilateral primary osteoarthritis, right knee: Secondary | ICD-10-CM | POA: Diagnosis not present

## 2019-08-08 DIAGNOSIS — M545 Low back pain: Secondary | ICD-10-CM | POA: Diagnosis not present

## 2019-08-08 DIAGNOSIS — M25552 Pain in left hip: Secondary | ICD-10-CM | POA: Diagnosis not present

## 2019-08-12 ENCOUNTER — Other Ambulatory Visit: Payer: Self-pay | Admitting: Cardiology

## 2019-08-13 DIAGNOSIS — D638 Anemia in other chronic diseases classified elsewhere: Secondary | ICD-10-CM | POA: Diagnosis not present

## 2019-08-13 DIAGNOSIS — I10 Essential (primary) hypertension: Secondary | ICD-10-CM | POA: Diagnosis not present

## 2019-08-13 DIAGNOSIS — I480 Paroxysmal atrial fibrillation: Secondary | ICD-10-CM | POA: Diagnosis not present

## 2019-08-13 DIAGNOSIS — E559 Vitamin D deficiency, unspecified: Secondary | ICD-10-CM | POA: Diagnosis not present

## 2019-08-13 DIAGNOSIS — E785 Hyperlipidemia, unspecified: Secondary | ICD-10-CM | POA: Diagnosis not present

## 2019-08-19 DIAGNOSIS — E785 Hyperlipidemia, unspecified: Secondary | ICD-10-CM | POA: Diagnosis not present

## 2019-08-19 DIAGNOSIS — Z1331 Encounter for screening for depression: Secondary | ICD-10-CM | POA: Diagnosis not present

## 2019-08-19 DIAGNOSIS — Z9181 History of falling: Secondary | ICD-10-CM | POA: Diagnosis not present

## 2019-08-19 DIAGNOSIS — Z0000 Encounter for general adult medical examination without abnormal findings: Secondary | ICD-10-CM | POA: Diagnosis not present

## 2019-08-22 DIAGNOSIS — N959 Unspecified menopausal and perimenopausal disorder: Secondary | ICD-10-CM | POA: Diagnosis not present

## 2019-08-22 DIAGNOSIS — M810 Age-related osteoporosis without current pathological fracture: Secondary | ICD-10-CM | POA: Diagnosis not present

## 2019-08-26 DIAGNOSIS — M810 Age-related osteoporosis without current pathological fracture: Secondary | ICD-10-CM | POA: Diagnosis not present

## 2019-09-02 ENCOUNTER — Ambulatory Visit (INDEPENDENT_AMBULATORY_CARE_PROVIDER_SITE_OTHER): Payer: Medicare Other | Admitting: Cardiology

## 2019-09-02 ENCOUNTER — Other Ambulatory Visit: Payer: Self-pay

## 2019-09-02 ENCOUNTER — Encounter: Payer: Self-pay | Admitting: Cardiology

## 2019-09-02 VITALS — BP 152/88 | HR 83 | Ht 60.0 in | Wt 141.0 lb

## 2019-09-02 DIAGNOSIS — I420 Dilated cardiomyopathy: Secondary | ICD-10-CM

## 2019-09-02 DIAGNOSIS — I4821 Permanent atrial fibrillation: Secondary | ICD-10-CM

## 2019-09-02 DIAGNOSIS — I10 Essential (primary) hypertension: Secondary | ICD-10-CM

## 2019-09-02 DIAGNOSIS — Z950 Presence of cardiac pacemaker: Secondary | ICD-10-CM

## 2019-09-02 NOTE — Progress Notes (Signed)
Cardiology Office Note:    Date:  09/02/2019   ID:  [name], DOB 08-31-1931, MRN [id number]  PCP:  Nicoletta Dress, MD  Cardiologist:  Jenne Campus, MD    Referring MD: Nicoletta Dress, MD   Chief Complaint  Patient presents with  . Follow-up    1 MO FU   I am doing better  History of Present Illness:    [name] is a 86 y.o. female past medical history significant for dilated cardiomyopathy with normalization, permanent atrial fibrillation, pacemaker present.  Recently she has been complaining of weakness and fatigue.  She noticed this after dose of Cardizem has been increased, therefore, I decreased dose of Cardizem she comes today 2 months for follow-up.  Overall she is doing much better.  She has more energy.  One of the biggest problem she has is the 5 that she does have problem with the left knee.  She is talking about potentially having surgery and she wants to know my opinion regarding that issue I told her if she decided to have a surgery done we need to evaluate her heart with echocardiogram as well as probably with stress testing.  She said she will let me know when she make up her mind.  Denies have any palpitations no dizziness  Past Medical History:  Diagnosis Date  . A-fib ([place])   . Anemia   . Aortic valve disorder   . Arthritis    osteoarthritis - back and knee  . Benign hypertensive heart disease without heart failure   . Blood transfusion   . Cataract    bilateral  . Chronic back pain   . Diverticulosis   . GERD (gastroesophageal reflux disease)   . History of stomach ulcers   . Hyperlipidemia   . Hypertension   . Osteoarthrosis   . Pacemaker   . Rotator cuff tear arthropathy of right shoulder 11/16/2015  . Status post lumbar surgery   . Vitamin D deficiency     Past Surgical History:  Procedure Laterality Date  . ABDOMINAL HYSTERECTOMY    . APPENDECTOMY    . BACK SURGERY     five back surgeries  . cataract extaction -left     . cataract extraction right    . CHOLECYSTECTOMY    . COLONOSCOPY    . CRANIOTOMY     ? subdural hematoma?  Sustained head injury after falling while taking Coumadin  . ESOPHAGOGASTRODUODENOSCOPY    . EYE SURGERY Bilateral    cataract surgery with lens implant  . FRACTURE SURGERY     left wrist, with plate and removal of plate  . INSERT / REPLACE / REMOVE PACEMAKER     [employer]  . KNEE ARTHROPLASTY     right knee  . REVERSE SHOULDER ARTHROPLASTY Right 11/16/2015  . REVERSE SHOULDER ARTHROPLASTY Right 11/16/2015   Procedure: REVERSE SHOULDER ARTHROPLASTY;  Surgeon: Marchia Bond, MD;  Location: [place];  Service: Orthopedics;  Laterality: Right;  . S1 vertebroplasty    . SHOULDER OPEN ROTATOR CUFF REPAIR  right    Current Medications: Current Meds  Medication Sig  . alendronate (FOSAMAX) 70 MG tablet Take 70 mg by mouth once a week. Take with a full glass of water on an empty stomach.  Marland Kitchen aspirin EC 81 MG tablet Take 81 mg by mouth at bedtime.    . Calcium Carb-Cholecalciferol (CALCIUM-VITAMIN D) 500-200 MG-UNIT tablet Take 1 tablet 2 (two) times daily by mouth.  . Coenzyme  Q10 100 MG capsule Take 100 mg daily by mouth.   . cyanocobalamin ([facility] VITAMIN B12) 2000 MCG tablet Take 2,000 mcg by mouth daily.  Marland Kitchen diltiazem (CARDIZEM CD) 240 MG 24 hr capsule Take 1 capsule (240 mg total) by mouth daily.  . famotidine (PEPCID) 40 MG tablet Take 40 mg by mouth daily.  . furosemide (LASIX) 40 MG tablet TAKE 1/2 TABLET BY MOUTH DAILY  . gabapentin (NEURONTIN) 100 MG capsule Take 100 mg by mouth daily.   Marland Kitchen ibuprofen (ADVIL) 800 MG tablet Take 800 mg by mouth every 8 (eight) hours as needed.  Marland Kitchen Kelp 100 MG TABS Take 100 mg by mouth daily.  . Multiple Vitamin (MULTIVITAMIN) tablet Take 1 tablet by mouth daily.  . Oxycodone HCl 10 MG TABS TK 1 TO 2 TS PO Q 4 TO 6 H  . pantoprazole (PROTONIX) 40 MG tablet Take 40 mg by mouth daily.  . sennosides-docusate sodium (SENOKOT-S) 8.6-50 MG tablet Take 2  tablets by mouth daily. (Patient taking differently: Take 2 tablets by mouth as needed. )  . triamcinolone (KENALOG) 0.025 % cream APP EXT AA BID  . valsartan (DIOVAN) 320 MG tablet Take 0.5 tablets (160 mg total) by mouth daily.     Allergies:   Amlodipine, Levofloxacin, and Penicillins   Social History   Socioeconomic History  . Marital status: Widowed    Spouse name: Not on file  . Number of children: 4  . Years of education: 31  . Highest education level: Not on file  Occupational History  . Occupation: retired  Tobacco Use  . Smoking status: Never Smoker  . Smokeless tobacco: Never Used  Vaping Use  . Vaping Use: Never used  Substance and Sexual Activity  . Alcohol use: No  . Drug use: No  . Sexual activity: Not on file  Other Topics Concern  . Not on file  Social History Narrative   Lives alone , One story   Right handed   She retired as a Regulatory affairs officer.    Social Determinants of Health   Financial Resource Strain:   . Difficulty of Paying Living Expenses:   Food Insecurity:   . Worried About Charity fundraiser in the Last Year:   . Arboriculturist in the Last Year:   Transportation Needs:   . Film/video editor (Medical):   Marland Kitchen Lack of Transportation (Non-Medical):   Physical Activity:   . Days of Exercise per Week:   . Minutes of Exercise per Session:   Stress:   . Feeling of Stress :   Social Connections:   . Frequency of Communication with Friends and Family:   . Frequency of Social Gatherings with Friends and Family:   . Attends Religious Services:   . Active Member of Clubs or Organizations:   . Attends Archivist Meetings:   Marland Kitchen Marital Status:      Family History: The patient's family history includes Alcoholism in her father; Diabetes in her brother, brother, brother, and mother; Throat cancer in her brother; Uterine cancer in her mother. ROS:   Please see the history of present illness.    All 14 point review of systems negative  except as described per history of present illness  EKGs/Labs/Other Studies Reviewed:      Recent Labs: No results found for requested labs within last 8760 hours.  Recent Lipid Panel No results found for: CHOL, TRIG, HDL, CHOLHDL, VLDL, LDLCALC, LDLDIRECT  Physical Exam:  VS:  BP (!) 152/88 (BP Location: Right Arm, Patient Position: Sitting, Cuff Size: Normal)   Pulse 83   Ht 5' (1.524 m)   Wt 141 lb (64 kg)   SpO2 93%   BMI 27.54 kg/m     Wt Readings from Last 3 Encounters:  09/02/19 141 lb (64 kg)  07/22/19 145 lb (65.8 kg)  02/11/19 138 lb 11.2 oz (62.9 kg)     GEN:  Well nourished, well developed in no acute distress HEENT: Normal NECK: No JVD; No carotid bruits LYMPHATICS: No lymphadenopathy CARDIAC: Irregularly irregular, no murmurs, no rubs, no gallops RESPIRATORY:  Clear to auscultation without rales, wheezing or rhonchi  ABDOMEN: Soft, non-tender, non-distended MUSCULOSKELETAL:  No edema; No deformity  SKIN: Warm and dry LOWER EXTREMITIES: no swelling NEUROLOGIC:  Alert and oriented x 3 PSYCHIATRIC:  Normal affect   ASSESSMENT:    1. Permanent atrial fibrillation ([place])   2. Essential hypertension   3. Pacemaker   4. Dilated cardiomyopathy ([place])    PLAN:    In order of problems listed above:  1. Permanent atrial fibrillation rate controlled anticoagulated which I will continue.  She feels significantly better after decreasing dose of Cardizem. 2. Essential hypertension blood pressure slightly elevated today but she states always good at home. 3. Pacemaker present wrist interrogation reviewed normal function continue present management. 4. History of dilated cardiomyopathy with normalization based on last echocardiogram.  We will continue monitoring. 5. Potential knee surgery.  I told her if she decide to pursue that issue she needs to let us know then will look at her heart and assess her risk before the surgery.   Medication Adjustments/Labs and  Tests Ordered: Current medicines are reviewed at length with the patient today.  Concerns regarding medicines are outlined above.  No orders of the defined types were placed in this encounter.  Medication changes: No orders of the defined types were placed in this encounter.   Signed, Park Liter, MD, [facility] 09/02/2019 12:06 PM    [place]

## 2019-09-02 NOTE — Patient Instructions (Signed)

## 2019-09-12 ENCOUNTER — Other Ambulatory Visit: Payer: Self-pay | Admitting: Cardiology

## 2019-09-12 ENCOUNTER — Telehealth: Payer: Self-pay | Admitting: Emergency Medicine

## 2019-09-12 MED ORDER — VALSARTAN 320 MG PO TABS: 160.0000 mg | ORAL_TABLET | Freq: Every day | ORAL | 1 refills | Status: DC

## 2019-09-12 MED ORDER — DILTIAZEM HCL ER COATED BEADS 240 MG PO CP24: 240.0000 mg | ORAL_CAPSULE | Freq: Every day | ORAL | 1 refills | Status: DC

## 2019-09-12 NOTE — Telephone Encounter (Signed)
Patient called back and reported that she needed a refill on her diltiazem. I have filled this for her.

## 2019-09-12 NOTE — Telephone Encounter (Signed)
Medication refilled

## 2019-09-12 NOTE — Telephone Encounter (Signed)
*  STAT* If patient is at the pharmacy, call can be transferred to refill team.   1. Which medications need to be refilled? (please list name of each medication and dose if known)  Valsartan  2. Which pharmacy/location (including street and city if local pharmacy) is medication to be sent to? [facility] [address] [facility] [place]  3. Do they need a 30 day or 90 day supply? 90 days and refills

## 2019-09-15 ENCOUNTER — Ambulatory Visit (INDEPENDENT_AMBULATORY_CARE_PROVIDER_SITE_OTHER): Payer: Medicare Other | Admitting: *Deleted

## 2019-09-15 DIAGNOSIS — I4821 Permanent atrial fibrillation: Secondary | ICD-10-CM

## 2019-09-15 LAB — CUP PACEART REMOTE DEVICE CHECK
Battery Impedance: 2749 Ohm
Battery Remaining Longevity: 30 mo
Battery Voltage: 2.73 V
Brady Statistic RV Percent Paced: 13 %
Date Time Interrogation Session: 20210628085516
Implantable Lead Implant Date: 20120911
Implantable Lead Implant Date: 20120911
Implantable Lead Location: 753859
Implantable Lead Location: 753860
Implantable Lead Model: 4092
Implantable Lead Model: 5076
Implantable Pulse Generator Implant Date: 20120911
Lead Channel Impedance Value: 616 Ohm
Lead Channel Impedance Value: 67 Ohm
Lead Channel Pacing Threshold Amplitude: 0.625 V
Lead Channel Pacing Threshold Pulse Width: 0.4 ms
Lead Channel Setting Pacing Amplitude: 2 V
Lead Channel Setting Pacing Pulse Width: 0.4 ms
Lead Channel Setting Sensing Sensitivity: 5.6 mV

## 2019-09-19 DIAGNOSIS — M1711 Unilateral primary osteoarthritis, right knee: Secondary | ICD-10-CM | POA: Diagnosis not present

## 2019-09-19 NOTE — Progress Notes (Signed)
Remote pacemaker transmission.   

## 2019-10-07 DIAGNOSIS — M8580 Other specified disorders of bone density and structure, unspecified site: Secondary | ICD-10-CM | POA: Diagnosis not present

## 2019-11-13 DIAGNOSIS — D638 Anemia in other chronic diseases classified elsewhere: Secondary | ICD-10-CM | POA: Diagnosis not present

## 2019-11-13 DIAGNOSIS — E559 Vitamin D deficiency, unspecified: Secondary | ICD-10-CM | POA: Diagnosis not present

## 2019-11-13 DIAGNOSIS — E785 Hyperlipidemia, unspecified: Secondary | ICD-10-CM | POA: Diagnosis not present

## 2019-11-13 DIAGNOSIS — I480 Paroxysmal atrial fibrillation: Secondary | ICD-10-CM | POA: Diagnosis not present

## 2019-11-13 DIAGNOSIS — Z139 Encounter for screening, unspecified: Secondary | ICD-10-CM | POA: Diagnosis not present

## 2019-11-13 DIAGNOSIS — M5136 Other intervertebral disc degeneration, lumbar region: Secondary | ICD-10-CM | POA: Diagnosis not present

## 2019-11-13 DIAGNOSIS — I10 Essential (primary) hypertension: Secondary | ICD-10-CM | POA: Diagnosis not present

## 2019-12-13 DIAGNOSIS — Z23 Encounter for immunization: Secondary | ICD-10-CM | POA: Diagnosis not present

## 2019-12-15 ENCOUNTER — Ambulatory Visit (INDEPENDENT_AMBULATORY_CARE_PROVIDER_SITE_OTHER): Payer: Medicare Other | Admitting: Emergency Medicine

## 2019-12-15 DIAGNOSIS — I4821 Permanent atrial fibrillation: Secondary | ICD-10-CM

## 2019-12-15 LAB — CUP PACEART REMOTE DEVICE CHECK
Battery Impedance: 2888 Ohm
Battery Remaining Longevity: 29 mo
Battery Voltage: 2.73 V
Brady Statistic RV Percent Paced: 12 %
Date Time Interrogation Session: 20210927094318
Implantable Lead Implant Date: 20120911
Implantable Lead Implant Date: 20120911
Implantable Lead Location: 753859
Implantable Lead Location: 753860
Implantable Lead Model: 4092
Implantable Lead Model: 5076
Implantable Pulse Generator Implant Date: 20120911
Lead Channel Impedance Value: 662 Ohm
Lead Channel Impedance Value: 67 Ohm
Lead Channel Pacing Threshold Amplitude: 0.625 V
Lead Channel Pacing Threshold Pulse Width: 0.4 ms
Lead Channel Setting Pacing Amplitude: 2 V
Lead Channel Setting Pacing Pulse Width: 0.4 ms
Lead Channel Setting Sensing Sensitivity: 5.6 mV

## 2019-12-18 NOTE — Progress Notes (Signed)
Remote pacemaker transmission.   

## 2019-12-22 DIAGNOSIS — M1712 Unilateral primary osteoarthritis, left knee: Secondary | ICD-10-CM | POA: Diagnosis not present

## 2019-12-27 DIAGNOSIS — D638 Anemia in other chronic diseases classified elsewhere: Secondary | ICD-10-CM | POA: Diagnosis not present

## 2019-12-27 DIAGNOSIS — I10 Essential (primary) hypertension: Secondary | ICD-10-CM | POA: Diagnosis not present

## 2019-12-27 DIAGNOSIS — E785 Hyperlipidemia, unspecified: Secondary | ICD-10-CM | POA: Diagnosis not present

## 2019-12-27 DIAGNOSIS — I480 Paroxysmal atrial fibrillation: Secondary | ICD-10-CM | POA: Diagnosis not present

## 2019-12-27 DIAGNOSIS — S40021A Contusion of right upper arm, initial encounter: Secondary | ICD-10-CM | POA: Diagnosis not present

## 2019-12-27 DIAGNOSIS — M1712 Unilateral primary osteoarthritis, left knee: Secondary | ICD-10-CM | POA: Diagnosis not present

## 2020-01-01 DIAGNOSIS — M25511 Pain in right shoulder: Secondary | ICD-10-CM | POA: Diagnosis not present

## 2020-01-01 DIAGNOSIS — I10 Essential (primary) hypertension: Secondary | ICD-10-CM | POA: Insufficient documentation

## 2020-01-01 DIAGNOSIS — Z6827 Body mass index (BMI) 27.0-27.9, adult: Secondary | ICD-10-CM

## 2020-01-01 HISTORY — DX: Body mass index (BMI) 27.0-27.9, adult: Z68.27

## 2020-01-02 ENCOUNTER — Telehealth: Payer: Self-pay | Admitting: Emergency Medicine

## 2020-01-02 DIAGNOSIS — T8484XA Pain due to internal orthopedic prosthetic devices, implants and grafts, initial encounter: Secondary | ICD-10-CM | POA: Diagnosis not present

## 2020-01-02 DIAGNOSIS — S46211A Strain of muscle, fascia and tendon of other parts of biceps, right arm, initial encounter: Secondary | ICD-10-CM | POA: Diagnosis not present

## 2020-01-02 NOTE — Telephone Encounter (Signed)
Dr. Agustin Cree to review. [name] has a h/o permanent atrial fibrillation, dilated cardiomyopathy with normalization of EF (last echo in Jan 2021), h/o pacemaker. Previous note from 09/02/2019 mentions "She is talking about potentially having surgery and she wants to know my opinion regarding that issue I told her if she decided to have a surgery done we need to evaluate her heart with echocardiogram as well as probably with stress testing.  She said she will let me know when she make up her mind."  Since last echo is in January 2021, do you want both myoview and echo prior to clearance?  Please send your response to P CV DIV PREOP

## 2020-01-02 NOTE — Telephone Encounter (Signed)
° °  [facility] Pre-operative Risk Assessment    HEARTCARE STAFF: - Please ensure there is not already an duplicate clearance open for this procedure. - Under Visit Info/Reason for Call, type in Other and utilize the format Clearance MM/DD/YY or Clearance TBD. Do not use dashes or single digits. - If request is for dental extraction, please clarify the # of teeth to be extracted.  Request for surgical clearance:  1. What type of surgery is being performed? Left total knee replacement    2. When is this surgery scheduled? TBD   3. What type of clearance is required (medical clearance vs. Pharmacy clearance to hold med vs. Both)? Medical   4. Are there any medications that need to be held prior to surgery and how long?None specified    5. Practice name and name of physician performing surgery? [facility] - Marchia Bond, MD   6. What is the office phone number? [phone]   7.   What is the office fax number? [phone]  8.   Anesthesia type (None, local, MAC, general) ? None specified    Senaida Ores 01/02/2020, 9:09 AM  _________________________________________________________________   (provider comments below)

## 2020-01-14 NOTE — Telephone Encounter (Signed)
Dr. Agustin Cree, please see note below regarding cardiac pre-op assessment.   Please direct response to P CV DIV PREOP  Thanks! Vernee Baines-Antavious Spanos, PA-C

## 2020-01-15 NOTE — Telephone Encounter (Signed)
Called patient. She will call back to make an appointment for stress test when ready. She has another appointment with surgeon first and would like to go to that before stress test.

## 2020-01-15 NOTE — Telephone Encounter (Signed)
If she decided proceed with surgery we need to do stress test.  So please call her find out if she does have already a date for surgery scheduled just the case then we will go ahead and do a [place]

## 2020-01-21 DIAGNOSIS — R051 Acute cough: Secondary | ICD-10-CM | POA: Diagnosis not present

## 2020-01-21 DIAGNOSIS — Z20828 Contact with and (suspected) exposure to other viral communicable diseases: Secondary | ICD-10-CM | POA: Diagnosis not present

## 2020-01-21 DIAGNOSIS — M791 Myalgia, unspecified site: Secondary | ICD-10-CM | POA: Diagnosis not present

## 2020-01-21 DIAGNOSIS — R5383 Other fatigue: Secondary | ICD-10-CM | POA: Diagnosis not present

## 2020-01-22 DIAGNOSIS — U071 COVID-19: Secondary | ICD-10-CM | POA: Diagnosis not present

## 2020-01-27 NOTE — Telephone Encounter (Signed)
Appt schedule 11/16 with Dr [facility]

## 2020-01-27 NOTE — Telephone Encounter (Addendum)
   Primary Cardiologist: Jenne Campus, MD  Chart reviewed as part of pre-operative protocol coverage. Appears she has an upcoming visit with Dr. Agustin Cree 02/03/20. Would be reasonable to readdress preoperative status at that visit given uncertain timeframe of procedure.  I will remove from the preoperative pool at this time.   Pre-op covering staff: - Please add "pre-op clearance" to the appointment notes so provider is aware. - Please contact requesting surgeon's office via preferred method (i.e, phone, fax) to inform them of need for appointment prior to surgery.  Abigail Butts, PA-C  01/27/2020, 8:12 AM

## 2020-02-03 ENCOUNTER — Ambulatory Visit: Payer: Medicare Other | Admitting: Cardiology

## 2020-02-15 DIAGNOSIS — J90 Pleural effusion, not elsewhere classified: Secondary | ICD-10-CM | POA: Diagnosis not present

## 2020-02-15 DIAGNOSIS — Z96651 Presence of right artificial knee joint: Secondary | ICD-10-CM | POA: Diagnosis not present

## 2020-02-15 DIAGNOSIS — R000 Tachycardia, unspecified: Secondary | ICD-10-CM | POA: Diagnosis not present

## 2020-02-15 DIAGNOSIS — I509 Heart failure, unspecified: Secondary | ICD-10-CM | POA: Diagnosis not present

## 2020-02-15 DIAGNOSIS — R911 Solitary pulmonary nodule: Secondary | ICD-10-CM | POA: Diagnosis not present

## 2020-02-15 DIAGNOSIS — M7989 Other specified soft tissue disorders: Secondary | ICD-10-CM | POA: Diagnosis not present

## 2020-02-15 DIAGNOSIS — J811 Chronic pulmonary edema: Secondary | ICD-10-CM | POA: Diagnosis not present

## 2020-02-15 DIAGNOSIS — R0602 Shortness of breath: Secondary | ICD-10-CM | POA: Diagnosis not present

## 2020-02-15 DIAGNOSIS — J9811 Atelectasis: Secondary | ICD-10-CM | POA: Diagnosis not present

## 2020-02-15 DIAGNOSIS — I4891 Unspecified atrial fibrillation: Secondary | ICD-10-CM | POA: Diagnosis not present

## 2020-02-15 DIAGNOSIS — Z471 Aftercare following joint replacement surgery: Secondary | ICD-10-CM | POA: Diagnosis not present

## 2020-02-15 DIAGNOSIS — I10 Essential (primary) hypertension: Secondary | ICD-10-CM | POA: Diagnosis not present

## 2020-02-15 DIAGNOSIS — R52 Pain, unspecified: Secondary | ICD-10-CM | POA: Diagnosis not present

## 2020-02-15 DIAGNOSIS — I517 Cardiomegaly: Secondary | ICD-10-CM | POA: Diagnosis not present

## 2020-02-15 DIAGNOSIS — M79604 Pain in right leg: Secondary | ICD-10-CM | POA: Diagnosis not present

## 2020-02-15 DIAGNOSIS — I110 Hypertensive heart disease with heart failure: Secondary | ICD-10-CM | POA: Diagnosis not present

## 2020-02-16 DIAGNOSIS — R0602 Shortness of breath: Secondary | ICD-10-CM | POA: Diagnosis not present

## 2020-02-16 DIAGNOSIS — Z8616 Personal history of COVID-19: Secondary | ICD-10-CM | POA: Diagnosis not present

## 2020-02-16 DIAGNOSIS — E876 Hypokalemia: Secondary | ICD-10-CM | POA: Diagnosis present

## 2020-02-16 DIAGNOSIS — M7989 Other specified soft tissue disorders: Secondary | ICD-10-CM | POA: Diagnosis not present

## 2020-02-16 DIAGNOSIS — D649 Anemia, unspecified: Secondary | ICD-10-CM | POA: Diagnosis present

## 2020-02-16 DIAGNOSIS — K224 Dyskinesia of esophagus: Secondary | ICD-10-CM | POA: Diagnosis present

## 2020-02-16 DIAGNOSIS — I517 Cardiomegaly: Secondary | ICD-10-CM | POA: Diagnosis not present

## 2020-02-16 DIAGNOSIS — J90 Pleural effusion, not elsewhere classified: Secondary | ICD-10-CM | POA: Diagnosis not present

## 2020-02-16 DIAGNOSIS — I110 Hypertensive heart disease with heart failure: Secondary | ICD-10-CM | POA: Diagnosis not present

## 2020-02-16 DIAGNOSIS — Z950 Presence of cardiac pacemaker: Secondary | ICD-10-CM | POA: Diagnosis not present

## 2020-02-16 DIAGNOSIS — M810 Age-related osteoporosis without current pathological fracture: Secondary | ICD-10-CM | POA: Diagnosis present

## 2020-02-16 DIAGNOSIS — Z79899 Other long term (current) drug therapy: Secondary | ICD-10-CM | POA: Diagnosis not present

## 2020-02-16 DIAGNOSIS — Z79891 Long term (current) use of opiate analgesic: Secondary | ICD-10-CM | POA: Diagnosis not present

## 2020-02-16 DIAGNOSIS — I482 Chronic atrial fibrillation, unspecified: Secondary | ICD-10-CM | POA: Diagnosis present

## 2020-02-16 DIAGNOSIS — J811 Chronic pulmonary edema: Secondary | ICD-10-CM | POA: Diagnosis not present

## 2020-02-16 DIAGNOSIS — I4891 Unspecified atrial fibrillation: Secondary | ICD-10-CM | POA: Diagnosis not present

## 2020-02-16 DIAGNOSIS — I340 Nonrheumatic mitral (valve) insufficiency: Secondary | ICD-10-CM | POA: Diagnosis not present

## 2020-02-16 DIAGNOSIS — I70203 Unspecified atherosclerosis of native arteries of extremities, bilateral legs: Secondary | ICD-10-CM | POA: Diagnosis not present

## 2020-02-16 DIAGNOSIS — I509 Heart failure, unspecified: Secondary | ICD-10-CM | POA: Diagnosis not present

## 2020-02-16 DIAGNOSIS — Z471 Aftercare following joint replacement surgery: Secondary | ICD-10-CM | POA: Diagnosis not present

## 2020-02-16 DIAGNOSIS — G8929 Other chronic pain: Secondary | ICD-10-CM | POA: Diagnosis not present

## 2020-02-16 DIAGNOSIS — Z7982 Long term (current) use of aspirin: Secondary | ICD-10-CM | POA: Diagnosis not present

## 2020-02-16 DIAGNOSIS — I361 Nonrheumatic tricuspid (valve) insufficiency: Secondary | ICD-10-CM | POA: Diagnosis not present

## 2020-02-16 DIAGNOSIS — R911 Solitary pulmonary nodule: Secondary | ICD-10-CM | POA: Diagnosis not present

## 2020-02-16 DIAGNOSIS — J9811 Atelectasis: Secondary | ICD-10-CM | POA: Diagnosis not present

## 2020-02-16 DIAGNOSIS — Z8673 Personal history of transient ischemic attack (TIA), and cerebral infarction without residual deficits: Secondary | ICD-10-CM | POA: Diagnosis not present

## 2020-02-16 DIAGNOSIS — I5033 Acute on chronic diastolic (congestive) heart failure: Secondary | ICD-10-CM | POA: Diagnosis not present

## 2020-02-16 DIAGNOSIS — Z96651 Presence of right artificial knee joint: Secondary | ICD-10-CM | POA: Diagnosis not present

## 2020-02-16 DIAGNOSIS — J189 Pneumonia, unspecified organism: Secondary | ICD-10-CM | POA: Diagnosis present

## 2020-02-16 DIAGNOSIS — M79604 Pain in right leg: Secondary | ICD-10-CM | POA: Diagnosis present

## 2020-02-16 DIAGNOSIS — I10 Essential (primary) hypertension: Secondary | ICD-10-CM | POA: Diagnosis present

## 2020-02-17 DIAGNOSIS — I482 Chronic atrial fibrillation, unspecified: Secondary | ICD-10-CM

## 2020-02-17 DIAGNOSIS — I509 Heart failure, unspecified: Secondary | ICD-10-CM

## 2020-02-17 DIAGNOSIS — Z950 Presence of cardiac pacemaker: Secondary | ICD-10-CM

## 2020-02-17 DIAGNOSIS — D649 Anemia, unspecified: Secondary | ICD-10-CM

## 2020-02-19 DIAGNOSIS — J189 Pneumonia, unspecified organism: Secondary | ICD-10-CM

## 2020-02-28 DIAGNOSIS — I5033 Acute on chronic diastolic (congestive) heart failure: Secondary | ICD-10-CM | POA: Diagnosis not present

## 2020-02-28 DIAGNOSIS — M79604 Pain in right leg: Secondary | ICD-10-CM | POA: Diagnosis not present

## 2020-02-28 DIAGNOSIS — I4891 Unspecified atrial fibrillation: Secondary | ICD-10-CM | POA: Diagnosis not present

## 2020-02-28 DIAGNOSIS — J189 Pneumonia, unspecified organism: Secondary | ICD-10-CM | POA: Diagnosis not present

## 2020-03-02 DIAGNOSIS — I5033 Acute on chronic diastolic (congestive) heart failure: Secondary | ICD-10-CM | POA: Diagnosis not present

## 2020-03-15 ENCOUNTER — Ambulatory Visit (INDEPENDENT_AMBULATORY_CARE_PROVIDER_SITE_OTHER): Payer: Medicare Other

## 2020-03-15 DIAGNOSIS — I4821 Permanent atrial fibrillation: Secondary | ICD-10-CM

## 2020-03-17 LAB — CUP PACEART REMOTE DEVICE CHECK
Battery Impedance: 2888 Ohm
Battery Remaining Longevity: 29 mo
Battery Voltage: 2.74 V
Brady Statistic RV Percent Paced: 11 %
Date Time Interrogation Session: 20211228125101
Implantable Lead Implant Date: 20120911
Implantable Lead Implant Date: 20120911
Implantable Lead Location: 753859
Implantable Lead Location: 753860
Implantable Lead Model: 4092
Implantable Lead Model: 5076
Implantable Pulse Generator Implant Date: 20120911
Lead Channel Impedance Value: 580 Ohm
Lead Channel Impedance Value: 67 Ohm
Lead Channel Pacing Threshold Amplitude: 0.5 V
Lead Channel Pacing Threshold Pulse Width: 0.4 ms
Lead Channel Setting Pacing Amplitude: 2 V
Lead Channel Setting Pacing Pulse Width: 0.4 ms
Lead Channel Setting Sensing Sensitivity: 5.6 mV

## 2020-03-22 ENCOUNTER — Telehealth: Payer: Self-pay

## 2020-03-22 NOTE — Telephone Encounter (Signed)
   Primary Cardiologist: Gypsy Balsam, MD  Chart reviewed as part of pre-operative protocol coverage. Because of [name]'s past medical history and time since last visit, she will require a follow-up visit in order to better assess preoperative cardiovascular risk.  Pre-op covering staff: - Please schedule appointment and call patient to inform them. Please add "pre-op clearance" to the appointment notes so provider is aware. - Please contact requesting surgeon's office via preferred method (i.e, phone, fax) to inform them of need for appointment prior to surgery.   Beatriz Stallion, PA-C  03/22/2020, 4:35 PM

## 2020-03-22 NOTE — Telephone Encounter (Signed)
Tried to call pt and schedule appt for cardiac clearance pt states that she is "going to put this off for awhile" she will call back and schedule appt later.

## 2020-03-22 NOTE — Telephone Encounter (Signed)
   [facility] Pre-operative Risk Assessment    Request for surgical clearance:  1. What type of surgery is being performed? Left Total Knee Replacement   2. When is this surgery scheduled? TBD   3. What type of clearance is required (medical clearance vs. Pharmacy clearance to hold med vs. Both)? Medical  4. Are there any medications that need to be held prior to surgery and how long? Not specified however, the patient is on ASA81   5. Practice name and name of physician performing surgery? Dr. Marchia Bond   6. What is your office phone number: [phone] ext [phone]    7.   What is your office fax number:[phone]  8.   Anesthesia type (None, local, MAC, general) ? Not specified.   [name] 03/22/2020, 7:59 AM  _________________________________________________________________   (provider comments below)

## 2020-03-22 NOTE — Telephone Encounter (Signed)
Forwarded to requesting party 

## 2020-03-26 NOTE — Progress Notes (Signed)
Remote pacemaker transmission.   

## 2020-03-27 ENCOUNTER — Emergency Department (HOSPITAL_COMMUNITY): Payer: Medicare Other

## 2020-03-27 ENCOUNTER — Emergency Department (HOSPITAL_COMMUNITY)
Admission: EM | Admit: 2020-03-27 | Discharge: 2020-03-27 | Disposition: A | Discharge status: Home / Self Care | Payer: Medicare Other | Attending: Emergency Medicine | Admitting: Emergency Medicine

## 2020-03-27 ENCOUNTER — Other Ambulatory Visit: Payer: Self-pay

## 2020-03-27 ENCOUNTER — Encounter (HOSPITAL_COMMUNITY): Payer: Self-pay | Admitting: Emergency Medicine

## 2020-03-27 ENCOUNTER — Emergency Department (HOSPITAL_BASED_OUTPATIENT_CLINIC_OR_DEPARTMENT_OTHER)
Admission: EM | Admit: 2020-03-27 | Discharge: 2020-03-27 | Disposition: A | Discharge status: Home / Self Care | Payer: Medicare Other | Source: Home / Self Care | Attending: Emergency Medicine | Admitting: Emergency Medicine

## 2020-03-27 DIAGNOSIS — M79604 Pain in right leg: Secondary | ICD-10-CM | POA: Diagnosis present

## 2020-03-27 DIAGNOSIS — Z950 Presence of cardiac pacemaker: Secondary | ICD-10-CM | POA: Diagnosis not present

## 2020-03-27 DIAGNOSIS — M7989 Other specified soft tissue disorders: Secondary | ICD-10-CM | POA: Diagnosis not present

## 2020-03-27 DIAGNOSIS — I119 Hypertensive heart disease without heart failure: Secondary | ICD-10-CM | POA: Insufficient documentation

## 2020-03-27 DIAGNOSIS — S79911A Unspecified injury of right hip, initial encounter: Secondary | ICD-10-CM | POA: Diagnosis not present

## 2020-03-27 DIAGNOSIS — Z9889 Other specified postprocedural states: Secondary | ICD-10-CM | POA: Diagnosis not present

## 2020-03-27 DIAGNOSIS — Z96611 Presence of right artificial shoulder joint: Secondary | ICD-10-CM | POA: Insufficient documentation

## 2020-03-27 DIAGNOSIS — M545 Low back pain, unspecified: Secondary | ICD-10-CM | POA: Diagnosis not present

## 2020-03-27 DIAGNOSIS — M79609 Pain in unspecified limb: Secondary | ICD-10-CM

## 2020-03-27 DIAGNOSIS — Z96651 Presence of right artificial knee joint: Secondary | ICD-10-CM | POA: Diagnosis not present

## 2020-03-27 DIAGNOSIS — Z7982 Long term (current) use of aspirin: Secondary | ICD-10-CM | POA: Insufficient documentation

## 2020-03-27 DIAGNOSIS — M5441 Lumbago with sciatica, right side: Secondary | ICD-10-CM | POA: Diagnosis not present

## 2020-03-27 MED ORDER — LIDOCAINE 5 % EX PTCH: 1.0000 | MEDICATED_PATCH | CUTANEOUS | 0 refills | Status: DC

## 2020-03-27 NOTE — ED Triage Notes (Signed)
Pt to triage via [facility] from home.  Reports R leg pain since November.  No known injury.  History of neuropathy.  Scheduled to see orthopedist on 1/18 but states she couldn't wait due to pain.

## 2020-03-27 NOTE — ED Notes (Signed)
Pt transferred to [place]

## 2020-03-27 NOTE — ED Provider Notes (Signed)
[facility]   CSN: [id number] Arrival date & time: 03/27/20  1238     History Chief Complaint  Patient presents with  . Leg Pain    [name] is a 87 y.o. female.  The history is provided by the patient and medical records. No language interpreter was used.  Leg Pain Location:  Leg Time since incident:  2 months Injury: yes   Mechanism of injury: fall   Pain details:    Quality:  Aching, burning and shooting   Radiates to:  R leg   Severity:  Severe   Onset quality:  Sudden   Timing:  Constant   Progression:  Waxing and waning Chronicity:  New Foreign body present:  No foreign bodies Tetanus status:  Unknown Worsened by:  Bearing weight Associated symptoms: back pain, muscle weakness (intermittent), numbness, swelling and tingling   Associated symptoms: no decreased ROM, no fatigue, no fever, no neck pain and no stiffness  Itching:          Past Medical History:  Diagnosis Date  . A-fib (HCC)   . Anemia   . Aortic valve disorder   . Arthritis    osteoarthritis - back and knee  . Benign hypertensive heart disease without heart failure   . Blood transfusion   . Cataract    bilateral  . Chronic back pain   . Diverticulosis   . GERD (gastroesophageal reflux disease)   . History of stomach ulcers   . Hyperlipidemia   . Hypertension   . Osteoarthrosis   . Pacemaker   . Rotator cuff tear arthropathy of right shoulder 11/16/2015  . Status post lumbar surgery   . Vitamin D deficiency     Patient Active Problem List   Diagnosis Date Noted  . Permanent atrial fibrillation (HCC) 02/11/2019  . Dilated cardiomyopathy (HCC) 09/17/2017  . Chronic atrial fibrillation (HCC) 07/12/2017  . Rotator cuff tear arthropathy of right shoulder 11/16/2015  . S/P shoulder replacement 11/16/2015  . Chest pain at rest 08/18/2014  . Pacemaker 08/18/2014  . Pacemaker reprogramming/check 08/18/2014  . Essential  hypertension 10/06/2013    Past Surgical History:  Procedure Laterality Date  . ABDOMINAL HYSTERECTOMY    . APPENDECTOMY    . BACK SURGERY     five back surgeries  . cataract extaction -left    . cataract extraction right    . CHOLECYSTECTOMY    . COLONOSCOPY    . CRANIOTOMY     ? subdural hematoma?  Sustained head injury after falling while taking Coumadin  . ESOPHAGOGASTRODUODENOSCOPY    . EYE SURGERY Bilateral    cataract surgery with lens implant  . FRACTURE SURGERY     left wrist, with plate and removal of plate  . INSERT / REPLACE / REMOVE PACEMAKER     [employer]  . KNEE ARTHROPLASTY     right knee  . REVERSE SHOULDER ARTHROPLASTY Right 11/16/2015  . REVERSE SHOULDER ARTHROPLASTY Right 11/16/2015   Procedure: REVERSE SHOULDER ARTHROPLASTY;  Surgeon: Teryl LucyJoshua Landau, MD;  Location[facility] OR;  Service: Orthopedics;  Laterality: Right;  . S1 vertebroplasty    . SHOULDER OPEN ROTATOR CUFF REPAIR  right     OB History   No obstetric history on file.     Family History  Problem Relation Age of Onset  . Uterine cancer Mother   . Diabetes Mother   . Alcoholism Father   . Diabetes Brother   . Throat  cancer Brother   . Diabetes Brother   . Diabetes Brother     Social History   Tobacco Use  . Smoking status: Never Smoker  . Smokeless tobacco: Never Used  Vaping Use  . Vaping Use: Never used  Substance Use Topics  . Alcohol use: No  . Drug use: No    Home Medications Prior to Admission medications   Medication Sig Start Date End Date Taking? Authorizing Provider  alendronate (FOSAMAX) 70 MG tablet Take 70 mg by mouth once a week. Take with a full glass of water on an empty stomach.    [provider]  aspirin EC 81 MG tablet Take 81 mg by mouth at bedtime.      [provider]  Calcium Carb-Cholecalciferol (CALCIUM-VITAMIN D) 500-200 MG-UNIT tablet Take 1 tablet 2 (two) times daily by mouth.    [provider]  Coenzyme Q10 100 MG  capsule Take 100 mg daily by mouth.     [provider]  cyanocobalamin ([employer] VITAMIN B12) 2000 MCG tablet Take 2,000 mcg by mouth daily.    [provider]  diltiazem (CARDIZEM CD) 240 MG 24 hr capsule Take 1 capsule (240 mg total) by mouth daily. 09/12/19   Park Liter, MD  famotidine (PEPCID) 40 MG tablet Take 40 mg by mouth daily. 01/16/17   [provider]  furosemide (LASIX) 40 MG tablet TAKE 1/2 TABLET BY MOUTH DAILY 08/12/19   Park Liter, MD  gabapentin (NEURONTIN) 100 MG capsule Take 100 mg by mouth daily.  06/05/16   [provider]  ibuprofen (ADVIL) 800 MG tablet Take 800 mg by mouth every 8 (eight) hours as needed.    [provider]  Kelp 100 MG TABS Take 100 mg by mouth daily.    [provider]  Multiple Vitamin (MULTIVITAMIN) tablet Take 1 tablet by mouth daily.    [provider]  Oxycodone HCl 10 MG TABS TK 1 TO 2 TS PO Q 4 TO 6 H 09/30/18   [provider]  pantoprazole (PROTONIX) 40 MG tablet Take 40 mg by mouth daily. 01/16/17   [provider]  sennosides-docusate sodium (SENOKOT-S) 8.6-50 MG tablet Take 2 tablets by mouth daily. Patient taking differently: Take 2 tablets by mouth as needed.  11/16/15   Marchia Bond, MD  triamcinolone (KENALOG) 0.025 % cream APP EXT AA BID 07/31/18   [provider]  valsartan (DIOVAN) 320 MG tablet Take 0.5 tablets (160 mg total) by mouth daily. 09/12/19   Park Liter, MD    Allergies    Amlodipine, Levofloxacin, and Penicillins  Review of Systems   Review of Systems  Constitutional: Negative for chills, fatigue and fever.  HENT: Negative for congestion.   Eyes: Negative for visual disturbance.  Respiratory: Negative for cough, chest tightness, shortness of breath and wheezing.   Cardiovascular: Positive for leg swelling. Negative for chest pain and palpitations.  Gastrointestinal: Negative for abdominal pain, constipation,  diarrhea, nausea and vomiting.  Genitourinary: Negative for dysuria and flank pain.  Musculoskeletal: Positive for back pain. Negative for neck pain, neck stiffness and stiffness.  Skin: Negative for rash and wound. Itching:    Neurological: Positive for weakness and numbness (tingle). Negative for dizziness, syncope, light-headedness and headaches.  Psychiatric/Behavioral: Negative for agitation and confusion.  All other systems reviewed and are negative.   Physical Exam Updated Vital Signs BP (!) 141/65 (BP Location: Left Arm)   Pulse 72   Temp 98.1  F (36.7 C) (Oral)   Resp 16   SpO2 91% Comment: denies SOB or home O2 use.   Physical Exam Vitals and nursing note reviewed.  Constitutional:      General: She is not in acute distress.    Appearance: She is well-developed and well-nourished. She is not ill-appearing, toxic-appearing or diaphoretic.  HENT:     Head: Normocephalic and atraumatic.  Eyes:     Conjunctiva/sclera: Conjunctivae normal.  Cardiovascular:     Rate and Rhythm: Normal rate and regular rhythm.     Heart sounds: No murmur heard.   Pulmonary:     Effort: Pulmonary effort is normal. No respiratory distress.     Breath sounds: Normal breath sounds. No stridor. No wheezing, rhonchi or rales.  Chest:     Chest wall: No tenderness.  Abdominal:     General: Abdomen is flat.     Palpations: Abdomen is soft.     Tenderness: There is no abdominal tenderness. There is no right CVA tenderness, left CVA tenderness, guarding or rebound.  Musculoskeletal:        General: Tenderness present.     Cervical back: Neck supple. No tenderness.     Right lower leg: Edema present.     Left lower leg: Edema present.  Skin:    General: Skin is warm and dry.     Capillary Refill: Capillary refill takes less than 2 seconds.     Findings: No erythema.  Neurological:     Mental Status: She is alert.     Sensory: No sensory deficit.     Motor: Weakness present.  Psychiatric:         Mood and Affect: Mood and affect and mood normal.     ED Results / Procedures / Treatments   Labs (all labs ordered are listed, but only abnormal results are displayed) Labs Reviewed - No data to display  EKG None  Radiology CT Lumbar Spine Wo Contrast  Result Date: 03/27/2020 CLINICAL DATA:  Trauma with right-sided back and leg pain. EXAM: CT LUMBAR SPINE WITHOUT CONTRAST TECHNIQUE: Multidetector CT imaging of the lumbar spine was performed without intravenous contrast administration. Multiplanar CT image reconstructions were also generated. COMPARISON:  01/04/2018 FINDINGS: Segmentation: 5 lumbar type vertebral bodies as numbered previously. Alignment: No traumatic malalignment. Spinal fusion from the thoracic region extending down L5. Vertebrae: Solid union throughout the region studied, from T10 through L5. No evidence of nonunion. No hardware loosening or hardware fracture. Previous sacroplasty on both sides as seen previously. Paraspinal and other soft tissues: Arterial atherosclerosis. Otherwise negative. Disc levels: No evidence canal or foraminal stenosis at L4-5 or above. At L5-S1, there is chronic disc degeneration with vacuum phenomenon. There is chronic facet arthropathy with a malformed facet region on the left. There is chronic degenerative anterolisthesis of 2 mm. There is newly seen extruded nitrogen gas within the intervertebral foramen on the right. There may be some associated disc material. Particularly if the patient has right L5 distribution pain, this is probably the significant finding. No evidence of new sacral fracture. Ordinary osteoarthritis affects the sacroiliac joints. IMPRESSION: 1. No evidence of recent fracture or hardware failure. 2. Solid union from T10 through L5 with sufficient patency of the canal and foramina at those levels. 3. L5-S1 chronic disc degeneration and facet arthropathy with a malformed facet region on the left. Newly seen extruded nitrogen  gas within the intervertebral foramen on the right. There may be some associated disc material. Particularly if  the patient has right L5 distribution pain, this is probably the significant finding. Electronically Signed   By: Nelson Chimes M.D.   On: 03/27/2020 17:10   DG Hip Unilat W or Wo Pelvis 2-3 Views Right  Result Date: 03/27/2020 CLINICAL DATA:  Pain after trauma EXAM: DG HIP (WITH OR WITHOUT PELVIS) 2-3V RIGHT COMPARISON:  None. FINDINGS: There appears to be a circumferential osteophyte associated with the proximal right femur at the junction of the head and neck. No convincing evidence of fracture. No dislocation. Degenerative changes in the left hip. IMPRESSION: No convincing evidence of fracture or dislocation. Probable osteophyte formation at the junction of the head and neck of the right femur. Electronically Signed   By: Dorise Bullion III M.D   On: 03/27/2020 17:53   VAS [place] LOWER EXTREMITY VENOUS (DVT) (ONLY [facility] & [facility])  Result Date: 03/27/2020  Lower Venous DVT Study Indications: Pain, and Swelling.  Risk Factors: None identified. Limitations: Body habitus and poor ultrasound/tissue interface. Comparison Study: No prior studies. Performing Technologist: Oliver Hum RVT  Examination Guidelines: A complete evaluation includes B-mode imaging, spectral Doppler, color Doppler, and power Doppler as needed of all accessible portions of each vessel. Bilateral testing is considered an integral part of a complete examination. Limited examinations for reoccurring indications may be performed as noted. The reflux portion of the exam is performed with the patient in reverse Trendelenburg.  +---------+---------------+---------+-----------+----------+--------------+ RIGHT    CompressibilityPhasicitySpontaneityPropertiesThrombus Aging +---------+---------------+---------+-----------+----------+--------------+ CFV      Full           Yes      Yes                                  +---------+---------------+---------+-----------+----------+--------------+ SFJ      Full                                                        +---------+---------------+---------+-----------+----------+--------------+ FV Prox  Full                                                        +---------+---------------+---------+-----------+----------+--------------+ FV Mid                  Yes      Yes                                 +---------+---------------+---------+-----------+----------+--------------+ FV DistalFull           Yes      Yes                                 +---------+---------------+---------+-----------+----------+--------------+ PFV      Full                                                        +---------+---------------+---------+-----------+----------+--------------+  POP      Full           Yes      Yes                                 +---------+---------------+---------+-----------+----------+--------------+ PTV      Full                                                        +---------+---------------+---------+-----------+----------+--------------+ PERO     Full                                                        +---------+---------------+---------+-----------+----------+--------------+   +----+---------------+---------+-----------+----------+--------------+ LEFTCompressibilityPhasicitySpontaneityPropertiesThrombus Aging +----+---------------+---------+-----------+----------+--------------+ CFV Full           Yes      Yes                                 +----+---------------+---------+-----------+----------+--------------+     Summary: RIGHT: - There is no evidence of deep vein thrombosis in the lower extremity. However, portions of this examination were limited- see technologist comments above.  - No cystic structure found in the popliteal fossa.  LEFT: - No evidence of common femoral vein obstruction.  *See table(s)  above for measurements and observations.    Preliminary     Procedures Procedures (including critical care time)  Medications Ordered in ED Medications - No data to display  ED Course  I have reviewed the triage vital signs and the nursing notes.  Pertinent labs & imaging results that were available during my care of the patient were reviewed by me and considered in my medical decision making (see chart for details).    MDM Rules/Calculators/A&P                          FOYE LORENCE is a 87 y.o. female with a past medical history significant for chronic atrial fibrillation, dilated cardiomyopathy status post pacemaker and cannot get MRIs reportedly, hypertension, hyperlipidemia, osteoarthritis, and report of 5 previous back surgeries who presents with severe right lower back pain radiating down her right leg with tingling, numbness, and some weakness and right leg swelling.  Patient reports that back in November when she had COVID, she had a fall landing on her back and has had pain in her right back going down her right leg ever since.  She reports she has not had any imaging since that time and is scheduled to see her back doctor in 10 days.  She says that the pain has continued to be persistent and as it has been 10 out of 10 all day today whenever she tries to walk, she wanted to come in for evaluation.  She denies loss of bowel or bladder control but does report she is having persistent tingling and episodes where he feels weak.  She reports it is painful when she tries to lift her leg.  She also reports pain in her right hip and swelling in  her right leg that is worse than her left leg.  She reports he always has some leg swelling with her heart failure and she takes fluid pills.  She denies fevers, chills, chest pain, shortness of breath, nausea, vomiting, constipation, diarrhea, or urinary changes.  On exam, lungs are clear and chest is nontender.  Abdomen is nontender.  Back is  minimally tender in the right lower back and not midline.  Tenderness also present in her right hip and her right calf.  Legs are edematous bilaterally but worse on the right than left.  Intact DP and PT pulse bilaterally.  Symmetric sensation although she reports tingling comes and goes in the right leg.  She had slightly decreased strength with knee extension of her lower leg on right compared to left.  Straight leg raise positive on the right.  Clinically I am somewhat concerned as the patient reports has had some any back surgeries and has had severe back pain going down her right leg with occasional numbness and weakness worsens with walking and has not had any imaging since this fall.  I am also concerned about the swelling.  We will get ultrasound to rule out DVT in the right leg and will get imaging of her right hip and CT of her L-spine.  She reports she cannot get MRIs due to her pacemaker.  If imaging is reassuring, anticipate she will follow-up with her neurosurgical back surgery team in 10 days for outpatient work-up.  Anticipate reassessment after work-up.  7:31 PM Work-up has returned.  She is feeling better.  X-ray shows no evidence of fracture or dislocation of the hip.  Some arthritis is seen in osteophyte.  Ultrasound showed no DVT.  CT scan showed some disc material and nitrogen gas bubbles near the L5 foramen and nerve on the right suspicious for the source of her discomfort.  I spoke with neurosurgery who evaluated the images and feel that she is safe for follow-up with her neurosurgeon, Dr. Saintclair Halsted in 10 days.  He agreed with outpatient pain and symptom escalation and close follow-up.  I had a discussion with the patient and her granddaughter and we will add a Lidoderm patch to her Percocet regimen.  They understand return precautions and follow-up instructions.  She had no other questions or concerns and was discharged in good condition.   Final Clinical Impression(s) / ED  Diagnoses Final diagnoses:  Right-sided low back pain with right-sided sciatica, unspecified chronicity    Rx / DC Orders ED Discharge Orders         Ordered    lidocaine (LIDODERM) 5 %  Every 24 hours        03/27/20 1934          Clinical Impression: 1. Right-sided low back pain with right-sided sciatica, unspecified chronicity     Disposition: Discharge  Condition: Good  I have discussed the results, Dx and Tx plan with the pt(& family if present). He/she/they expressed understanding and agree(s) with the plan. Discharge instructions discussed at great length. Strict return precautions discussed and pt &/or family have verbalized understanding of the instructions. No further questions at time of discharge.    New Prescriptions   LIDOCAINE (LIDODERM) 5 %    Place 1 patch onto the skin daily. Remove & Discard patch within 12 hours or as directed by MD    Follow Up: Kary Kos, MD [facility] [address] [place] [address] [phone]  Camisha Srey, Gwenyth Allegra, MD 03/27/20 Joen Laura

## 2020-03-27 NOTE — Discharge Instructions (Signed)
Your images today showed no evidence of blood clot and no evidence of fracture or dislocation in your hip.  The CT scan did show the arthritis as well as the nitrogen gas and disc material likely irritating your nerves causing the discomfort and symptoms.  We spoke with neurosurgery who feel you are safe for discharge home.  Please use the Lidoderm patches and add it to your home pain regimen.  Please follow-up with Dr. Saintclair Halsted with your neurosurgery team.  If any symptoms change or worsen or you develop new symptoms, please return to the nearest emergency department.

## 2020-03-27 NOTE — Progress Notes (Signed)
Right lower extremity venous duplex has been completed. Preliminary results can be found in CV Proc through chart review.  Results were given to Dr. Sherry Ruffing.  03/27/20 4:57 PM [name] RVT

## 2020-04-06 DIAGNOSIS — I10 Essential (primary) hypertension: Secondary | ICD-10-CM | POA: Diagnosis not present

## 2020-04-06 DIAGNOSIS — M5126 Other intervertebral disc displacement, lumbar region: Secondary | ICD-10-CM | POA: Diagnosis not present

## 2020-04-06 DIAGNOSIS — Z6825 Body mass index (BMI) 25.0-25.9, adult: Secondary | ICD-10-CM | POA: Diagnosis not present

## 2020-04-19 DIAGNOSIS — M48061 Spinal stenosis, lumbar region without neurogenic claudication: Secondary | ICD-10-CM

## 2020-04-19 DIAGNOSIS — F1120 Opioid dependence, uncomplicated: Secondary | ICD-10-CM

## 2020-04-19 DIAGNOSIS — Z79899 Other long term (current) drug therapy: Secondary | ICD-10-CM

## 2020-04-19 HISTORY — DX: Spinal stenosis, lumbar region without neurogenic claudication: M48.061

## 2020-04-19 HISTORY — DX: Other long term (current) drug therapy: Z79.899

## 2020-04-19 HISTORY — DX: Opioid dependence, uncomplicated: F11.20

## 2020-04-23 DIAGNOSIS — M48061 Spinal stenosis, lumbar region without neurogenic claudication: Secondary | ICD-10-CM | POA: Diagnosis not present

## 2020-04-26 ENCOUNTER — Other Ambulatory Visit: Payer: Self-pay | Admitting: Cardiology

## 2020-06-01 ENCOUNTER — Other Ambulatory Visit: Payer: Self-pay

## 2020-06-01 DIAGNOSIS — K5790 Diverticulosis of intestine, part unspecified, without perforation or abscess without bleeding: Secondary | ICD-10-CM | POA: Insufficient documentation

## 2020-06-01 DIAGNOSIS — I119 Hypertensive heart disease without heart failure: Secondary | ICD-10-CM | POA: Insufficient documentation

## 2020-06-01 DIAGNOSIS — Z8711 Personal history of peptic ulcer disease: Secondary | ICD-10-CM | POA: Insufficient documentation

## 2020-06-01 DIAGNOSIS — I10 Essential (primary) hypertension: Secondary | ICD-10-CM | POA: Insufficient documentation

## 2020-06-01 DIAGNOSIS — E559 Vitamin D deficiency, unspecified: Secondary | ICD-10-CM | POA: Insufficient documentation

## 2020-06-01 DIAGNOSIS — Z9889 Other specified postprocedural states: Secondary | ICD-10-CM | POA: Insufficient documentation

## 2020-06-01 DIAGNOSIS — K219 Gastro-esophageal reflux disease without esophagitis: Secondary | ICD-10-CM | POA: Insufficient documentation

## 2020-06-01 DIAGNOSIS — D649 Anemia, unspecified: Secondary | ICD-10-CM | POA: Insufficient documentation

## 2020-06-01 DIAGNOSIS — M1990 Unspecified osteoarthritis, unspecified site: Secondary | ICD-10-CM | POA: Insufficient documentation

## 2020-06-01 DIAGNOSIS — G8929 Other chronic pain: Secondary | ICD-10-CM | POA: Insufficient documentation

## 2020-06-01 DIAGNOSIS — I359 Nonrheumatic aortic valve disorder, unspecified: Secondary | ICD-10-CM | POA: Insufficient documentation

## 2020-06-01 DIAGNOSIS — E785 Hyperlipidemia, unspecified: Secondary | ICD-10-CM | POA: Insufficient documentation

## 2020-06-03 ENCOUNTER — Ambulatory Visit (INDEPENDENT_AMBULATORY_CARE_PROVIDER_SITE_OTHER): Payer: Medicare Other | Admitting: Cardiology

## 2020-06-03 ENCOUNTER — Other Ambulatory Visit: Payer: Self-pay

## 2020-06-03 ENCOUNTER — Encounter: Payer: Self-pay | Admitting: Cardiology

## 2020-06-03 VITALS — BP 150/76 | HR 131 | Ht 60.0 in | Wt 137.2 lb

## 2020-06-03 DIAGNOSIS — I420 Dilated cardiomyopathy: Secondary | ICD-10-CM

## 2020-06-03 DIAGNOSIS — I10 Essential (primary) hypertension: Secondary | ICD-10-CM

## 2020-06-03 DIAGNOSIS — I5041 Acute combined systolic (congestive) and diastolic (congestive) heart failure: Secondary | ICD-10-CM | POA: Diagnosis not present

## 2020-06-03 DIAGNOSIS — L02212 Cutaneous abscess of back [any part, except buttock]: Secondary | ICD-10-CM | POA: Diagnosis not present

## 2020-06-03 DIAGNOSIS — L02419 Cutaneous abscess of limb, unspecified: Secondary | ICD-10-CM | POA: Diagnosis not present

## 2020-06-03 DIAGNOSIS — I509 Heart failure, unspecified: Secondary | ICD-10-CM | POA: Insufficient documentation

## 2020-06-03 DIAGNOSIS — I5043 Acute on chronic combined systolic (congestive) and diastolic (congestive) heart failure: Secondary | ICD-10-CM

## 2020-06-03 DIAGNOSIS — Z950 Presence of cardiac pacemaker: Secondary | ICD-10-CM | POA: Diagnosis not present

## 2020-06-03 DIAGNOSIS — I4821 Permanent atrial fibrillation: Secondary | ICD-10-CM

## 2020-06-03 HISTORY — DX: Heart failure, unspecified: I50.9

## 2020-06-03 MED ORDER — FUROSEMIDE 40 MG PO TABS: 60.0000 mg | ORAL_TABLET | Freq: Every day | ORAL | 3 refills | Status: DC

## 2020-06-03 NOTE — Progress Notes (Signed)
Cardiology Office Note:    Date:  06/03/2020   ID:  [name], [name] 05-16-1931, MRN [id number]  PCP:  Nicoletta Dress, MD  Cardiologist:  Jenne Campus, MD    Referring MD: Nicoletta Dress, MD   Chief Complaint  Patient presents with  . Follow-up  Have swelling legs  History of Present Illness:    [name] is a 87 y.o. female with complex past medical history.  That include dilated cardiomyopathy with normalization, however latest echocardiogram I see is from November of last year showing ejection fraction 45%, permanent atrial fibrillation, refusing anticoagulation, pacemaker present which is Medtronic device.  She also got congestive heart failure.  She comes today to my office after not being seen for almost 9 months.  She does have a lot of issues for several her legs are swollen and have some abrasions on her legs.  Described to have some chronic back pain which actually brought her to the hospital once.  It looks like work-up has been negative.  She said she got difficulty laying flat not because of shortness of breath but because of pain in her back.  She is taking diuretic she is supposed to be taking 40 mg twice daily but does not do it she takes only 40 mg daily and not all the time.  She said she hated the fact that she has to get up in the middle of the night and urinate.  On top of that she does have problem with hypokalemia.  She complained that she can only take 1 tablet of potassium daily.  Overall it is quite difficult situation.  On top of that today she is in atrial fibrillation with fast ventricular rate.  She is refusing anticoagulation.  Past Medical History:  Diagnosis Date  . A-fib ([place])   . Anemia   . Aortic valve disorder   . Arthritis    osteoarthritis - back and knee  . Benign hypertensive heart disease without heart failure   . Blood transfusion   . Cataract    bilateral  . Chest pain at rest 08/18/2014  . Chronic atrial fibrillation  ([place]) 07/12/2017  . Chronic back pain   . Diverticulosis   . Essential hypertension 10/06/2013  . GERD (gastroesophageal reflux disease)   . History of stomach ulcers   . Hyperlipidemia   . Hypertension   . Osteoarthrosis   . Pacemaker   . Paroxysmal atrial fibrillation ([place]) 08/18/2014   Formatting of this note might be different from the original. Not anticoagulated secondary to intracranial bleed while on coumadin  . Rotator cuff tear arthropathy of right shoulder 11/16/2015  . S/P shoulder replacement 11/16/2015  . Shoulder impingement 05/17/2014  . Status post lumbar surgery   . Vitamin D deficiency     Past Surgical History:  Procedure Laterality Date  . ABDOMINAL HYSTERECTOMY    . APPENDECTOMY    . BACK SURGERY     five back surgeries  . cataract extaction -left    . cataract extraction right    . CHOLECYSTECTOMY    . COLONOSCOPY    . CRANIOTOMY     ? subdural hematoma?  Sustained head injury after falling while taking Coumadin  . ESOPHAGOGASTRODUODENOSCOPY    . EYE SURGERY Bilateral    cataract surgery with lens implant  . FRACTURE SURGERY     left wrist, with plate and removal of plate  . INSERT / REPLACE / REMOVE PACEMAKER  Medtronic  . KNEE ARTHROPLASTY     right knee  . REVERSE SHOULDER ARTHROPLASTY Right 11/16/2015  . REVERSE SHOULDER ARTHROPLASTY Right 11/16/2015   Procedure: REVERSE SHOULDER ARTHROPLASTY;  Surgeon: Marchia Bond, MD;  Location: [facility];  Service: Orthopedics;  Laterality: Right;  . S1 vertebroplasty    . SHOULDER OPEN ROTATOR CUFF REPAIR  right    Current Medications: Current Meds  Medication Sig  . alendronate (FOSAMAX) 70 MG tablet Take 70 mg by mouth once a week. Take with a full glass of water on an empty stomach.  . ALPRAZolam (XANAX) 0.25 MG tablet Take 0.25 mg by mouth 3 (three) times daily as needed.  Marland Kitchen aspirin EC 81 MG tablet Take 81 mg by mouth at bedtime.  . Calcium Carb-Cholecalciferol (CALCIUM-VITAMIN D) 500-200 MG-UNIT  tablet Take 1 tablet 2 (two) times daily by mouth.  . Coenzyme Q10 100 MG capsule Take 100 mg daily by mouth.   . cyanocobalamin 2000 MCG tablet Take 2,000 mcg by mouth daily.  Marland Kitchen diltiazem (CARDIZEM CD) 240 MG 24 hr capsule TAKE 1 CAPSULE BY MOUTH EVERY DAY  . famotidine (PEPCID) 40 MG tablet Take 40 mg by mouth daily.  . furosemide (LASIX) 40 MG tablet TAKE 1/2 TABLET BY MOUTH DAILY  . ibuprofen (ADVIL) 800 MG tablet Take 800 mg by mouth every 8 (eight) hours as needed.  Marland Kitchen Kelp 100 MG TABS Take 100 mg by mouth daily.  . metoprolol tartrate (LOPRESSOR) 50 MG tablet Take 50 mg by mouth 2 (two) times daily.  . Multiple Vitamin (MULTIVITAMIN) tablet Take 1 tablet by mouth daily.  . ondansetron (ZOFRAN) 4 MG tablet Take 4 mg by mouth every 8 (eight) hours as needed for nausea or vomiting.  Marland Kitchen oxyCODONE-acetaminophen (PERCOCET) 10-325 MG tablet Take 1 tablet by mouth every 6 (six) hours as needed for pain.  . pantoprazole (PROTONIX) 40 MG tablet Take 40 mg by mouth daily.  . sennosides-docusate sodium (SENOKOT-S) 8.6-50 MG tablet Take 2 tablets by mouth daily. (Patient taking differently: Take 2 tablets by mouth as needed.)  . triamcinolone (KENALOG) 0.025 % cream APP EXT AA BID     Allergies:   Amlodipine, Levofloxacin, and Penicillins   Social History   Socioeconomic History  . Marital status: Widowed    Spouse name: Not on file  . Number of children: 4  . Years of education: 68  . Highest education level: Not on file  Occupational History  . Occupation: retired  Tobacco Use  . Smoking status: Never Smoker  . Smokeless tobacco: Never Used  Vaping Use  . Vaping Use: Never used  Substance and Sexual Activity  . Alcohol use: No  . Drug use: No  . Sexual activity: Not on file  Other Topics Concern  . Not on file  Social History Narrative   Lives alone , One story   Right handed   She retired as a Regulatory affairs officer.    Social Determinants of Health   Financial Resource Strain: Not on  file  Food Insecurity: Not on file  Transportation Needs: Not on file  Physical Activity: Not on file  Stress: Not on file  Social Connections: Not on file     Family History: The patient's family history includes Alcoholism in her father; Diabetes in her brother, brother, brother, and mother; Throat cancer in her brother; Uterine cancer in her mother. ROS:   Please see the history of present illness.    All 14 point review of systems negative  except as described per history of present illness  EKGs/Labs/Other Studies Reviewed:      Recent Labs: No results found for requested labs within last 8760 hours.  Recent Lipid Panel No results found for: CHOL, TRIG, HDL, CHOLHDL, VLDL, LDLCALC, LDLDIRECT  Physical Exam:    VS:  BP (!) 150/76 (BP Location: Right Arm, Patient Position: Sitting)   Pulse (!) 131   Ht 5' (1.524 m)   Wt 137 lb 3.2 oz (62.2 kg)   SpO2 94%   BMI 26.80 kg/m     Wt Readings from Last 3 Encounters:  06/03/20 137 lb 3.2 oz (62.2 kg)  09/02/19 141 lb (64 kg)  07/22/19 145 lb (65.8 kg)     GEN:  Well nourished, well developed in no acute distress HEENT: Normal NECK: No JVD; No carotid bruits LYMPHATICS: No lymphadenopathy CARDIAC: Irregular irregular, no murmurs, no rubs, no gallops RESPIRATORY:  Clear to auscultation without rales, wheezing or rhonchi  ABDOMEN: Soft, non-tender, non-distended MUSCULOSKELETAL:  No edema; No deformity  SKIN: Warm and dry LOWER EXTREMITIES: 2+ swelling bilaterally NEUROLOGIC:  Alert and oriented x 3 PSYCHIATRIC:  Normal affect   ASSESSMENT:    1. Essential hypertension   2. Dilated cardiomyopathy ([place])   3. Pacemaker   4. Permanent atrial fibrillation ([place])   5. Acute on chronic combined systolic and diastolic congestive heart failure ([place])    PLAN:    In order of problems listed above:  1. Congestive heart failure appears to be decompensated.  I will encourage her to start taking 60 mg of furosemide every  single day.  I will continue with her potassium.  Today she has an appointment with her primary care physician who is planning to do some laboratory test I would recommend to do Chem-7 to check for potassium as well as kidney function.  I will bring her back to my office in about a week to recheck her kidney function as well as potassium. 2. Essential hypertension blood pressure elevated hopefully with good diuresis blood pressure will improve. 3. History of dilated cardiomyopathy with latest estimation of ejection fraction 45%.  Hopefully I will be able to put her on appropriate medication to improve ejection fraction.  Previously ejection fraction was normal, I suspect deterioration of ejection fraction could be tachycardia related. 4. Atrial fibrillation with poorly controlled ventricular rate.  She does not want to change any of her medication.  I hope that with diuresis will improve her congestive heart failure and her heart rate will be better controlled. 5. She will require careful follow-up.  I will see him back in my office in about 3 weeks.  I told him situation get worse she need to go to the emergency room. 6. I did review her pacemaker interrogation she has about 28 months left in the device, normal function.   Medication Adjustments/Labs and Tests Ordered: Current medicines are reviewed at length with the patient today.  Concerns regarding medicines are outlined above.  No orders of the defined types were placed in this encounter.  Medication changes: No orders of the defined types were placed in this encounter.   Signed, Park Liter, MD, [facility] 06/03/2020 1:18 PM    [place]

## 2020-06-03 NOTE — Addendum Note (Signed)
Addended by: Resa Miner I on: 06/03/2020 01:31 PM   Modules accepted: Orders

## 2020-06-03 NOTE — Patient Instructions (Addendum)
Medication Instructions:  Your physician has recommended you make the following change in your medication:  INCREASE: Furosemide 60 mg per 1.5 tablets by mouth daily.  *If you need a refill on your cardiac medications before your next appointment, please call your pharmacy*   Lab Work: Your physician recommends that you return for lab work in: 1 WEEK BMP If you have labs (blood work) drawn today and your tests are completely normal, you will receive your results only by: Marland Kitchen MyChart Message (if you have MyChart) OR . A paper copy in the mail If you have any lab test that is abnormal or we need to change your treatment, we will call you to review the results.   Testing/Procedures: None   Follow-Up: At [facility], you and your health needs are our priority.  As part of our continuing mission to provide you with exceptional heart care, we have created designated Provider Care Teams.  These Care Teams include your primary Cardiologist (physician) and Advanced Practice Providers (APPs -  Physician Assistants and Nurse Practitioners) who all work together to provide you with the care you need, when you need it.  We recommend signing up for the patient portal called "MyChart".  Sign up information is provided on this After Visit Summary.  MyChart is used to connect with patients for Virtual Visits (Telemedicine).  Patients are able to view lab/test results, encounter notes, upcoming appointments, etc.  Non-urgent messages can be sent to your provider as well.   To learn more about what you can do with MyChart, go to [email or web address].    Your next appointment:   3 week(s)  The format for your next appointment:   In Person  Provider:   Jenne Campus, MD   Other Instructions

## 2020-06-14 ENCOUNTER — Ambulatory Visit (INDEPENDENT_AMBULATORY_CARE_PROVIDER_SITE_OTHER): Payer: Medicare Other

## 2020-06-14 DIAGNOSIS — I4821 Permanent atrial fibrillation: Secondary | ICD-10-CM

## 2020-06-14 DIAGNOSIS — I420 Dilated cardiomyopathy: Secondary | ICD-10-CM | POA: Diagnosis not present

## 2020-06-15 LAB — CUP PACEART REMOTE DEVICE CHECK
Battery Impedance: 2896 Ohm
Battery Remaining Longevity: 29 mo
Battery Voltage: 2.72 V
Brady Statistic RV Percent Paced: 9 %
Date Time Interrogation Session: 20220329113614
Implantable Lead Implant Date: 20120911
Implantable Lead Implant Date: 20120911
Implantable Lead Location: 753859
Implantable Lead Location: 753860
Implantable Lead Model: 4092
Implantable Lead Model: 5076
Implantable Pulse Generator Implant Date: 20120911
Lead Channel Impedance Value: 608 Ohm
Lead Channel Impedance Value: 67 Ohm
Lead Channel Pacing Threshold Amplitude: 0.5 V
Lead Channel Pacing Threshold Pulse Width: 0.4 ms
Lead Channel Setting Pacing Amplitude: 2 V
Lead Channel Setting Pacing Pulse Width: 0.4 ms
Lead Channel Setting Sensing Sensitivity: 5.6 mV

## 2020-06-28 NOTE — Progress Notes (Signed)
Remote pacemaker transmission.   

## 2020-06-29 DIAGNOSIS — E876 Hypokalemia: Secondary | ICD-10-CM | POA: Diagnosis not present

## 2020-06-29 DIAGNOSIS — M810 Age-related osteoporosis without current pathological fracture: Secondary | ICD-10-CM | POA: Diagnosis not present

## 2020-06-30 ENCOUNTER — Other Ambulatory Visit: Payer: Self-pay

## 2020-06-30 ENCOUNTER — Ambulatory Visit: Payer: Medicare Other | Admitting: Cardiology

## 2020-06-30 DIAGNOSIS — I359 Nonrheumatic aortic valve disorder, unspecified: Secondary | ICD-10-CM | POA: Insufficient documentation

## 2020-06-30 DIAGNOSIS — I4891 Unspecified atrial fibrillation: Secondary | ICD-10-CM | POA: Insufficient documentation

## 2020-07-01 ENCOUNTER — Ambulatory Visit (INDEPENDENT_AMBULATORY_CARE_PROVIDER_SITE_OTHER): Payer: Medicare Other | Admitting: Cardiology

## 2020-07-01 ENCOUNTER — Other Ambulatory Visit: Payer: Self-pay

## 2020-07-01 VITALS — BP 148/82 | HR 81 | Ht 60.0 in | Wt 138.0 lb

## 2020-07-01 DIAGNOSIS — Z950 Presence of cardiac pacemaker: Secondary | ICD-10-CM

## 2020-07-01 DIAGNOSIS — I10 Essential (primary) hypertension: Secondary | ICD-10-CM

## 2020-07-01 DIAGNOSIS — I4821 Permanent atrial fibrillation: Secondary | ICD-10-CM | POA: Diagnosis not present

## 2020-07-01 NOTE — Progress Notes (Signed)
Cardiology Office Note:    Date:  07/01/2020   ID:  [name], [name] 1931-05-05, MRN [id number]  PCP:  Nicoletta Dress, MD  Cardiologist:  Jenne Campus, MD    Referring MD: Nicoletta Dress, MD   Chief Complaint  Patient presents with  . Medication Problem    History of Present Illness:    [name] is a 87 y.o. female with complex past medical history.  That include dilated cardiomyopathy with normalization, however latest echocardiogram I see is from November of last year showing ejection fraction 45%, permanent atrial fibrillation, refusing anticoagulation, pacemaker present which is Medtronic device.  She also got congestive heart failure.  She comes today to my office after not being seen for almost 9 months.  She does have a lot of issues for several her legs are swollen and have some abrasions on her legs.  Described to have some chronic back pain which actually brought her to the hospital once.  It looks like work-up has been negative.  She said she got difficulty laying flat not because of shortness of breath but because of pain in her back.  She comes today to my office follow-up.  Swelling of lower extremities the same.  Her weight is identical.  She tells me she does not take diuretics every single day she said she hated the fact she have to go to the restroom she does have some also some incontinence which contributed to her unwillingness to continue taking medication on the regular basis.  She is trying to drink plenty of fluids however less than before.  She does not avoid salty food.  Denies have any palpitation no chest pain tightness squeezing pressure burning chest.  Past Medical History:  Diagnosis Date  . A-fib ([place])   . Anemia   . Aortic valve disorder   . Arthritis    osteoarthritis - back and knee  . Benign hypertensive heart disease without heart failure   . Blood transfusion   . Body mass index (BMI) 27.0-27.9, adult 01/01/2020  . Carpal  tunnel syndrome 02/19/2018  . Cataract    bilateral  . Chest pain at rest 08/18/2014  . Chronic atrial fibrillation ([place]) 07/12/2017  . Chronic back pain   . Chronic thoracic back pain 04/17/2013  . Congestive heart failure (CHF) ([place]) 06/03/2020  . Degeneration of lumbosacral intervertebral disc 03/31/2014  . Dilated cardiomyopathy ([place]) 09/17/2017  . Displacement of cervical intervertebral disc without myelopathy 10/22/2014  . Displacement of lumbar intervertebral disc without myelopathy 09/24/2012  . Diverticulosis   . Essential hypertension 10/06/2013  . GERD (gastroesophageal reflux disease)   . Hematoma 11/04/2013  . History of stomach ulcers   . Hyperlipidemia   . Hypertension   . Impingement syndrome of shoulder region 05/17/2014  . Lumbar foraminal stenosis 04/19/2020  . Neck pain 10/22/2014  . Opioid dependence ([place]) 04/19/2020  . Osteoarthrosis   . Other long term (current) drug therapy 04/19/2020  . Pacemaker   . Pacemaker reprogramming/check 08/18/2014   medtronic  Device implantedSeptember 2012 by Angie Fava of this note might be different from the original. Overview:  Overview:  Training and development officer 2012 by Agustin Cree  . Pain in upper limb 04/27/2015  . Paroxysmal atrial fibrillation ([place]) 08/18/2014   Formatting of this note might be different from the original. Not anticoagulated secondary to intracranial bleed while on coumadin  . Permanent atrial fibrillation ([place]) 02/11/2019  . Rotator cuff tear arthropathy of right shoulder  11/16/2015  . S/P shoulder replacement 11/16/2015  . Shoulder impingement 05/17/2014  . Spinal stenosis 03/31/2014  . Status post lumbar surgery   . Vitamin D deficiency     Past Surgical History:  Procedure Laterality Date  . ABDOMINAL HYSTERECTOMY    . APPENDECTOMY    . BACK SURGERY     five back surgeries  . cataract extaction -left    . cataract extraction right    . CHOLECYSTECTOMY    . COLONOSCOPY    . CRANIOTOMY     ?  subdural hematoma?  Sustained head injury after falling while taking Coumadin  . ESOPHAGOGASTRODUODENOSCOPY    . EYE SURGERY Bilateral    cataract surgery with lens implant  . FRACTURE SURGERY     left wrist, with plate and removal of plate  . INSERT / REPLACE / REMOVE PACEMAKER     Medtronic  . KNEE ARTHROPLASTY     right knee  . REVERSE SHOULDER ARTHROPLASTY Right 11/16/2015  . REVERSE SHOULDER ARTHROPLASTY Right 11/16/2015   Procedure: REVERSE SHOULDER ARTHROPLASTY;  Surgeon: Marchia Bond, MD;  Location: [place];  Service: Orthopedics;  Laterality: Right;  . S1 vertebroplasty    . SHOULDER OPEN ROTATOR CUFF REPAIR  right    Current Medications: Current Meds  Medication Sig  . ALPRAZolam (XANAX) 0.25 MG tablet Take 0.25 mg by mouth 3 (three) times daily as needed for anxiety.  Marland Kitchen aspirin EC 81 MG tablet Take 81 mg by mouth at bedtime.  . Calcium Carb-Cholecalciferol (CALCIUM-VITAMIN D) 500-200 MG-UNIT tablet Take 1 tablet 2 (two) times daily by mouth.  . Cholecalciferol (VITAMIN D3 PO) Take 1 tablet by mouth daily. 5000 units  . Coenzyme Q10 100 MG capsule Take 100 mg daily by mouth.   . denosumab (PROLIA) 60 MG/ML SOSY injection Inject 60 mg into the skin every 6 (six) months.  . diltiazem (CARDIZEM CD) 240 MG 24 hr capsule TAKE 1 CAPSULE BY MOUTH EVERY DAY (Patient taking differently: Take 240 mg by mouth daily.)  . famotidine (PEPCID) 40 MG tablet Take 40 mg by mouth daily.  . furosemide (LASIX) 40 MG tablet Take 1.5 tablets (60 mg total) by mouth daily.  Marland Kitchen GARLIC PO Take 1 tablet by mouth daily. Unknown strength  . ibuprofen (ADVIL) 800 MG tablet Take 800 mg by mouth every 8 (eight) hours as needed for mild pain or moderate pain.  . Multiple Vitamin (MULTIVITAMIN) tablet Take 1 tablet by mouth daily. Unknown strength  . oxyCODONE-acetaminophen (PERCOCET) 10-325 MG tablet Take 1 tablet by mouth every 6 (six) hours as needed for pain.  . pantoprazole (PROTONIX) 40 MG tablet Take 40  mg by mouth daily.     Allergies:   Amlodipine, Levofloxacin, and Penicillins   Social History   Socioeconomic History  . Marital status: Widowed    Spouse name: Not on file  . Number of children: 4  . Years of education: 58  . Highest education level: Not on file  Occupational History  . Occupation: retired  Tobacco Use  . Smoking status: Never Smoker  . Smokeless tobacco: Never Used  Vaping Use  . Vaping Use: Never used  Substance and Sexual Activity  . Alcohol use: No  . Drug use: No  . Sexual activity: Not on file  Other Topics Concern  . Not on file  Social History Narrative   Lives alone , One story   Right handed   She retired as a Regulatory affairs officer.    Social  Determinants of Health   Financial Resource Strain: Not on file  Food Insecurity: Not on file  Transportation Needs: Not on file  Physical Activity: Not on file  Stress: Not on file  Social Connections: Not on file     Family History: The patient's family history includes Alcoholism in her father; Diabetes in her brother, brother, brother, and mother; Throat cancer in her brother; Uterine cancer in her mother. ROS:   Please see the history of present illness.    All 14 point review of systems negative except as described per history of present illness  EKGs/Labs/Other Studies Reviewed:      Recent Labs: No results found for requested labs within last 8760 hours.  Recent Lipid Panel No results found for: CHOL, TRIG, HDL, CHOLHDL, VLDL, LDLCALC, LDLDIRECT  Physical Exam:    VS:  BP (!) 148/82 (BP Location: Left Arm, Patient Position: Sitting)   Pulse 81   Ht 5' (1.524 m)   Wt 138 lb (62.6 kg)   SpO2 95%   BMI 26.95 kg/m     Wt Readings from Last 3 Encounters:  07/01/20 138 lb (62.6 kg)  06/03/20 137 lb 3.2 oz (62.2 kg)  09/02/19 141 lb (64 kg)     GEN:  Well nourished, well developed in no acute distress HEENT: Normal NECK: No JVD; No carotid bruits LYMPHATICS: No  lymphadenopathy CARDIAC: Irregular irregular, no murmurs, no rubs, no gallops RESPIRATORY:  Clear to auscultation without rales, wheezing or rhonchi  ABDOMEN: Soft, non-tender, non-distended MUSCULOSKELETAL:  No edema; No deformity  SKIN: Warm and dry LOWER EXTREMITIES: 1+ swelling NEUROLOGIC:  Alert and oriented x 3 PSYCHIATRIC:  Normal affect   ASSESSMENT:    1. Permanent atrial fibrillation ([place])   2. Essential hypertension   3. Pacemaker    PLAN:    In order of problems listed above:  1. Congestive heart failure which appears to be decompensated and trying to put on the right medication but my feeling is that she is probably does not take the medication when she should we did review again the fact that she is to avoid excessive of fluid she need to avoid excessive salt I told her to keep taking 60 mg furosemide every single day and I asked her simply to stay home for about 5 days and take this medication on the regular basis and I promised her if she would keep doing this she will stop urinating that much.  Hopefully she will be able to do it and see what will be able accomplished I will call primary care physician to get her Chem-7 done as well as proBNP which apparently was done just few days ago. 2. Essential hypertension I suspect the reason for her blood pressure being still elevated is the fact she does not take medications when she should. 3. Pacemaker noted   Medication Adjustments/Labs and Tests Ordered: Current medicines are reviewed at length with the patient today.  Concerns regarding medicines are outlined above.  No orders of the defined types were placed in this encounter.  Medication changes: No orders of the defined types were placed in this encounter.   Signed, Park Liter, MD, [facility] 07/01/2020 4:17 PM    [facility]

## 2020-07-01 NOTE — Patient Instructions (Signed)
Medication Instructions:  No medication changes. *If you need a refill on your cardiac medications before your next appointment, please call your pharmacy*   Lab Work: None ordered If you have labs (blood work) drawn today and your tests are completely normal, you will receive your results only by: Marland Kitchen MyChart Message (if you have MyChart) OR . A paper copy in the mail If you have any lab test that is abnormal or we need to change your treatment, we will call you to review the results.   Testing/Procedures: None ordered   Follow-Up: At [facility], you and your health needs are our priority.  As part of our continuing mission to provide you with exceptional heart care, we have created designated Provider Care Teams.  These Care Teams include your primary Cardiologist (physician) and Advanced Practice Providers (APPs -  Physician Assistants and Nurse Practitioners) who all work together to provide you with the care you need, when you need it.  We recommend signing up for the patient portal called "MyChart".  Sign up information is provided on this After Visit Summary.  MyChart is used to connect with patients for Virtual Visits (Telemedicine).  Patients are able to view lab/test results, encounter notes, upcoming appointments, etc.  Non-urgent messages can be sent to your provider as well.   To learn more about what you can do with MyChart, go to [email or web address].    Your next appointment:   6 week(s)  The format for your next appointment:   In Person  Provider:   Jenne Campus, MD   Other Instructions NA

## 2020-07-23 ENCOUNTER — Other Ambulatory Visit: Payer: Self-pay | Admitting: Cardiology

## 2020-07-23 NOTE — Telephone Encounter (Signed)
Diltiazem 240 mg # 90 sent to pharmacy

## 2020-08-17 DIAGNOSIS — G8929 Other chronic pain: Secondary | ICD-10-CM | POA: Insufficient documentation

## 2020-08-17 DIAGNOSIS — I10 Essential (primary) hypertension: Secondary | ICD-10-CM | POA: Insufficient documentation

## 2020-08-19 ENCOUNTER — Encounter: Payer: Self-pay | Admitting: Cardiology

## 2020-08-19 ENCOUNTER — Ambulatory Visit (INDEPENDENT_AMBULATORY_CARE_PROVIDER_SITE_OTHER): Payer: Medicare Other | Admitting: Cardiology

## 2020-08-19 ENCOUNTER — Other Ambulatory Visit: Payer: Self-pay

## 2020-08-19 VITALS — BP 128/62 | HR 84 | Ht 60.0 in | Wt 132.0 lb

## 2020-08-19 DIAGNOSIS — I10 Essential (primary) hypertension: Secondary | ICD-10-CM | POA: Diagnosis not present

## 2020-08-19 DIAGNOSIS — I480 Paroxysmal atrial fibrillation: Secondary | ICD-10-CM | POA: Diagnosis not present

## 2020-08-19 DIAGNOSIS — Z950 Presence of cardiac pacemaker: Secondary | ICD-10-CM

## 2020-08-19 DIAGNOSIS — I420 Dilated cardiomyopathy: Secondary | ICD-10-CM | POA: Diagnosis not present

## 2020-08-19 DIAGNOSIS — E785 Hyperlipidemia, unspecified: Secondary | ICD-10-CM | POA: Diagnosis not present

## 2020-08-19 DIAGNOSIS — I5043 Acute on chronic combined systolic (congestive) and diastolic (congestive) heart failure: Secondary | ICD-10-CM | POA: Diagnosis not present

## 2020-08-19 DIAGNOSIS — Z1331 Encounter for screening for depression: Secondary | ICD-10-CM | POA: Diagnosis not present

## 2020-08-19 DIAGNOSIS — Z0000 Encounter for general adult medical examination without abnormal findings: Secondary | ICD-10-CM | POA: Diagnosis not present

## 2020-08-19 DIAGNOSIS — Z9181 History of falling: Secondary | ICD-10-CM | POA: Diagnosis not present

## 2020-08-19 NOTE — Progress Notes (Signed)
Cardiology Office Note:    Date:  08/19/2020   ID:  [name], [name] 1931-05-25, MRN [id number]  PCP:  Nicoletta Dress, MD  Cardiologist:  Jenne Campus, MD    Referring MD: Nicoletta Dress, MD   Chief Complaint  Patient presents with  . Follow-up    History of Present Illness:    [name] is a 87 y.o. female   with complex past medical history. That include dilated cardiomyopathy with normalization, however latest echocardiogram I see is from November of last year showing ejection fraction 45%, permanent atrial fibrillation, refusing anticoagulation, pacemaker present which is Medtronic device. She also got congestive heart failure.  She comes today to my office for follow-up.  Overall she is doing better swelling of lower extremities improved, still complain of having urinating a lot when she takes diuretic last time we had this discussion I strongly recommend to take that medication on the regular basis.  Primary care physician also gave her prescription for potassium.  We will check her kidney function potassium today.  Denies have any chest pain tightness squeezing pressure burning chest no palpitations no dizziness  Past Medical History:  Diagnosis Date  . A-fib ([place])   . Anemia   . Aortic valve disorder   . Arthritis    osteoarthritis - back and knee  . Benign hypertensive heart disease without heart failure   . Blood transfusion   . Body mass index (BMI) 27.0-27.9, adult 01/01/2020  . Carpal tunnel syndrome 02/19/2018  . Cataract    bilateral  . Chest pain at rest 08/18/2014  . Chronic atrial fibrillation ([place]) 07/12/2017  . Chronic back pain   . Chronic thoracic back pain 04/17/2013  . Congestive heart failure (CHF) ([place]) 06/03/2020  . Degeneration of lumbosacral intervertebral disc 03/31/2014  . Dilated cardiomyopathy ([place]) 09/17/2017  . Displacement of cervical intervertebral disc without myelopathy 10/22/2014  . Displacement of lumbar intervertebral  disc without myelopathy 09/24/2012  . Diverticulosis   . Essential hypertension 10/06/2013  . GERD (gastroesophageal reflux disease)   . Hematoma 11/04/2013  . History of stomach ulcers   . Hyperlipidemia   . Hypertension   . Impingement syndrome of shoulder region 05/17/2014  . Lumbar foraminal stenosis 04/19/2020  . Neck pain 10/22/2014  . Opioid dependence ([place]) 04/19/2020  . Osteoarthrosis   . Other long term (current) drug therapy 04/19/2020  . Pacemaker   . Pacemaker reprogramming/check 08/18/2014   medtronic  Device implantedSeptember 2012 by Angie Fava of this note might be different from the original. Overview:  Overview:  Training and development officer 2012 by Agustin Cree  . Pain in upper limb 04/27/2015  . Paroxysmal atrial fibrillation ([place]) 08/18/2014   Formatting of this note might be different from the original. Not anticoagulated secondary to intracranial bleed while on coumadin  . Permanent atrial fibrillation ([place]) 02/11/2019  . Rotator cuff tear arthropathy of right shoulder 11/16/2015  . S/P shoulder replacement 11/16/2015  . Shoulder impingement 05/17/2014  . Spinal stenosis 03/31/2014  . Status post lumbar surgery   . Vitamin D deficiency     Past Surgical History:  Procedure Laterality Date  . ABDOMINAL HYSTERECTOMY    . APPENDECTOMY    . BACK SURGERY     five back surgeries  . cataract extaction -left    . cataract extraction right    . CHOLECYSTECTOMY    . COLONOSCOPY    . CRANIOTOMY     ? subdural hematoma?  Sustained  head injury after falling while taking Coumadin  . ESOPHAGOGASTRODUODENOSCOPY    . EYE SURGERY Bilateral    cataract surgery with lens implant  . FRACTURE SURGERY     left wrist, with plate and removal of plate  . INSERT / REPLACE / REMOVE PACEMAKER     Medtronic  . KNEE ARTHROPLASTY     right knee  . REVERSE SHOULDER ARTHROPLASTY Right 11/16/2015  . REVERSE SHOULDER ARTHROPLASTY Right 11/16/2015   Procedure: REVERSE SHOULDER  ARTHROPLASTY;  Surgeon: Marchia Bond, MD;  Location: [facility];  Service: Orthopedics;  Laterality: Right;  . S1 vertebroplasty    . SHOULDER OPEN ROTATOR CUFF REPAIR  right    Current Medications: Current Meds  Medication Sig  . ALPRAZolam (XANAX) 0.25 MG tablet Take 0.25 mg by mouth 3 (three) times daily as needed for anxiety.  Marland Kitchen aspirin EC 81 MG tablet Take 81 mg by mouth at bedtime.  . Cholecalciferol (VITAMIN D3 PO) Take 1 tablet by mouth daily. 5000 units  . denosumab (PROLIA) 60 MG/ML SOSY injection Inject 60 mg into the skin every 6 (six) months.  . diltiazem (CARDIZEM CD) 240 MG 24 hr capsule TAKE 1 CAPSULE BY MOUTH EVERY DAY (Patient taking differently: Take 240 mg by mouth daily.)  . famotidine (PEPCID) 40 MG tablet Take 40 mg by mouth daily.  . furosemide (LASIX) 40 MG tablet Take 1.5 tablets (60 mg total) by mouth daily.  Marland Kitchen ibuprofen (ADVIL) 800 MG tablet Take 800 mg by mouth every 8 (eight) hours as needed for mild pain or moderate pain.  . Multiple Vitamin (MULTIVITAMIN) tablet Take 1 tablet by mouth daily. Unknown strength  . oxyCODONE-acetaminophen (PERCOCET) 10-325 MG tablet Take 1 tablet by mouth every 6 (six) hours as needed for pain.  . pantoprazole (PROTONIX) 40 MG tablet Take 40 mg by mouth daily.  Marland Kitchen POTASSIUM CHLORIDE ER PO Take 1 tablet by mouth daily. Unknown strength     Allergies:   Amlodipine, Levofloxacin, and Penicillins   Social History   Socioeconomic History  . Marital status: Widowed    Spouse name: Not on file  . Number of children: 4  . Years of education: 72  . Highest education level: Not on file  Occupational History  . Occupation: retired  Tobacco Use  . Smoking status: Never Smoker  . Smokeless tobacco: Never Used  Vaping Use  . Vaping Use: Never used  Substance and Sexual Activity  . Alcohol use: No  . Drug use: No  . Sexual activity: Not on file  Other Topics Concern  . Not on file  Social History Narrative   Lives alone , One  story   Right handed   She retired as a Regulatory affairs officer.    Social Determinants of Health   Financial Resource Strain: Not on file  Food Insecurity: Not on file  Transportation Needs: Not on file  Physical Activity: Not on file  Stress: Not on file  Social Connections: Not on file     Family History: The patient's family history includes Alcoholism in her father; Diabetes in her brother, brother, brother, and mother; Throat cancer in her brother; Uterine cancer in her mother. ROS:   Please see the history of present illness.    All 14 point review of systems negative except as described per history of present illness  EKGs/Labs/Other Studies Reviewed:      Recent Labs: No results found for requested labs within last 8760 hours.  Recent Lipid Panel No results  found for: CHOL, TRIG, HDL, CHOLHDL, VLDL, LDLCALC, LDLDIRECT  Physical Exam:    VS:  BP 128/62 (BP Location: Left Arm, Patient Position: Sitting)   Pulse 84   Ht 5' (1.524 m)   Wt 132 lb (59.9 kg)   SpO2 95%   BMI 25.78 kg/m     Wt Readings from Last 3 Encounters:  08/19/20 132 lb (59.9 kg)  07/01/20 138 lb (62.6 kg)  06/03/20 137 lb 3.2 oz (62.2 kg)     GEN:  Well nourished, well developed in no acute distress HEENT: Normal NECK: No JVD; No carotid bruits LYMPHATICS: No lymphadenopathy CARDIAC: RRR, no murmurs, no rubs, no gallops RESPIRATORY:  Clear to auscultation without rales, wheezing or rhonchi  ABDOMEN: Soft, non-tender, non-distended MUSCULOSKELETAL:  No edema; No deformity  SKIN: Warm and dry LOWER EXTREMITIES: no swelling NEUROLOGIC:  Alert and oriented x 3 PSYCHIATRIC:  Normal affect   ASSESSMENT:    1. Paroxysmal atrial fibrillation (HCC)   2. Essential hypertension   3. Dilated cardiomyopathy ([place])   4. Acute on chronic combined systolic and diastolic congestive heart failure ([place])   5. Pacemaker    PLAN:    In order of problems listed above:  1. Permanent atrial fibrillation.  Rate  controlled.  She is refusing anticoagulation and she understand what of the risk of it I had multiple discussion with her regarding that issue still she refuses that medical 2. Essential hypertension, blood pressure seems to be well controlled continue present management. 3. Dilated cardiomyopathy.  I am trying to convince her to take medication on the regular basis she announced today to me that she is going to go to [place] for 3 to 4 days and she will not take her medication on the regular basis I was able to convince her to take half tablet of diuretic in the morning and half tablet of diuretic in the afternoon. 4. Congestive heart failure seems to be compensated with now minimal swelling of lower extremities.  Plan as described above. 5. Pacemaker present I did review interrogation normal function continue present management.   Medication Adjustments/Labs and Tests Ordered: Current medicines are reviewed at length with the patient today.  Concerns regarding medicines are outlined above.  No orders of the defined types were placed in this encounter.  Medication changes: No orders of the defined types were placed in this encounter.   Signed, Park Liter, MD, [facility] 08/19/2020 2:01 PM    [facility]

## 2020-08-19 NOTE — Patient Instructions (Signed)

## 2020-08-19 NOTE — Addendum Note (Signed)
Addended by: Senaida Ores on: 08/19/2020 02:07 PM   Modules accepted: Orders

## 2020-08-20 LAB — BASIC METABOLIC PANEL
BUN/Creatinine Ratio: 29 — ABNORMAL HIGH (ref 12–28)
BUN: 30 mg/dL — ABNORMAL HIGH (ref 8–27)
CO2: 19 mmol/L — ABNORMAL LOW (ref 20–29)
Calcium: 9.2 mg/dL (ref 8.7–10.3)
Chloride: 108 mmol/L — ABNORMAL HIGH (ref 96–106)
Creatinine, Ser: 1.03 mg/dL — ABNORMAL HIGH (ref 0.57–1.00)
Glucose: 105 mg/dL — ABNORMAL HIGH (ref 65–99)
Potassium: 5.6 mmol/L — ABNORMAL HIGH (ref 3.5–5.2)
Sodium: 142 mmol/L (ref 134–144)
eGFR: 53 mL/min/{1.73_m2} — ABNORMAL LOW (ref >59)

## 2020-08-23 ENCOUNTER — Telehealth: Payer: Self-pay | Admitting: Cardiology

## 2020-08-23 DIAGNOSIS — S81802A Unspecified open wound, left lower leg, initial encounter: Secondary | ICD-10-CM | POA: Diagnosis not present

## 2020-08-23 DIAGNOSIS — Z6825 Body mass index (BMI) 25.0-25.9, adult: Secondary | ICD-10-CM | POA: Diagnosis not present

## 2020-08-23 DIAGNOSIS — I10 Essential (primary) hypertension: Secondary | ICD-10-CM | POA: Diagnosis not present

## 2020-08-23 DIAGNOSIS — D638 Anemia in other chronic diseases classified elsewhere: Secondary | ICD-10-CM | POA: Diagnosis not present

## 2020-08-23 DIAGNOSIS — E875 Hyperkalemia: Secondary | ICD-10-CM

## 2020-08-23 DIAGNOSIS — I480 Paroxysmal atrial fibrillation: Secondary | ICD-10-CM | POA: Diagnosis not present

## 2020-08-23 DIAGNOSIS — E785 Hyperlipidemia, unspecified: Secondary | ICD-10-CM | POA: Diagnosis not present

## 2020-08-23 NOTE — Telephone Encounter (Signed)
[name] is returning [name]'s call in regards to her lab results. Please advise.

## 2020-08-24 NOTE — Telephone Encounter (Signed)
-----   Message from Park Liter, MD sent at 08/23/2020 10:23 AM EDT ----- Ask her to stop taking potassium, potassium is elevated 5.6, she is to be brought back to our office probably on Thursday to have Chem-7 rechecked Chem-7

## 2020-08-24 NOTE — Telephone Encounter (Signed)
Patient informed of results. She will stop potassium and repeat labs on Thursday no further questions.

## 2020-08-26 DIAGNOSIS — E875 Hyperkalemia: Secondary | ICD-10-CM | POA: Diagnosis not present

## 2020-08-26 LAB — BASIC METABOLIC PANEL
BUN/Creatinine Ratio: 25 (ref 12–28)
BUN: 28 mg/dL — ABNORMAL HIGH (ref 8–27)
CO2: 18 mmol/L — ABNORMAL LOW (ref 20–29)
Calcium: 9.2 mg/dL (ref 8.7–10.3)
Chloride: 106 mmol/L (ref 96–106)
Creatinine, Ser: 1.12 mg/dL — ABNORMAL HIGH (ref 0.57–1.00)
Glucose: 95 mg/dL (ref 65–99)
Potassium: 5.3 mmol/L — ABNORMAL HIGH (ref 3.5–5.2)
Sodium: 139 mmol/L (ref 134–144)
eGFR: 48 mL/min/{1.73_m2} — ABNORMAL LOW (ref >59)

## 2020-08-27 DIAGNOSIS — S81802A Unspecified open wound, left lower leg, initial encounter: Secondary | ICD-10-CM | POA: Diagnosis not present

## 2020-08-30 ENCOUNTER — Telehealth: Payer: Self-pay | Admitting: Emergency Medicine

## 2020-08-30 DIAGNOSIS — Z79899 Other long term (current) drug therapy: Secondary | ICD-10-CM

## 2020-08-30 NOTE — Telephone Encounter (Signed)
Patient informed of results. She will have labs rechecked in 2 weeks no further questions.

## 2020-08-30 NOTE — Telephone Encounter (Signed)
-----   Message from Park Liter, MD sent at 08/27/2020  1:03 PM EDT ----- Labs look good, continue present management, potassium improved kidney function slightly worse but still acceptable.  Lets recheck Chem-7 in about 2 weeks

## 2020-08-31 DIAGNOSIS — M542 Cervicalgia: Secondary | ICD-10-CM | POA: Diagnosis not present

## 2020-09-07 DIAGNOSIS — S81802A Unspecified open wound, left lower leg, initial encounter: Secondary | ICD-10-CM | POA: Diagnosis not present

## 2020-09-07 DIAGNOSIS — M1990 Unspecified osteoarthritis, unspecified site: Secondary | ICD-10-CM | POA: Diagnosis not present

## 2020-09-13 ENCOUNTER — Ambulatory Visit (INDEPENDENT_AMBULATORY_CARE_PROVIDER_SITE_OTHER): Payer: Medicare Other

## 2020-09-13 DIAGNOSIS — I420 Dilated cardiomyopathy: Secondary | ICD-10-CM | POA: Diagnosis not present

## 2020-09-14 LAB — CUP PACEART REMOTE DEVICE CHECK
Battery Impedance: 3010 Ohm
Battery Remaining Longevity: 28 mo
Battery Voltage: 2.73 V
Brady Statistic RV Percent Paced: 9 %
Date Time Interrogation Session: 20220628141634
Implantable Lead Implant Date: 20120911
Implantable Lead Implant Date: 20120911
Implantable Lead Location: 753859
Implantable Lead Location: 753860
Implantable Lead Model: 4092
Implantable Lead Model: 5076
Implantable Pulse Generator Implant Date: 20120911
Lead Channel Impedance Value: 606 Ohm
Lead Channel Impedance Value: 67 Ohm
Lead Channel Pacing Threshold Amplitude: 0.5 V
Lead Channel Pacing Threshold Pulse Width: 0.4 ms
Lead Channel Setting Pacing Amplitude: 2 V
Lead Channel Setting Pacing Pulse Width: 0.4 ms
Lead Channel Setting Sensing Sensitivity: 5.6 mV

## 2020-09-21 ENCOUNTER — Other Ambulatory Visit: Payer: Self-pay | Admitting: Cardiology

## 2020-09-21 DIAGNOSIS — S81802A Unspecified open wound, left lower leg, initial encounter: Secondary | ICD-10-CM | POA: Diagnosis not present

## 2020-10-04 NOTE — Progress Notes (Signed)
Remote pacemaker transmission.   

## 2020-10-05 DIAGNOSIS — S81802A Unspecified open wound, left lower leg, initial encounter: Secondary | ICD-10-CM | POA: Diagnosis not present

## 2020-10-12 DIAGNOSIS — D485 Neoplasm of uncertain behavior of skin: Secondary | ICD-10-CM | POA: Diagnosis not present

## 2020-10-21 DIAGNOSIS — S81802A Unspecified open wound, left lower leg, initial encounter: Secondary | ICD-10-CM | POA: Diagnosis not present

## 2020-11-02 DIAGNOSIS — C44629 Squamous cell carcinoma of skin of left upper limb, including shoulder: Secondary | ICD-10-CM | POA: Diagnosis not present

## 2020-11-02 HISTORY — PX: OTHER SURGICAL HISTORY: SHX169

## 2020-11-09 DIAGNOSIS — L905 Scar conditions and fibrosis of skin: Secondary | ICD-10-CM | POA: Diagnosis not present

## 2020-11-09 DIAGNOSIS — L308 Other specified dermatitis: Secondary | ICD-10-CM | POA: Diagnosis not present

## 2020-11-10 DIAGNOSIS — T8131XA Disruption of external operation (surgical) wound, not elsewhere classified, initial encounter: Secondary | ICD-10-CM | POA: Diagnosis not present

## 2020-11-10 DIAGNOSIS — L0292 Furuncle, unspecified: Secondary | ICD-10-CM | POA: Diagnosis not present

## 2020-11-11 DIAGNOSIS — L02414 Cutaneous abscess of left upper limb: Secondary | ICD-10-CM | POA: Diagnosis not present

## 2020-11-18 DIAGNOSIS — L0100 Impetigo, unspecified: Secondary | ICD-10-CM | POA: Diagnosis not present

## 2020-11-23 DIAGNOSIS — D539 Nutritional anemia, unspecified: Secondary | ICD-10-CM | POA: Diagnosis not present

## 2020-11-23 DIAGNOSIS — D638 Anemia in other chronic diseases classified elsewhere: Secondary | ICD-10-CM | POA: Diagnosis not present

## 2020-11-23 DIAGNOSIS — Z139 Encounter for screening, unspecified: Secondary | ICD-10-CM | POA: Diagnosis not present

## 2020-11-23 DIAGNOSIS — I480 Paroxysmal atrial fibrillation: Secondary | ICD-10-CM | POA: Diagnosis not present

## 2020-11-23 DIAGNOSIS — E785 Hyperlipidemia, unspecified: Secondary | ICD-10-CM | POA: Diagnosis not present

## 2020-11-23 DIAGNOSIS — I10 Essential (primary) hypertension: Secondary | ICD-10-CM | POA: Diagnosis not present

## 2020-12-01 DIAGNOSIS — E875 Hyperkalemia: Secondary | ICD-10-CM | POA: Diagnosis not present

## 2020-12-03 ENCOUNTER — Other Ambulatory Visit: Payer: Self-pay | Admitting: Cardiology

## 2020-12-15 ENCOUNTER — Other Ambulatory Visit: Payer: Self-pay

## 2020-12-15 ENCOUNTER — Ambulatory Visit (INDEPENDENT_AMBULATORY_CARE_PROVIDER_SITE_OTHER): Payer: Medicare Other | Admitting: Cardiology

## 2020-12-15 ENCOUNTER — Encounter: Payer: Self-pay | Admitting: Cardiology

## 2020-12-15 VITALS — BP 162/82 | HR 102 | Ht 60.0 in | Wt 139.2 lb

## 2020-12-15 DIAGNOSIS — E782 Mixed hyperlipidemia: Secondary | ICD-10-CM

## 2020-12-15 DIAGNOSIS — I359 Nonrheumatic aortic valve disorder, unspecified: Secondary | ICD-10-CM

## 2020-12-15 DIAGNOSIS — I482 Chronic atrial fibrillation, unspecified: Secondary | ICD-10-CM | POA: Diagnosis not present

## 2020-12-15 DIAGNOSIS — I420 Dilated cardiomyopathy: Secondary | ICD-10-CM

## 2020-12-15 DIAGNOSIS — I10 Essential (primary) hypertension: Secondary | ICD-10-CM

## 2020-12-15 DIAGNOSIS — L0889 Other specified local infections of the skin and subcutaneous tissue: Secondary | ICD-10-CM | POA: Diagnosis not present

## 2020-12-15 NOTE — Patient Instructions (Signed)

## 2020-12-15 NOTE — Progress Notes (Signed)
Cardiology Office Note:    Date:  12/15/2020   ID:  Kaitlyn, [name] 11-Mar-1932, MRN [id number]  PCP:  Nicoletta Dress, MD  Cardiologist:  Jenne Campus, MD    Referring MD: Nicoletta Dress, MD   Chief Complaint  Patient presents with   Follow-up    History of Present Illness:    [name] is a 87 y.o. female complex past medical history she does have cardiomyopathy with normalization however latest echocardiogram done in November last year showed ejection fraction 45%.  She also have permanent atrial fibrillation refusing anticoagulation, pacemaker present which is a Medtronic device also does have congestive heart failure.  She comes today to my for follow-up she did not feel well for a long time eventually she was find to have strep infection of the skin that was treated with antibiotic however she got sick with antibiotic she was throwing up.  Now she is recovering from it and doing better.  Denies have any palpitation tightness squeezing pressure burning chest  Past Medical History:  Diagnosis Date   A-fib ([place])    Anemia    Aortic valve disorder    Arthritis    osteoarthritis - back and knee   Benign hypertensive heart disease without heart failure    Blood transfusion    Body mass index (BMI) 27.0-27.9, adult 01/01/2020   Carpal tunnel syndrome 02/19/2018   Cataract    bilateral   Chest pain at rest 08/18/2014   Chronic atrial fibrillation ([place]) 07/12/2017   Chronic back pain    Chronic thoracic back pain 04/17/2013   Congestive heart failure (CHF) ([place]) 06/03/2020   Degeneration of lumbosacral intervertebral disc 03/31/2014   Dilated cardiomyopathy ([place]) 09/17/2017   Displacement of cervical intervertebral disc without myelopathy 10/22/2014   Displacement of lumbar intervertebral disc without myelopathy 09/24/2012   Diverticulosis    Essential hypertension 10/06/2013   GERD (gastroesophageal reflux disease)    Hematoma 11/04/2013   History of stomach ulcers     Hyperlipidemia    Hypertension    Impingement syndrome of shoulder region 05/17/2014   Lumbar foraminal stenosis 04/19/2020   Neck pain 10/22/2014   Opioid dependence ([place]) 04/19/2020   Osteoarthrosis    Other long term (current) drug therapy 04/19/2020   Pacemaker    Pacemaker reprogramming/check 08/18/2014   medtronic  Device implantedSeptember 2012 by Angie Fava of this note might be different from the original. Overview:  Overview:  medtronic  Device implantedSeptember 2012 by Agustin Cree   Pain in upper limb 04/27/2015   Paroxysmal atrial fibrillation ([place]) 08/18/2014   Formatting of this note might be different from the original. Not anticoagulated secondary to intracranial bleed while on coumadin   Permanent atrial fibrillation ([place]) 02/11/2019   Rotator cuff tear arthropathy of right shoulder 11/16/2015   S/P shoulder replacement 11/16/2015   Shoulder impingement 05/17/2014   Spinal stenosis 03/31/2014   Status post lumbar surgery    Vitamin D deficiency     Past Surgical History:  Procedure Laterality Date   ABDOMINAL HYSTERECTOMY     APPENDECTOMY     BACK SURGERY     five back surgeries   cataract extaction -left     cataract extraction right     CHOLECYSTECTOMY     COLONOSCOPY     CRANIOTOMY     ? subdural hematoma?  Sustained head injury after falling while taking Coumadin   ESOPHAGOGASTRODUODENOSCOPY     EYE SURGERY Bilateral  cataract surgery with lens implant   FRACTURE SURGERY     left wrist, with plate and removal of plate   INSERT / REPLACE / REMOVE PACEMAKER     Medtronic   KNEE ARTHROPLASTY     right knee   REVERSE SHOULDER ARTHROPLASTY Right 11/16/2015   REVERSE SHOULDER ARTHROPLASTY Right 11/16/2015   Procedure: REVERSE SHOULDER ARTHROPLASTY;  Surgeon: Marchia Bond, MD;  Location: [facility];  Service: Orthopedics;  Laterality: Right;   S1 vertebroplasty     SHOULDER OPEN ROTATOR CUFF REPAIR  right   Skin cancer removal from L arm  11/02/2020     Current Medications: Current Meds  Medication Sig   ALPRAZolam (XANAX) 0.25 MG tablet Take 0.25 mg by mouth 3 (three) times daily as needed for anxiety.   aspirin EC 81 MG tablet Take 81 mg by mouth at bedtime.   Cholecalciferol (VITAMIN D3 PO) Take 1 tablet by mouth daily. 5000 units   denosumab (PROLIA) 60 MG/ML SOSY injection Inject 60 mg into the skin every 6 (six) months.   diltiazem (CARDIZEM CD) 240 MG 24 hr capsule TAKE 1 CAPSULE BY MOUTH EVERY DAY (Patient taking differently: Take 240 mg by mouth daily.)   famotidine (PEPCID) 40 MG tablet Take 40 mg by mouth daily.   furosemide (LASIX) 40 MG tablet Take 1.5 tablets (60 mg total) by mouth daily.   ibuprofen (ADVIL) 800 MG tablet Take 800 mg by mouth every 8 (eight) hours as needed for mild pain or moderate pain.   Multiple Vitamin (MULTIVITAMIN) tablet Take 1 tablet by mouth daily. Unknown strength   oxyCODONE-acetaminophen (PERCOCET) 10-325 MG tablet Take 1 tablet by mouth every 6 (six) hours as needed for pain.   pantoprazole (PROTONIX) 40 MG tablet Take 40 mg by mouth daily.     Allergies:   Amlodipine, Levofloxacin, and Penicillins   Social History   Socioeconomic History   Marital status: Widowed    Spouse name: Not on file   Number of children: 4   Years of education: 29   Highest education level: Not on file  Occupational History   Occupation: retired  Tobacco Use   Smoking status: Never   Smokeless tobacco: Never  Vaping Use   Vaping Use: Never used  Substance and Sexual Activity   Alcohol use: No   Drug use: No   Sexual activity: Not on file  Other Topics Concern   Not on file  Social History Narrative   Lives alone , One story   Right handed   She retired as a Regulatory affairs officer.    Social Determinants of Health   Financial Resource Strain: Not on file  Food Insecurity: Not on file  Transportation Needs: Not on file  Physical Activity: Not on file  Stress: Not on file  Social Connections: Not on file      Family History: The patient's family history includes Alcoholism in her father; Diabetes in her brother, brother, brother, and mother; Throat cancer in her brother; Uterine cancer in her mother. ROS:   Please see the history of present illness.    All 14 point review of systems negative except as described per history of present illness  EKGs/Labs/Other Studies Reviewed:      Recent Labs: 08/26/2020: BUN 28; Creatinine, Ser 1.12; Potassium 5.3; Sodium 139  Recent Lipid Panel No results found for: CHOL, TRIG, HDL, CHOLHDL, VLDL, LDLCALC, LDLDIRECT  Physical Exam:    VS:  BP (!) 162/82 (BP Location: Right Arm, Patient Position:  Sitting)   Pulse (!) 102   Ht 5' (1.524 m)   Wt 139 lb 3.2 oz (63.1 kg)   SpO2 99%   BMI 27.19 kg/m     Wt Readings from Last 3 Encounters:  12/15/20 139 lb 3.2 oz (63.1 kg)  08/19/20 132 lb (59.9 kg)  07/01/20 138 lb (62.6 kg)     GEN:  Well nourished, well developed in no acute distress HEENT: Normal NECK: No JVD; No carotid bruits LYMPHATICS: No lymphadenopathy CARDIAC: RRR, no murmurs, no rubs, no gallops RESPIRATORY:  Clear to auscultation without rales, wheezing or rhonchi  ABDOMEN: Soft, non-tender, non-distended MUSCULOSKELETAL:  No edema; No deformity  SKIN: Warm and dry LOWER EXTREMITIES: no swelling NEUROLOGIC:  Alert and oriented x 3 PSYCHIATRIC:  Normal affect   ASSESSMENT:    1. Dilated cardiomyopathy ([place])   2. Chronic atrial fibrillation (HCC)   3. Essential hypertension   4. Aortic valve disorder   5. Mixed hyperlipidemia    PLAN:    In order of problems listed above:  Dilated cardiomyopathy she says she is feeling fine she does not want to take anymore medications.  She said she is happy the way she is. Permanent atrial fibrillation does not want to take on an anticoagulation.  She is taking only aspirin.  Likely rate appears to be controlled. Essential hypertension blood pressure elevated today however she did not  take her diuretics today and is why her blood pressure is elevated. Pacemaker present, I did review interrogation which was done in summer of this year 22 months left in the device.  Overall health admits that she is difficult patient does a lot of things I would like to do with her however she does not want to do that luckily overall clinically she seems to be good   Medication Adjustments/Labs and Tests Ordered: Current medicines are reviewed at length with the patient today.  Concerns regarding medicines are outlined above.  No orders of the defined types were placed in this encounter.  Medication changes: No orders of the defined types were placed in this encounter.   Signed, Park Liter, MD, [facility] 12/15/2020 3:38 PM    [place]

## 2020-12-16 DIAGNOSIS — Z23 Encounter for immunization: Secondary | ICD-10-CM | POA: Diagnosis not present

## 2021-01-10 DIAGNOSIS — L02413 Cutaneous abscess of right upper limb: Secondary | ICD-10-CM | POA: Diagnosis not present

## 2021-01-10 DIAGNOSIS — L309 Dermatitis, unspecified: Secondary | ICD-10-CM | POA: Diagnosis not present

## 2021-01-11 DIAGNOSIS — M79601 Pain in right arm: Secondary | ICD-10-CM | POA: Diagnosis not present

## 2021-01-31 ENCOUNTER — Other Ambulatory Visit: Payer: Self-pay | Admitting: Cardiology

## 2021-01-31 NOTE — Telephone Encounter (Signed)
RX sent

## 2021-02-22 DIAGNOSIS — I10 Essential (primary) hypertension: Secondary | ICD-10-CM | POA: Diagnosis not present

## 2021-02-22 DIAGNOSIS — I480 Paroxysmal atrial fibrillation: Secondary | ICD-10-CM | POA: Diagnosis not present

## 2021-02-22 DIAGNOSIS — E785 Hyperlipidemia, unspecified: Secondary | ICD-10-CM | POA: Diagnosis not present

## 2021-02-22 DIAGNOSIS — D638 Anemia in other chronic diseases classified elsewhere: Secondary | ICD-10-CM | POA: Diagnosis not present

## 2021-02-28 DIAGNOSIS — Z23 Encounter for immunization: Secondary | ICD-10-CM | POA: Diagnosis not present

## 2021-03-10 DIAGNOSIS — L03114 Cellulitis of left upper limb: Secondary | ICD-10-CM | POA: Diagnosis not present

## 2021-03-11 DIAGNOSIS — Z880 Allergy status to penicillin: Secondary | ICD-10-CM | POA: Diagnosis not present

## 2021-03-11 DIAGNOSIS — M7989 Other specified soft tissue disorders: Secondary | ICD-10-CM | POA: Diagnosis not present

## 2021-03-11 DIAGNOSIS — N183 Chronic kidney disease, stage 3 unspecified: Secondary | ICD-10-CM | POA: Diagnosis not present

## 2021-03-11 DIAGNOSIS — I129 Hypertensive chronic kidney disease with stage 1 through stage 4 chronic kidney disease, or unspecified chronic kidney disease: Secondary | ICD-10-CM | POA: Diagnosis not present

## 2021-03-11 DIAGNOSIS — Z7982 Long term (current) use of aspirin: Secondary | ICD-10-CM | POA: Diagnosis not present

## 2021-03-11 DIAGNOSIS — I4891 Unspecified atrial fibrillation: Secondary | ICD-10-CM | POA: Diagnosis not present

## 2021-03-11 DIAGNOSIS — Z881 Allergy status to other antibiotic agents status: Secondary | ICD-10-CM | POA: Diagnosis not present

## 2021-03-11 DIAGNOSIS — L03114 Cellulitis of left upper limb: Secondary | ICD-10-CM | POA: Diagnosis not present

## 2021-03-11 DIAGNOSIS — D72829 Elevated white blood cell count, unspecified: Secondary | ICD-10-CM | POA: Diagnosis not present

## 2021-03-11 DIAGNOSIS — Z950 Presence of cardiac pacemaker: Secondary | ICD-10-CM | POA: Diagnosis not present

## 2021-03-11 DIAGNOSIS — L03119 Cellulitis of unspecified part of limb: Secondary | ICD-10-CM | POA: Diagnosis not present

## 2021-03-11 DIAGNOSIS — M1990 Unspecified osteoarthritis, unspecified site: Secondary | ICD-10-CM | POA: Diagnosis not present

## 2021-03-11 DIAGNOSIS — R600 Localized edema: Secondary | ICD-10-CM | POA: Diagnosis not present

## 2021-03-11 DIAGNOSIS — E8729 Other acidosis: Secondary | ICD-10-CM | POA: Diagnosis not present

## 2021-03-11 DIAGNOSIS — B9561 Methicillin susceptible Staphylococcus aureus infection as the cause of diseases classified elsewhere: Secondary | ICD-10-CM | POA: Diagnosis not present

## 2021-03-11 DIAGNOSIS — J9811 Atelectasis: Secondary | ICD-10-CM | POA: Diagnosis not present

## 2021-03-11 DIAGNOSIS — N189 Chronic kidney disease, unspecified: Secondary | ICD-10-CM | POA: Diagnosis not present

## 2021-03-11 DIAGNOSIS — M79642 Pain in left hand: Secondary | ICD-10-CM | POA: Diagnosis not present

## 2021-03-11 DIAGNOSIS — Z79899 Other long term (current) drug therapy: Secondary | ICD-10-CM | POA: Diagnosis not present

## 2021-03-11 DIAGNOSIS — I10 Essential (primary) hypertension: Secondary | ICD-10-CM | POA: Diagnosis not present

## 2021-03-11 DIAGNOSIS — L02512 Cutaneous abscess of left hand: Secondary | ICD-10-CM | POA: Diagnosis not present

## 2021-03-11 DIAGNOSIS — Z888 Allergy status to other drugs, medicaments and biological substances status: Secondary | ICD-10-CM | POA: Diagnosis not present

## 2021-03-11 DIAGNOSIS — M79602 Pain in left arm: Secondary | ICD-10-CM | POA: Diagnosis not present

## 2021-03-12 DIAGNOSIS — L03119 Cellulitis of unspecified part of limb: Secondary | ICD-10-CM | POA: Diagnosis not present

## 2021-03-12 DIAGNOSIS — L02512 Cutaneous abscess of left hand: Secondary | ICD-10-CM | POA: Diagnosis not present

## 2021-03-12 DIAGNOSIS — E8729 Other acidosis: Secondary | ICD-10-CM | POA: Diagnosis not present

## 2021-03-12 DIAGNOSIS — L03114 Cellulitis of left upper limb: Secondary | ICD-10-CM | POA: Diagnosis not present

## 2021-03-12 DIAGNOSIS — I10 Essential (primary) hypertension: Secondary | ICD-10-CM | POA: Diagnosis not present

## 2021-03-12 DIAGNOSIS — Z950 Presence of cardiac pacemaker: Secondary | ICD-10-CM | POA: Diagnosis not present

## 2021-03-13 DIAGNOSIS — L03114 Cellulitis of left upper limb: Secondary | ICD-10-CM | POA: Diagnosis not present

## 2021-03-13 DIAGNOSIS — E8729 Other acidosis: Secondary | ICD-10-CM | POA: Diagnosis not present

## 2021-03-13 DIAGNOSIS — L02512 Cutaneous abscess of left hand: Secondary | ICD-10-CM | POA: Diagnosis not present

## 2021-03-14 DIAGNOSIS — L03114 Cellulitis of left upper limb: Secondary | ICD-10-CM | POA: Diagnosis not present

## 2021-03-14 DIAGNOSIS — E8729 Other acidosis: Secondary | ICD-10-CM | POA: Diagnosis not present

## 2021-03-14 DIAGNOSIS — L02512 Cutaneous abscess of left hand: Secondary | ICD-10-CM | POA: Diagnosis not present

## 2021-03-15 DIAGNOSIS — E8729 Other acidosis: Secondary | ICD-10-CM | POA: Diagnosis not present

## 2021-03-15 DIAGNOSIS — L03114 Cellulitis of left upper limb: Secondary | ICD-10-CM | POA: Diagnosis not present

## 2021-03-15 DIAGNOSIS — L02512 Cutaneous abscess of left hand: Secondary | ICD-10-CM | POA: Diagnosis not present

## 2021-03-16 DIAGNOSIS — L02512 Cutaneous abscess of left hand: Secondary | ICD-10-CM | POA: Diagnosis not present

## 2021-03-16 DIAGNOSIS — L03114 Cellulitis of left upper limb: Secondary | ICD-10-CM | POA: Diagnosis not present

## 2021-03-16 DIAGNOSIS — E8729 Other acidosis: Secondary | ICD-10-CM | POA: Diagnosis not present

## 2021-03-17 DIAGNOSIS — E8729 Other acidosis: Secondary | ICD-10-CM | POA: Diagnosis not present

## 2021-03-17 DIAGNOSIS — L02512 Cutaneous abscess of left hand: Secondary | ICD-10-CM | POA: Diagnosis not present

## 2021-03-17 DIAGNOSIS — L03114 Cellulitis of left upper limb: Secondary | ICD-10-CM | POA: Diagnosis not present

## 2021-03-18 DIAGNOSIS — L02512 Cutaneous abscess of left hand: Secondary | ICD-10-CM | POA: Diagnosis not present

## 2021-03-18 DIAGNOSIS — L03114 Cellulitis of left upper limb: Secondary | ICD-10-CM | POA: Diagnosis not present

## 2021-03-18 DIAGNOSIS — E8729 Other acidosis: Secondary | ICD-10-CM | POA: Diagnosis not present

## 2021-03-21 DIAGNOSIS — N183 Chronic kidney disease, stage 3 unspecified: Secondary | ICD-10-CM | POA: Diagnosis not present

## 2021-03-21 DIAGNOSIS — L03114 Cellulitis of left upper limb: Secondary | ICD-10-CM | POA: Diagnosis not present

## 2021-03-21 DIAGNOSIS — Z950 Presence of cardiac pacemaker: Secondary | ICD-10-CM | POA: Diagnosis not present

## 2021-03-21 DIAGNOSIS — L02512 Cutaneous abscess of left hand: Secondary | ICD-10-CM | POA: Diagnosis not present

## 2021-03-21 DIAGNOSIS — E8729 Other acidosis: Secondary | ICD-10-CM | POA: Diagnosis not present

## 2021-03-21 DIAGNOSIS — I129 Hypertensive chronic kidney disease with stage 1 through stage 4 chronic kidney disease, or unspecified chronic kidney disease: Secondary | ICD-10-CM | POA: Diagnosis not present

## 2021-03-21 DIAGNOSIS — Z7982 Long term (current) use of aspirin: Secondary | ICD-10-CM | POA: Diagnosis not present

## 2021-03-21 DIAGNOSIS — M1990 Unspecified osteoarthritis, unspecified site: Secondary | ICD-10-CM | POA: Diagnosis not present

## 2021-03-23 DIAGNOSIS — I129 Hypertensive chronic kidney disease with stage 1 through stage 4 chronic kidney disease, or unspecified chronic kidney disease: Secondary | ICD-10-CM | POA: Diagnosis not present

## 2021-03-23 DIAGNOSIS — L03114 Cellulitis of left upper limb: Secondary | ICD-10-CM | POA: Diagnosis not present

## 2021-03-23 DIAGNOSIS — N183 Chronic kidney disease, stage 3 unspecified: Secondary | ICD-10-CM | POA: Diagnosis not present

## 2021-03-23 DIAGNOSIS — L02512 Cutaneous abscess of left hand: Secondary | ICD-10-CM | POA: Diagnosis not present

## 2021-03-23 DIAGNOSIS — M1990 Unspecified osteoarthritis, unspecified site: Secondary | ICD-10-CM | POA: Diagnosis not present

## 2021-03-23 DIAGNOSIS — E8729 Other acidosis: Secondary | ICD-10-CM | POA: Diagnosis not present

## 2021-03-24 DIAGNOSIS — I10 Essential (primary) hypertension: Secondary | ICD-10-CM | POA: Diagnosis not present

## 2021-03-24 DIAGNOSIS — L03114 Cellulitis of left upper limb: Secondary | ICD-10-CM | POA: Diagnosis not present

## 2021-03-24 DIAGNOSIS — N183 Chronic kidney disease, stage 3 unspecified: Secondary | ICD-10-CM | POA: Diagnosis not present

## 2021-03-25 DIAGNOSIS — L03114 Cellulitis of left upper limb: Secondary | ICD-10-CM | POA: Diagnosis not present

## 2021-03-25 DIAGNOSIS — I129 Hypertensive chronic kidney disease with stage 1 through stage 4 chronic kidney disease, or unspecified chronic kidney disease: Secondary | ICD-10-CM | POA: Diagnosis not present

## 2021-03-25 DIAGNOSIS — M1990 Unspecified osteoarthritis, unspecified site: Secondary | ICD-10-CM | POA: Diagnosis not present

## 2021-03-25 DIAGNOSIS — L02512 Cutaneous abscess of left hand: Secondary | ICD-10-CM | POA: Diagnosis not present

## 2021-03-25 DIAGNOSIS — N183 Chronic kidney disease, stage 3 unspecified: Secondary | ICD-10-CM | POA: Diagnosis not present

## 2021-03-25 DIAGNOSIS — E8729 Other acidosis: Secondary | ICD-10-CM | POA: Diagnosis not present

## 2021-03-28 DIAGNOSIS — L02512 Cutaneous abscess of left hand: Secondary | ICD-10-CM | POA: Diagnosis not present

## 2021-03-29 DIAGNOSIS — M1990 Unspecified osteoarthritis, unspecified site: Secondary | ICD-10-CM | POA: Diagnosis not present

## 2021-03-29 DIAGNOSIS — N183 Chronic kidney disease, stage 3 unspecified: Secondary | ICD-10-CM | POA: Diagnosis not present

## 2021-03-29 DIAGNOSIS — L03114 Cellulitis of left upper limb: Secondary | ICD-10-CM | POA: Diagnosis not present

## 2021-03-29 DIAGNOSIS — E8729 Other acidosis: Secondary | ICD-10-CM | POA: Diagnosis not present

## 2021-03-29 DIAGNOSIS — I129 Hypertensive chronic kidney disease with stage 1 through stage 4 chronic kidney disease, or unspecified chronic kidney disease: Secondary | ICD-10-CM | POA: Diagnosis not present

## 2021-03-29 DIAGNOSIS — L02512 Cutaneous abscess of left hand: Secondary | ICD-10-CM | POA: Diagnosis not present

## 2021-03-31 ENCOUNTER — Telehealth: Payer: Self-pay

## 2021-03-31 ENCOUNTER — Telehealth: Payer: Self-pay | Admitting: Cardiology

## 2021-03-31 DIAGNOSIS — L03114 Cellulitis of left upper limb: Secondary | ICD-10-CM | POA: Diagnosis not present

## 2021-03-31 DIAGNOSIS — L02512 Cutaneous abscess of left hand: Secondary | ICD-10-CM | POA: Diagnosis not present

## 2021-03-31 DIAGNOSIS — I129 Hypertensive chronic kidney disease with stage 1 through stage 4 chronic kidney disease, or unspecified chronic kidney disease: Secondary | ICD-10-CM | POA: Diagnosis not present

## 2021-03-31 DIAGNOSIS — E8729 Other acidosis: Secondary | ICD-10-CM | POA: Diagnosis not present

## 2021-03-31 DIAGNOSIS — N183 Chronic kidney disease, stage 3 unspecified: Secondary | ICD-10-CM | POA: Diagnosis not present

## 2021-03-31 DIAGNOSIS — M1990 Unspecified osteoarthritis, unspecified site: Secondary | ICD-10-CM | POA: Diagnosis not present

## 2021-03-31 NOTE — Telephone Encounter (Signed)
Pt needing to speak w / device clinic in regards to transmission... trans the call

## 2021-03-31 NOTE — Telephone Encounter (Signed)
Patient called in stating her monitor is not working. We have called tech support and they are sending a new one since she has a older one and out dated. Patient will receive a new monitor in 7-10 days and will call us when she gets it if she needs help setting it up

## 2021-04-01 DIAGNOSIS — E8729 Other acidosis: Secondary | ICD-10-CM | POA: Diagnosis not present

## 2021-04-01 DIAGNOSIS — L03114 Cellulitis of left upper limb: Secondary | ICD-10-CM | POA: Diagnosis not present

## 2021-04-01 DIAGNOSIS — L02512 Cutaneous abscess of left hand: Secondary | ICD-10-CM | POA: Diagnosis not present

## 2021-04-01 DIAGNOSIS — I129 Hypertensive chronic kidney disease with stage 1 through stage 4 chronic kidney disease, or unspecified chronic kidney disease: Secondary | ICD-10-CM | POA: Diagnosis not present

## 2021-04-01 DIAGNOSIS — M1990 Unspecified osteoarthritis, unspecified site: Secondary | ICD-10-CM | POA: Diagnosis not present

## 2021-04-01 DIAGNOSIS — N183 Chronic kidney disease, stage 3 unspecified: Secondary | ICD-10-CM | POA: Diagnosis not present

## 2021-04-04 DIAGNOSIS — L02512 Cutaneous abscess of left hand: Secondary | ICD-10-CM | POA: Diagnosis not present

## 2021-04-05 ENCOUNTER — Ambulatory Visit (INDEPENDENT_AMBULATORY_CARE_PROVIDER_SITE_OTHER): Payer: Medicare Other

## 2021-04-05 DIAGNOSIS — N183 Chronic kidney disease, stage 3 unspecified: Secondary | ICD-10-CM | POA: Diagnosis not present

## 2021-04-05 DIAGNOSIS — I420 Dilated cardiomyopathy: Secondary | ICD-10-CM

## 2021-04-05 DIAGNOSIS — I129 Hypertensive chronic kidney disease with stage 1 through stage 4 chronic kidney disease, or unspecified chronic kidney disease: Secondary | ICD-10-CM | POA: Diagnosis not present

## 2021-04-05 DIAGNOSIS — M1990 Unspecified osteoarthritis, unspecified site: Secondary | ICD-10-CM | POA: Diagnosis not present

## 2021-04-05 DIAGNOSIS — E8729 Other acidosis: Secondary | ICD-10-CM | POA: Diagnosis not present

## 2021-04-05 DIAGNOSIS — L02512 Cutaneous abscess of left hand: Secondary | ICD-10-CM | POA: Diagnosis not present

## 2021-04-05 DIAGNOSIS — L03114 Cellulitis of left upper limb: Secondary | ICD-10-CM | POA: Diagnosis not present

## 2021-04-05 LAB — CUP PACEART REMOTE DEVICE CHECK
Battery Impedance: 3206 Ohm
Battery Remaining Longevity: 26 mo
Battery Voltage: 2.72 V
Brady Statistic RV Percent Paced: 8 %
Date Time Interrogation Session: 20230117110612
Implantable Lead Implant Date: 20120911
Implantable Lead Implant Date: 20120911
Implantable Lead Location: 753859
Implantable Lead Location: 753860
Implantable Lead Model: 4092
Implantable Lead Model: 5076
Implantable Pulse Generator Implant Date: 20120911
Lead Channel Impedance Value: 666 Ohm
Lead Channel Impedance Value: 67 Ohm
Lead Channel Pacing Threshold Amplitude: 0.625 V
Lead Channel Pacing Threshold Pulse Width: 0.4 ms
Lead Channel Setting Pacing Amplitude: 2 V
Lead Channel Setting Pacing Pulse Width: 0.4 ms
Lead Channel Setting Sensing Sensitivity: 5.6 mV

## 2021-04-06 DIAGNOSIS — I129 Hypertensive chronic kidney disease with stage 1 through stage 4 chronic kidney disease, or unspecified chronic kidney disease: Secondary | ICD-10-CM | POA: Diagnosis not present

## 2021-04-06 DIAGNOSIS — L03114 Cellulitis of left upper limb: Secondary | ICD-10-CM | POA: Diagnosis not present

## 2021-04-06 DIAGNOSIS — M1990 Unspecified osteoarthritis, unspecified site: Secondary | ICD-10-CM | POA: Diagnosis not present

## 2021-04-06 DIAGNOSIS — E8729 Other acidosis: Secondary | ICD-10-CM | POA: Diagnosis not present

## 2021-04-06 DIAGNOSIS — L02512 Cutaneous abscess of left hand: Secondary | ICD-10-CM | POA: Diagnosis not present

## 2021-04-06 DIAGNOSIS — N183 Chronic kidney disease, stage 3 unspecified: Secondary | ICD-10-CM | POA: Diagnosis not present

## 2021-04-08 DIAGNOSIS — L03114 Cellulitis of left upper limb: Secondary | ICD-10-CM | POA: Diagnosis not present

## 2021-04-08 DIAGNOSIS — N183 Chronic kidney disease, stage 3 unspecified: Secondary | ICD-10-CM | POA: Diagnosis not present

## 2021-04-08 DIAGNOSIS — M1990 Unspecified osteoarthritis, unspecified site: Secondary | ICD-10-CM | POA: Diagnosis not present

## 2021-04-08 DIAGNOSIS — I129 Hypertensive chronic kidney disease with stage 1 through stage 4 chronic kidney disease, or unspecified chronic kidney disease: Secondary | ICD-10-CM | POA: Diagnosis not present

## 2021-04-08 DIAGNOSIS — E8729 Other acidosis: Secondary | ICD-10-CM | POA: Diagnosis not present

## 2021-04-08 DIAGNOSIS — L02512 Cutaneous abscess of left hand: Secondary | ICD-10-CM | POA: Diagnosis not present

## 2021-04-11 DIAGNOSIS — M1990 Unspecified osteoarthritis, unspecified site: Secondary | ICD-10-CM | POA: Diagnosis not present

## 2021-04-11 DIAGNOSIS — E8729 Other acidosis: Secondary | ICD-10-CM | POA: Diagnosis not present

## 2021-04-11 DIAGNOSIS — N183 Chronic kidney disease, stage 3 unspecified: Secondary | ICD-10-CM | POA: Diagnosis not present

## 2021-04-11 DIAGNOSIS — L03114 Cellulitis of left upper limb: Secondary | ICD-10-CM | POA: Diagnosis not present

## 2021-04-11 DIAGNOSIS — I129 Hypertensive chronic kidney disease with stage 1 through stage 4 chronic kidney disease, or unspecified chronic kidney disease: Secondary | ICD-10-CM | POA: Diagnosis not present

## 2021-04-11 DIAGNOSIS — L02512 Cutaneous abscess of left hand: Secondary | ICD-10-CM | POA: Diagnosis not present

## 2021-04-13 DIAGNOSIS — L03114 Cellulitis of left upper limb: Secondary | ICD-10-CM | POA: Diagnosis not present

## 2021-04-13 DIAGNOSIS — M1990 Unspecified osteoarthritis, unspecified site: Secondary | ICD-10-CM | POA: Diagnosis not present

## 2021-04-13 DIAGNOSIS — E8729 Other acidosis: Secondary | ICD-10-CM | POA: Diagnosis not present

## 2021-04-13 DIAGNOSIS — L02512 Cutaneous abscess of left hand: Secondary | ICD-10-CM | POA: Diagnosis not present

## 2021-04-13 DIAGNOSIS — N183 Chronic kidney disease, stage 3 unspecified: Secondary | ICD-10-CM | POA: Diagnosis not present

## 2021-04-13 DIAGNOSIS — I129 Hypertensive chronic kidney disease with stage 1 through stage 4 chronic kidney disease, or unspecified chronic kidney disease: Secondary | ICD-10-CM | POA: Diagnosis not present

## 2021-04-15 DIAGNOSIS — M1990 Unspecified osteoarthritis, unspecified site: Secondary | ICD-10-CM | POA: Diagnosis not present

## 2021-04-15 DIAGNOSIS — I129 Hypertensive chronic kidney disease with stage 1 through stage 4 chronic kidney disease, or unspecified chronic kidney disease: Secondary | ICD-10-CM | POA: Diagnosis not present

## 2021-04-15 DIAGNOSIS — L03114 Cellulitis of left upper limb: Secondary | ICD-10-CM | POA: Diagnosis not present

## 2021-04-15 DIAGNOSIS — L02512 Cutaneous abscess of left hand: Secondary | ICD-10-CM | POA: Diagnosis not present

## 2021-04-15 DIAGNOSIS — N183 Chronic kidney disease, stage 3 unspecified: Secondary | ICD-10-CM | POA: Diagnosis not present

## 2021-04-15 DIAGNOSIS — E8729 Other acidosis: Secondary | ICD-10-CM | POA: Diagnosis not present

## 2021-04-18 DIAGNOSIS — L02512 Cutaneous abscess of left hand: Secondary | ICD-10-CM | POA: Diagnosis not present

## 2021-04-18 NOTE — Progress Notes (Signed)
Remote pacemaker transmission.   

## 2021-04-19 DIAGNOSIS — M810 Age-related osteoporosis without current pathological fracture: Secondary | ICD-10-CM | POA: Diagnosis not present

## 2021-04-20 DIAGNOSIS — L02512 Cutaneous abscess of left hand: Secondary | ICD-10-CM | POA: Diagnosis not present

## 2021-04-20 DIAGNOSIS — Z7982 Long term (current) use of aspirin: Secondary | ICD-10-CM | POA: Diagnosis not present

## 2021-04-20 DIAGNOSIS — I129 Hypertensive chronic kidney disease with stage 1 through stage 4 chronic kidney disease, or unspecified chronic kidney disease: Secondary | ICD-10-CM | POA: Diagnosis not present

## 2021-04-20 DIAGNOSIS — M1990 Unspecified osteoarthritis, unspecified site: Secondary | ICD-10-CM | POA: Diagnosis not present

## 2021-04-20 DIAGNOSIS — L03114 Cellulitis of left upper limb: Secondary | ICD-10-CM | POA: Diagnosis not present

## 2021-04-20 DIAGNOSIS — Z950 Presence of cardiac pacemaker: Secondary | ICD-10-CM | POA: Diagnosis not present

## 2021-04-20 DIAGNOSIS — E8729 Other acidosis: Secondary | ICD-10-CM | POA: Diagnosis not present

## 2021-04-20 DIAGNOSIS — N183 Chronic kidney disease, stage 3 unspecified: Secondary | ICD-10-CM | POA: Diagnosis not present

## 2021-04-22 DIAGNOSIS — L02512 Cutaneous abscess of left hand: Secondary | ICD-10-CM | POA: Diagnosis not present

## 2021-04-22 DIAGNOSIS — E8729 Other acidosis: Secondary | ICD-10-CM | POA: Diagnosis not present

## 2021-04-22 DIAGNOSIS — N183 Chronic kidney disease, stage 3 unspecified: Secondary | ICD-10-CM | POA: Diagnosis not present

## 2021-04-22 DIAGNOSIS — I129 Hypertensive chronic kidney disease with stage 1 through stage 4 chronic kidney disease, or unspecified chronic kidney disease: Secondary | ICD-10-CM | POA: Diagnosis not present

## 2021-04-22 DIAGNOSIS — M1990 Unspecified osteoarthritis, unspecified site: Secondary | ICD-10-CM | POA: Diagnosis not present

## 2021-04-22 DIAGNOSIS — L03114 Cellulitis of left upper limb: Secondary | ICD-10-CM | POA: Diagnosis not present

## 2021-04-25 DIAGNOSIS — M8580 Other specified disorders of bone density and structure, unspecified site: Secondary | ICD-10-CM | POA: Diagnosis not present

## 2021-04-25 DIAGNOSIS — L02512 Cutaneous abscess of left hand: Secondary | ICD-10-CM | POA: Diagnosis not present

## 2021-04-27 DIAGNOSIS — I129 Hypertensive chronic kidney disease with stage 1 through stage 4 chronic kidney disease, or unspecified chronic kidney disease: Secondary | ICD-10-CM | POA: Diagnosis not present

## 2021-04-27 DIAGNOSIS — L03114 Cellulitis of left upper limb: Secondary | ICD-10-CM | POA: Diagnosis not present

## 2021-04-27 DIAGNOSIS — N183 Chronic kidney disease, stage 3 unspecified: Secondary | ICD-10-CM | POA: Diagnosis not present

## 2021-04-27 DIAGNOSIS — L02512 Cutaneous abscess of left hand: Secondary | ICD-10-CM | POA: Diagnosis not present

## 2021-04-27 DIAGNOSIS — E8729 Other acidosis: Secondary | ICD-10-CM | POA: Diagnosis not present

## 2021-04-27 DIAGNOSIS — M1990 Unspecified osteoarthritis, unspecified site: Secondary | ICD-10-CM | POA: Diagnosis not present

## 2021-04-29 DIAGNOSIS — L03114 Cellulitis of left upper limb: Secondary | ICD-10-CM | POA: Diagnosis not present

## 2021-04-29 DIAGNOSIS — L02512 Cutaneous abscess of left hand: Secondary | ICD-10-CM | POA: Diagnosis not present

## 2021-04-29 DIAGNOSIS — E8729 Other acidosis: Secondary | ICD-10-CM | POA: Diagnosis not present

## 2021-04-29 DIAGNOSIS — M1990 Unspecified osteoarthritis, unspecified site: Secondary | ICD-10-CM | POA: Diagnosis not present

## 2021-04-29 DIAGNOSIS — N183 Chronic kidney disease, stage 3 unspecified: Secondary | ICD-10-CM | POA: Diagnosis not present

## 2021-04-29 DIAGNOSIS — I129 Hypertensive chronic kidney disease with stage 1 through stage 4 chronic kidney disease, or unspecified chronic kidney disease: Secondary | ICD-10-CM | POA: Diagnosis not present

## 2021-05-02 DIAGNOSIS — L02512 Cutaneous abscess of left hand: Secondary | ICD-10-CM | POA: Diagnosis not present

## 2021-05-04 DIAGNOSIS — L02512 Cutaneous abscess of left hand: Secondary | ICD-10-CM | POA: Diagnosis not present

## 2021-05-04 DIAGNOSIS — E8729 Other acidosis: Secondary | ICD-10-CM | POA: Diagnosis not present

## 2021-05-04 DIAGNOSIS — N183 Chronic kidney disease, stage 3 unspecified: Secondary | ICD-10-CM | POA: Diagnosis not present

## 2021-05-04 DIAGNOSIS — L03114 Cellulitis of left upper limb: Secondary | ICD-10-CM | POA: Diagnosis not present

## 2021-05-04 DIAGNOSIS — I129 Hypertensive chronic kidney disease with stage 1 through stage 4 chronic kidney disease, or unspecified chronic kidney disease: Secondary | ICD-10-CM | POA: Diagnosis not present

## 2021-05-04 DIAGNOSIS — M1990 Unspecified osteoarthritis, unspecified site: Secondary | ICD-10-CM | POA: Diagnosis not present

## 2021-05-06 DIAGNOSIS — L02512 Cutaneous abscess of left hand: Secondary | ICD-10-CM | POA: Diagnosis not present

## 2021-05-06 DIAGNOSIS — L03114 Cellulitis of left upper limb: Secondary | ICD-10-CM | POA: Diagnosis not present

## 2021-05-06 DIAGNOSIS — E8729 Other acidosis: Secondary | ICD-10-CM | POA: Diagnosis not present

## 2021-05-06 DIAGNOSIS — I129 Hypertensive chronic kidney disease with stage 1 through stage 4 chronic kidney disease, or unspecified chronic kidney disease: Secondary | ICD-10-CM | POA: Diagnosis not present

## 2021-05-06 DIAGNOSIS — M1990 Unspecified osteoarthritis, unspecified site: Secondary | ICD-10-CM | POA: Diagnosis not present

## 2021-05-06 DIAGNOSIS — N183 Chronic kidney disease, stage 3 unspecified: Secondary | ICD-10-CM | POA: Diagnosis not present

## 2021-05-09 DIAGNOSIS — L02512 Cutaneous abscess of left hand: Secondary | ICD-10-CM | POA: Diagnosis not present

## 2021-05-10 DIAGNOSIS — M5440 Lumbago with sciatica, unspecified side: Secondary | ICD-10-CM | POA: Diagnosis not present

## 2021-05-10 DIAGNOSIS — Z0000 Encounter for general adult medical examination without abnormal findings: Secondary | ICD-10-CM | POA: Diagnosis not present

## 2021-05-10 DIAGNOSIS — Z79899 Other long term (current) drug therapy: Secondary | ICD-10-CM | POA: Diagnosis not present

## 2021-05-11 DIAGNOSIS — L02512 Cutaneous abscess of left hand: Secondary | ICD-10-CM | POA: Diagnosis not present

## 2021-05-11 DIAGNOSIS — E8729 Other acidosis: Secondary | ICD-10-CM | POA: Diagnosis not present

## 2021-05-11 DIAGNOSIS — N183 Chronic kidney disease, stage 3 unspecified: Secondary | ICD-10-CM | POA: Diagnosis not present

## 2021-05-11 DIAGNOSIS — L03114 Cellulitis of left upper limb: Secondary | ICD-10-CM | POA: Diagnosis not present

## 2021-05-11 DIAGNOSIS — I129 Hypertensive chronic kidney disease with stage 1 through stage 4 chronic kidney disease, or unspecified chronic kidney disease: Secondary | ICD-10-CM | POA: Diagnosis not present

## 2021-05-11 DIAGNOSIS — M1990 Unspecified osteoarthritis, unspecified site: Secondary | ICD-10-CM | POA: Diagnosis not present

## 2021-05-16 DIAGNOSIS — R600 Localized edema: Secondary | ICD-10-CM | POA: Diagnosis not present

## 2021-05-16 DIAGNOSIS — L02512 Cutaneous abscess of left hand: Secondary | ICD-10-CM | POA: Diagnosis not present

## 2021-05-18 DIAGNOSIS — M1990 Unspecified osteoarthritis, unspecified site: Secondary | ICD-10-CM | POA: Diagnosis not present

## 2021-05-18 DIAGNOSIS — L02512 Cutaneous abscess of left hand: Secondary | ICD-10-CM | POA: Diagnosis not present

## 2021-05-18 DIAGNOSIS — N183 Chronic kidney disease, stage 3 unspecified: Secondary | ICD-10-CM | POA: Diagnosis not present

## 2021-05-18 DIAGNOSIS — L03114 Cellulitis of left upper limb: Secondary | ICD-10-CM | POA: Diagnosis not present

## 2021-05-18 DIAGNOSIS — E8729 Other acidosis: Secondary | ICD-10-CM | POA: Diagnosis not present

## 2021-05-18 DIAGNOSIS — I129 Hypertensive chronic kidney disease with stage 1 through stage 4 chronic kidney disease, or unspecified chronic kidney disease: Secondary | ICD-10-CM | POA: Diagnosis not present

## 2021-05-19 ENCOUNTER — Other Ambulatory Visit: Payer: Self-pay

## 2021-05-19 ENCOUNTER — Encounter: Payer: Self-pay | Admitting: Cardiology

## 2021-05-19 ENCOUNTER — Ambulatory Visit (INDEPENDENT_AMBULATORY_CARE_PROVIDER_SITE_OTHER): Payer: Medicare Other | Admitting: Cardiology

## 2021-05-19 VITALS — BP 148/94 | HR 108 | Ht 60.0 in | Wt 133.8 lb

## 2021-05-19 DIAGNOSIS — I359 Nonrheumatic aortic valve disorder, unspecified: Secondary | ICD-10-CM | POA: Diagnosis not present

## 2021-05-19 DIAGNOSIS — I420 Dilated cardiomyopathy: Secondary | ICD-10-CM | POA: Diagnosis not present

## 2021-05-19 DIAGNOSIS — I10 Essential (primary) hypertension: Secondary | ICD-10-CM

## 2021-05-19 DIAGNOSIS — Z950 Presence of cardiac pacemaker: Secondary | ICD-10-CM | POA: Diagnosis not present

## 2021-05-19 DIAGNOSIS — I482 Chronic atrial fibrillation, unspecified: Secondary | ICD-10-CM

## 2021-05-19 NOTE — Progress Notes (Signed)
Cardiology Office Note:    Date:  05/19/2021   ID:  Kaitlyn, [name] 01/12/1932, MRN [id number]  PCP:  Nicoletta Dress, MD  Cardiologist:  Jenne Campus, MD    Referring MD: Nicoletta Dress, MD   Chief Complaint  Patient presents with   Leg Swelling    3 weeks     History of Present Illness:    [name] is a 88 y.o. female with complex past medical history.  That include cardiomyopathy with normalization, last echocardiogram in November 2021 however showed ejection fraction 45%, permanent atrial fibrillation, she is refusing anticoagulation, pacemaker present which is Medtronic device, she does have congestive heart failure.  Recently she ended up having abscess in her and required surgical intervention.  Went to surgery with no difficulty and doing well thereafter. Concerns is swelling of lower extremities gradually getting worse.  She was switched from Lasix to torsemide 50 mg twice daily in spite of that swelling is still present much better however she described the fact that she is fatigue tiredness abdomen she gets weekly she is getting dizzy.  Denies have any palpitations, there is no chest pain tightness squeezing pressure burning chest.  Past Medical History:  Diagnosis Date   A-fib ([place])    Anemia    Aortic valve disorder    Arthritis    osteoarthritis - back and knee   Benign hypertensive heart disease without heart failure    Blood transfusion    Body mass index (BMI) 27.0-27.9, adult 01/01/2020   Carpal tunnel syndrome 02/19/2018   Cataract    bilateral   Chest pain at rest 08/18/2014   Chronic atrial fibrillation ([place]) 07/12/2017   Chronic back pain    Chronic thoracic back pain 04/17/2013   Congestive heart failure (CHF) ([place]) 06/03/2020   Degeneration of lumbosacral intervertebral disc 03/31/2014   Dilated cardiomyopathy ([place]) 09/17/2017   Displacement of cervical intervertebral disc without myelopathy 10/22/2014   Displacement of lumbar  intervertebral disc without myelopathy 09/24/2012   Diverticulosis    Essential hypertension 10/06/2013   GERD (gastroesophageal reflux disease)    Hematoma 11/04/2013   History of stomach ulcers    Hyperlipidemia    Hypertension    Impingement syndrome of shoulder region 05/17/2014   Lumbar foraminal stenosis 04/19/2020   Neck pain 10/22/2014   Opioid dependence ([place]) 04/19/2020   Osteoarthrosis    Other long term (current) drug therapy 04/19/2020   Pacemaker    Pacemaker reprogramming/check 08/18/2014   medtronic  Device implantedSeptember 2012 by Angie Fava of this note might be different from the original. Overview:  Overview:  medtronic  Device implantedSeptember 2012 by Agustin Cree   Pain in upper limb 04/27/2015   Paroxysmal atrial fibrillation ([place]) 08/18/2014   Formatting of this note might be different from the original. Not anticoagulated secondary to intracranial bleed while on coumadin   Permanent atrial fibrillation ([place]) 02/11/2019   Rotator cuff tear arthropathy of right shoulder 11/16/2015   S/P shoulder replacement 11/16/2015   Shoulder impingement 05/17/2014   Spinal stenosis 03/31/2014   Status post lumbar surgery    Vitamin D deficiency     Past Surgical History:  Procedure Laterality Date   ABDOMINAL HYSTERECTOMY     APPENDECTOMY     BACK SURGERY     five back surgeries   cataract extaction -left     cataract extraction right     CHOLECYSTECTOMY     COLONOSCOPY     CRANIOTOMY     ?  subdural hematoma?  Sustained head injury after falling while taking Coumadin   ESOPHAGOGASTRODUODENOSCOPY     EYE SURGERY Bilateral    cataract surgery with lens implant   FRACTURE SURGERY     left wrist, with plate and removal of plate   INSERT / REPLACE / REMOVE PACEMAKER     Medtronic   KNEE ARTHROPLASTY     right knee   REVERSE SHOULDER ARTHROPLASTY Right 11/16/2015   REVERSE SHOULDER ARTHROPLASTY Right 11/16/2015   Procedure: REVERSE SHOULDER ARTHROPLASTY;  Surgeon:  Marchia Bond, MD;  Location: [facility];  Service: Orthopedics;  Laterality: Right;   S1 vertebroplasty     SHOULDER OPEN ROTATOR CUFF REPAIR  right   Skin cancer removal from L arm  11/02/2020    Current Medications: Current Meds  Medication Sig   aspirin EC 81 MG tablet Take 81 mg by mouth at bedtime.   Cholecalciferol (VITAMIN D3 PO) Take 1 tablet by mouth daily.   denosumab (PROLIA) 60 MG/ML SOSY injection Inject 60 mg into the skin every 6 (six) months.   diltiazem (CARDIZEM CD) 240 MG 24 hr capsule TAKE 1 CAPSULE BY MOUTH EVERY DAY   famotidine (PEPCID) 40 MG tablet Take 40 mg by mouth daily.   furosemide (LASIX) 40 MG tablet Take 1.5 tablets (60 mg total) by mouth daily.   ibuprofen (ADVIL) 800 MG tablet Take 800 mg by mouth every 8 (eight) hours as needed for mild pain or moderate pain.   Multiple Vitamin (MULTIVITAMIN) tablet Take 1 tablet by mouth daily. Unknown strength   oxyCODONE-acetaminophen (PERCOCET) 10-325 MG tablet Take 1 tablet by mouth every 6 (six) hours as needed for pain.   pantoprazole (PROTONIX) 40 MG tablet Take 40 mg by mouth daily.   torsemide (DEMADEX) 100 MG tablet Take 50 mg by mouth 2 (two) times daily.   [DISCONTINUED] ALPRAZolam (XANAX) 0.25 MG tablet Take 0.25 mg by mouth 3 (three) times daily as needed for anxiety.     Allergies:   Amlodipine, Levofloxacin, and Penicillins   Social History   Socioeconomic History   Marital status: Widowed    Spouse name: Not on file   Number of children: 4   Years of education: 22   Highest education level: Not on file  Occupational History   Occupation: retired  Tobacco Use   Smoking status: Never   Smokeless tobacco: Never  Vaping Use   Vaping Use: Never used  Substance and Sexual Activity   Alcohol use: No   Drug use: No   Sexual activity: Not on file  Other Topics Concern   Not on file  Social History Narrative   Lives alone , One story   Right handed   She retired as a Regulatory affairs officer.    Social  Determinants of Health   Financial Resource Strain: Not on file  Food Insecurity: Not on file  Transportation Needs: Not on file  Physical Activity: Not on file  Stress: Not on file  Social Connections: Not on file     Family History: The patient's family history includes Alcoholism in her father; Diabetes in her brother, brother, brother, and mother; Throat cancer in her brother; Uterine cancer in her mother. ROS:   Please see the history of present illness.    All 14 point review of systems negative except as described per history of present illness  EKGs/Labs/Other Studies Reviewed:      Recent Labs: 08/26/2020: BUN 28; Creatinine, Ser 1.12; Potassium 5.3; Sodium 139  Recent  Lipid Panel No results found for: CHOL, TRIG, HDL, CHOLHDL, VLDL, LDLCALC, LDLDIRECT  Physical Exam:    VS:  BP (!) 148/94 (BP Location: Right Arm, Patient Position: Sitting)    Pulse (!) 108    Ht 5' (1.524 m)    Wt 133 lb 12.8 oz (60.7 kg)    SpO2 98%    BMI 26.13 kg/m     Wt Readings from Last 3 Encounters:  05/19/21 133 lb 12.8 oz (60.7 kg)  12/15/20 139 lb 3.2 oz (63.1 kg)  08/19/20 132 lb (59.9 kg)     GEN:  Well nourished, well developed in no acute distress HEENT: Normal NECK: No JVD; No carotid bruits LYMPHATICS: No lymphadenopathy CARDIAC: Irregularly irregular, no murmurs, no rubs, no gallops RESPIRATORY:  Clear to auscultation without rales, wheezing or rhonchi  ABDOMEN: Soft, non-tender, non-distended MUSCULOSKELETAL:  No edema; No deformity  SKIN: Warm and dry LOWER EXTREMITIES: 2+ swelling bilaterally NEUROLOGIC:  Alert and oriented x 3 PSYCHIATRIC:  Normal affect   ASSESSMENT:    1. Dilated cardiomyopathy ([place])   2. Chronic atrial fibrillation (H[facility])   3. Essential hypertension   4. Aortic valve disorder   5. Pacemaker    PLAN:    In order of problems listed above:  Congestive heart failure.  Her lower extremities are swollen.  She is taking torsemide 50 twice daily, I  will check her potassium and check kidney function today based on that I will decide what to do next.  Anticipate need to add some Zaroxolyn. Permanent atrial fibrillation rate is a little excessive that is probably because of decompensation of CHF.  She refused anticoagulation. History of cardiomyopathy.  Plan to repeat her echocardiogram since she does have decompensated CHF today on the physical exam. Pacemaker present, last interrogation January 17 showing 26 months left in the device.  Normal function   Medication Adjustments/Labs and Tests Ordered: Current medicines are reviewed at length with the patient today.  Concerns regarding medicines are outlined above.  No orders of the defined types were placed in this encounter.  Medication changes: No orders of the defined types were placed in this encounter.   Signed, Park Liter, MD, [facility] 05/19/2021 4:25 PM    [employer]

## 2021-05-19 NOTE — Patient Instructions (Signed)
Medication Instructions:  ?Your physician recommends that you continue on your current medications as directed. Please refer to the Current Medication list given to you today. ? ?*If you need a refill on your cardiac medications before your next appointment, please call your pharmacy* ? ? ?Lab Work: ?Your physician recommends that you return for lab work in:  ? ?Labs today: BMP, Pro BNP, Magnesium, D-dimer ? ?If you have labs (blood work) drawn today and your tests are completely normal, you will receive your results only by: ?MyChart Message (if you have MyChart) OR ?A paper copy in the mail ?If you have any lab test that is abnormal or we need to change your treatment, we will call you to review the results. ? ? ?Testing/Procedures: ?Your physician has requested that you have an echocardiogram. Echocardiography is a painless test that uses sound waves to create images of your heart. It provides your doctor with information about the size and shape of your heart and how well your heart?s chambers and valves are working. This procedure takes approximately one hour. There are no restrictions for this procedure. ? ? ? ?Follow-Up: ?At [facility], you and your health needs are our priority.  As part of our continuing mission to provide you with exceptional heart care, we have created designated Provider Care Teams.  These Care Teams include your primary Cardiologist (physician) and Advanced Practice Providers (APPs -  Physician Assistants and Nurse Practitioners) who all work together to provide you with the care you need, when you need it. ? ?We recommend signing up for the patient portal called "MyChart".  Sign up information is provided on this After Visit Summary.  MyChart is used to connect with patients for Virtual Visits (Telemedicine).  Patients are able to view lab/test results, encounter notes, upcoming appointments, etc.  Non-urgent messages can be sent to your provider as well.   ?To learn more about  what you can do with MyChart, go to [email or web address].   ? ?Your next appointment:   ?1 month(s) ? ?The format for your next appointment:   ?In Person ? ?Provider:   ?Jenne Campus, MD  ? ? ?Other Instructions ?None ? ?

## 2021-05-19 NOTE — Addendum Note (Signed)
Addended by: Edwyna Shell I on: 05/19/2021 04:43 PM ? ? Modules accepted: Orders ? ?

## 2021-05-20 DIAGNOSIS — I129 Hypertensive chronic kidney disease with stage 1 through stage 4 chronic kidney disease, or unspecified chronic kidney disease: Secondary | ICD-10-CM | POA: Diagnosis not present

## 2021-05-20 DIAGNOSIS — E8729 Other acidosis: Secondary | ICD-10-CM | POA: Diagnosis not present

## 2021-05-20 DIAGNOSIS — N183 Chronic kidney disease, stage 3 unspecified: Secondary | ICD-10-CM | POA: Diagnosis not present

## 2021-05-20 DIAGNOSIS — M1990 Unspecified osteoarthritis, unspecified site: Secondary | ICD-10-CM | POA: Diagnosis not present

## 2021-05-20 DIAGNOSIS — L03114 Cellulitis of left upper limb: Secondary | ICD-10-CM | POA: Diagnosis not present

## 2021-05-20 DIAGNOSIS — Z7982 Long term (current) use of aspirin: Secondary | ICD-10-CM | POA: Diagnosis not present

## 2021-05-20 DIAGNOSIS — Z950 Presence of cardiac pacemaker: Secondary | ICD-10-CM | POA: Diagnosis not present

## 2021-05-20 DIAGNOSIS — L02512 Cutaneous abscess of left hand: Secondary | ICD-10-CM | POA: Diagnosis not present

## 2021-05-20 LAB — PRO B NATRIURETIC PEPTIDE: NT-Pro BNP: 5610 pg/mL — ABNORMAL HIGH (ref 0–738)

## 2021-05-20 LAB — BASIC METABOLIC PANEL
BUN/Creatinine Ratio: 22 (ref 12–28)
BUN: 31 mg/dL — ABNORMAL HIGH (ref 8–27)
CO2: 31 mmol/L — ABNORMAL HIGH (ref 20–29)
Calcium: 9.5 mg/dL (ref 8.7–10.3)
Chloride: 101 mmol/L (ref 96–106)
Creatinine, Ser: 1.43 mg/dL — ABNORMAL HIGH (ref 0.57–1.00)
Glucose: 103 mg/dL — ABNORMAL HIGH (ref 70–99)
Potassium: 4.1 mmol/L (ref 3.5–5.2)
Sodium: 148 mmol/L — ABNORMAL HIGH (ref 134–144)
eGFR: 35 mL/min/{1.73_m2} — ABNORMAL LOW (ref >59)

## 2021-05-20 LAB — D-DIMER, QUANTITATIVE: D-DIMER: 1.87 mg/L FEU — ABNORMAL HIGH (ref 0.00–0.49)

## 2021-05-20 LAB — MAGNESIUM: Magnesium: 1.8 mg/dL (ref 1.6–2.3)

## 2021-05-23 DIAGNOSIS — Z789 Other specified health status: Secondary | ICD-10-CM | POA: Diagnosis not present

## 2021-05-23 DIAGNOSIS — I10 Essential (primary) hypertension: Secondary | ICD-10-CM | POA: Diagnosis not present

## 2021-05-23 DIAGNOSIS — D638 Anemia in other chronic diseases classified elsewhere: Secondary | ICD-10-CM | POA: Diagnosis not present

## 2021-05-23 DIAGNOSIS — F419 Anxiety disorder, unspecified: Secondary | ICD-10-CM | POA: Diagnosis not present

## 2021-05-23 DIAGNOSIS — E785 Hyperlipidemia, unspecified: Secondary | ICD-10-CM | POA: Diagnosis not present

## 2021-05-23 DIAGNOSIS — I480 Paroxysmal atrial fibrillation: Secondary | ICD-10-CM | POA: Diagnosis not present

## 2021-05-24 DIAGNOSIS — D539 Nutritional anemia, unspecified: Secondary | ICD-10-CM | POA: Diagnosis not present

## 2021-05-26 ENCOUNTER — Ambulatory Visit (INDEPENDENT_AMBULATORY_CARE_PROVIDER_SITE_OTHER): Payer: Medicare Other

## 2021-05-26 ENCOUNTER — Telehealth: Payer: Self-pay | Admitting: Cardiology

## 2021-05-26 ENCOUNTER — Other Ambulatory Visit: Payer: Self-pay

## 2021-05-26 DIAGNOSIS — I482 Chronic atrial fibrillation, unspecified: Secondary | ICD-10-CM | POA: Diagnosis not present

## 2021-05-26 DIAGNOSIS — I420 Dilated cardiomyopathy: Secondary | ICD-10-CM | POA: Diagnosis not present

## 2021-05-26 DIAGNOSIS — I359 Nonrheumatic aortic valve disorder, unspecified: Secondary | ICD-10-CM | POA: Diagnosis not present

## 2021-05-26 DIAGNOSIS — I10 Essential (primary) hypertension: Secondary | ICD-10-CM | POA: Diagnosis not present

## 2021-05-26 DIAGNOSIS — Z950 Presence of cardiac pacemaker: Secondary | ICD-10-CM

## 2021-05-26 LAB — ECHOCARDIOGRAM COMPLETE
Area-P 1/2: 4.06 cm2
MV M vel: 5.04 m/s
MV Peak grad: 101.6 mmHg
P 1/2 time: 511 ms
Radius: 0.2 cm
S' Lateral: 3.9 cm

## 2021-05-26 NOTE — Telephone Encounter (Signed)
Patient would like a call back about her labs, she says she never received a message. ? ?Thank you! ? ?[phone] best number  ? ? [name] ?

## 2021-05-27 DIAGNOSIS — M1990 Unspecified osteoarthritis, unspecified site: Secondary | ICD-10-CM | POA: Diagnosis not present

## 2021-05-27 DIAGNOSIS — N183 Chronic kidney disease, stage 3 unspecified: Secondary | ICD-10-CM | POA: Diagnosis not present

## 2021-05-27 DIAGNOSIS — E8729 Other acidosis: Secondary | ICD-10-CM | POA: Diagnosis not present

## 2021-05-27 DIAGNOSIS — L02512 Cutaneous abscess of left hand: Secondary | ICD-10-CM | POA: Diagnosis not present

## 2021-05-27 DIAGNOSIS — L03114 Cellulitis of left upper limb: Secondary | ICD-10-CM | POA: Diagnosis not present

## 2021-05-27 DIAGNOSIS — I129 Hypertensive chronic kidney disease with stage 1 through stage 4 chronic kidney disease, or unspecified chronic kidney disease: Secondary | ICD-10-CM | POA: Diagnosis not present

## 2021-05-27 NOTE — Telephone Encounter (Signed)
Discussed lab result. ?Pt reports that she is taking Torsemide currently and not Lasix. ?Order/recommendation by Agustin Cree was in relation to Lasix. ?Will forward back to MD and covering RN to address with pt. ? ?(Dr. Raliegh Ip also want dopplers ordered d/t + DDimer.  Will allow that RN to address this as well with pt)  ?

## 2021-05-27 NOTE — Telephone Encounter (Signed)
Patient calling back.   °

## 2021-05-30 DIAGNOSIS — L02512 Cutaneous abscess of left hand: Secondary | ICD-10-CM | POA: Diagnosis not present

## 2021-05-30 DIAGNOSIS — E8729 Other acidosis: Secondary | ICD-10-CM | POA: Diagnosis not present

## 2021-05-30 DIAGNOSIS — N183 Chronic kidney disease, stage 3 unspecified: Secondary | ICD-10-CM | POA: Diagnosis not present

## 2021-05-30 DIAGNOSIS — M1990 Unspecified osteoarthritis, unspecified site: Secondary | ICD-10-CM | POA: Diagnosis not present

## 2021-05-30 DIAGNOSIS — I129 Hypertensive chronic kidney disease with stage 1 through stage 4 chronic kidney disease, or unspecified chronic kidney disease: Secondary | ICD-10-CM | POA: Diagnosis not present

## 2021-05-30 DIAGNOSIS — L03114 Cellulitis of left upper limb: Secondary | ICD-10-CM | POA: Diagnosis not present

## 2021-05-31 NOTE — Telephone Encounter (Signed)
Attempted to call patient. Patient did not answer and no voice mail to leave a message ?

## 2021-06-01 ENCOUNTER — Telehealth: Payer: Self-pay

## 2021-06-01 DIAGNOSIS — M7989 Other specified soft tissue disorders: Secondary | ICD-10-CM

## 2021-06-01 MED ORDER — METOLAZONE 2.5 MG PO TABS: ORAL_TABLET | ORAL | 0 refills | Status: DC

## 2021-06-01 MED ORDER — TORSEMIDE 100 MG PO TABS: ORAL_TABLET | ORAL | 1 refills | Status: DC

## 2021-06-01 NOTE — Telephone Encounter (Signed)
-----   Message from Park Liter, MD sent at 05/20/2021 12:06 PM EST ----- ?D-dimer abnormal.  She need to be scheduled to have ultrasounds of her lower extremities to rule out DVT, she does have significant CHF.  Please add Zaroxolyn 2.5 mg but please give her only 2 tablets she needs to take 1 tablets half an hour before she takes her Lasix and she need to do it 2 days 1 day apart ?

## 2021-06-01 NOTE — Telephone Encounter (Signed)
Patient notified of the following results, med changes and order test including details from previous messages. Including torsemide changes and lab request as well as Echo results per Dr. Agustin Cree. Patient express she understood the plan of care.  ?

## 2021-06-01 NOTE — Telephone Encounter (Signed)
-----   Message from Park Liter, MD sent at 05/27/2021  3:12 PM EST ----- ?Echocardiogram showed normal left ventricle ejection fraction, moderate to severe mitral valve regurgitation, no evidence of stenosis.  Moderately dilated left atrium, please schedule her to see me within the next 3 weeks we can talk about options ?

## 2021-06-01 NOTE — Telephone Encounter (Signed)
Patient notified of the following results, med changes and order test including details from previous messages. Including torsemide changes and lab request as well as Echo results per Dr. Agustin Cree. Patient express she understood the plan of care. Appt schedule on 06/21/2021 at 3 pm ?

## 2021-06-01 NOTE — Telephone Encounter (Signed)
Patient notified of the following med changes and lab order per Dr. Agustin Cree. Lab order on file.  ?

## 2021-06-03 DIAGNOSIS — E8729 Other acidosis: Secondary | ICD-10-CM | POA: Diagnosis not present

## 2021-06-03 DIAGNOSIS — L03114 Cellulitis of left upper limb: Secondary | ICD-10-CM | POA: Diagnosis not present

## 2021-06-03 DIAGNOSIS — I129 Hypertensive chronic kidney disease with stage 1 through stage 4 chronic kidney disease, or unspecified chronic kidney disease: Secondary | ICD-10-CM | POA: Diagnosis not present

## 2021-06-03 DIAGNOSIS — L02512 Cutaneous abscess of left hand: Secondary | ICD-10-CM | POA: Diagnosis not present

## 2021-06-03 DIAGNOSIS — N183 Chronic kidney disease, stage 3 unspecified: Secondary | ICD-10-CM | POA: Diagnosis not present

## 2021-06-03 DIAGNOSIS — M1990 Unspecified osteoarthritis, unspecified site: Secondary | ICD-10-CM | POA: Diagnosis not present

## 2021-06-09 ENCOUNTER — Other Ambulatory Visit: Payer: Self-pay

## 2021-06-09 ENCOUNTER — Ambulatory Visit (INDEPENDENT_AMBULATORY_CARE_PROVIDER_SITE_OTHER): Payer: Medicare Other

## 2021-06-09 DIAGNOSIS — I10 Essential (primary) hypertension: Secondary | ICD-10-CM | POA: Diagnosis not present

## 2021-06-09 DIAGNOSIS — R600 Localized edema: Secondary | ICD-10-CM | POA: Diagnosis not present

## 2021-06-09 DIAGNOSIS — M7989 Other specified soft tissue disorders: Secondary | ICD-10-CM | POA: Diagnosis not present

## 2021-06-10 DIAGNOSIS — L03114 Cellulitis of left upper limb: Secondary | ICD-10-CM | POA: Diagnosis not present

## 2021-06-10 DIAGNOSIS — N183 Chronic kidney disease, stage 3 unspecified: Secondary | ICD-10-CM | POA: Diagnosis not present

## 2021-06-10 DIAGNOSIS — E8729 Other acidosis: Secondary | ICD-10-CM | POA: Diagnosis not present

## 2021-06-10 DIAGNOSIS — I129 Hypertensive chronic kidney disease with stage 1 through stage 4 chronic kidney disease, or unspecified chronic kidney disease: Secondary | ICD-10-CM | POA: Diagnosis not present

## 2021-06-10 DIAGNOSIS — L02512 Cutaneous abscess of left hand: Secondary | ICD-10-CM | POA: Diagnosis not present

## 2021-06-10 DIAGNOSIS — M1990 Unspecified osteoarthritis, unspecified site: Secondary | ICD-10-CM | POA: Diagnosis not present

## 2021-06-10 LAB — BASIC METABOLIC PANEL
BUN/Creatinine Ratio: 36 — ABNORMAL HIGH (ref 12–28)
BUN: 64 mg/dL — ABNORMAL HIGH (ref 8–27)
CO2: 24 mmol/L (ref 20–29)
Calcium: 8.4 mg/dL — ABNORMAL LOW (ref 8.7–10.3)
Chloride: 97 mmol/L (ref 96–106)
Creatinine, Ser: 1.78 mg/dL — ABNORMAL HIGH (ref 0.57–1.00)
Glucose: 92 mg/dL (ref 70–99)
Potassium: 4 mmol/L (ref 3.5–5.2)
Sodium: 138 mmol/L (ref 134–144)
eGFR: 27 mL/min/{1.73_m2} — ABNORMAL LOW (ref >59)

## 2021-06-14 DIAGNOSIS — I129 Hypertensive chronic kidney disease with stage 1 through stage 4 chronic kidney disease, or unspecified chronic kidney disease: Secondary | ICD-10-CM | POA: Diagnosis not present

## 2021-06-14 DIAGNOSIS — N183 Chronic kidney disease, stage 3 unspecified: Secondary | ICD-10-CM | POA: Diagnosis not present

## 2021-06-14 DIAGNOSIS — M1990 Unspecified osteoarthritis, unspecified site: Secondary | ICD-10-CM | POA: Diagnosis not present

## 2021-06-14 DIAGNOSIS — E8729 Other acidosis: Secondary | ICD-10-CM | POA: Diagnosis not present

## 2021-06-14 DIAGNOSIS — L02512 Cutaneous abscess of left hand: Secondary | ICD-10-CM | POA: Diagnosis not present

## 2021-06-14 DIAGNOSIS — L03114 Cellulitis of left upper limb: Secondary | ICD-10-CM | POA: Diagnosis not present

## 2021-06-17 ENCOUNTER — Other Ambulatory Visit: Payer: Self-pay

## 2021-06-17 DIAGNOSIS — I10 Essential (primary) hypertension: Secondary | ICD-10-CM

## 2021-06-17 NOTE — Progress Notes (Signed)
(+)   BM

## 2021-06-21 ENCOUNTER — Encounter: Payer: Self-pay | Admitting: Cardiology

## 2021-06-21 ENCOUNTER — Ambulatory Visit (INDEPENDENT_AMBULATORY_CARE_PROVIDER_SITE_OTHER): Payer: Medicare Other | Admitting: Cardiology

## 2021-06-21 VITALS — BP 100/54 | HR 64 | Ht 60.0 in | Wt 129.2 lb

## 2021-06-21 DIAGNOSIS — I420 Dilated cardiomyopathy: Secondary | ICD-10-CM

## 2021-06-21 DIAGNOSIS — I4821 Permanent atrial fibrillation: Secondary | ICD-10-CM | POA: Diagnosis not present

## 2021-06-21 DIAGNOSIS — Z950 Presence of cardiac pacemaker: Secondary | ICD-10-CM | POA: Diagnosis not present

## 2021-06-21 DIAGNOSIS — I10 Essential (primary) hypertension: Secondary | ICD-10-CM

## 2021-06-21 NOTE — Addendum Note (Signed)
Addended by: Edwyna Shell I on: 06/21/2021 03:06 PM ? ? Modules accepted: Orders ? ?

## 2021-06-21 NOTE — Progress Notes (Signed)
?Cardiology Office Note:   ? ?Date:  06/21/2021  ? ?ID:  [name], DOB 03-28-1931, MRN [id number] ? ?PCP:  Nicoletta Dress, MD  ?Cardiologist:  Jenne Campus, MD   ? ?Referring MD: Nicoletta Dress, MD  ? ?Chief Complaint  ?Patient presents with  ? Medication Management  ? ? ?History of Present Illness:   ? ?[name] is a 88 y.o. female   with complex past medical history.  That include cardiomyopathy with normalization, last echocardiogram in November 2021 however showed ejection fraction 45%, permanent atrial fibrillation, she is refusing anticoagulation, pacemaker present which is Medtronic device, she does have congestive heart failure.  Recently she ended up having abscess in her and required surgical intervention.  Went to surgery with no difficulty and doing well thereafter. ?Concerns is swelling of lower extremities gradually getting worse.  She was switched from Lasix to torsemide 50 mg twice daily in spite of that swelling is still present much better however she described the fact that she is fatigue tiredness abdomen she gets weekly she is getting dizzy.  ?She comes today 2 months for follow-up legs are not swollen anymore.  She feels much better but complain of having weakness fatigue and tiredness symptoms of being nauseated. ? ?Past Medical History:  ?Diagnosis Date  ? A-fib ([place])   ? Anemia   ? Aortic valve disorder   ? Arthritis   ? osteoarthritis - back and knee  ? Benign hypertensive heart disease without heart failure   ? Blood transfusion   ? Body mass index (BMI) 27.0-27.9, adult 01/01/2020  ? Carpal tunnel syndrome 02/19/2018  ? Cataract   ? bilateral  ? Chest pain at rest 08/18/2014  ? Chronic atrial fibrillation ([place]) 07/12/2017  ? Chronic back pain   ? Chronic thoracic back pain 04/17/2013  ? Congestive heart failure (CHF) ([place]) 06/03/2020  ? Degeneration of lumbosacral intervertebral disc 03/31/2014  ? Dilated cardiomyopathy ([place]) 09/17/2017  ? Displacement of cervical  intervertebral disc without myelopathy 10/22/2014  ? Displacement of lumbar intervertebral disc without myelopathy 09/24/2012  ? Diverticulosis   ? Essential hypertension 10/06/2013  ? GERD (gastroesophageal reflux disease)   ? Hematoma 11/04/2013  ? History of stomach ulcers   ? Hyperlipidemia   ? Hypertension   ? Impingement syndrome of shoulder region 05/17/2014  ? Lumbar foraminal stenosis 04/19/2020  ? Neck pain 10/22/2014  ? Opioid dependence ([place]) 04/19/2020  ? Osteoarthrosis   ? Other long term (current) drug therapy 04/19/2020  ? Pacemaker   ? Pacemaker reprogramming/check 08/18/2014  ? medtronic  Device implantedSeptember 2012 by Angie Fava of this note might be different from the original. Overview:  Overview:  medtronic  Device implantedSeptember 2012 by Agustin Cree  ? Pain in upper limb 04/27/2015  ? Paroxysmal atrial fibrillation ([place]) 08/18/2014  ? Formatting of this note might be different from the original. Not anticoagulated secondary to intracranial bleed while on coumadin  ? Permanent atrial fibrillation ([place]) 02/11/2019  ? Rotator cuff tear arthropathy of right shoulder 11/16/2015  ? S/P shoulder replacement 11/16/2015  ? Shoulder impingement 05/17/2014  ? Spinal stenosis 03/31/2014  ? Status post lumbar surgery   ? Vitamin D deficiency   ? ? ?Past Surgical History:  ?Procedure Laterality Date  ? ABDOMINAL HYSTERECTOMY    ? APPENDECTOMY    ? BACK SURGERY    ? five back surgeries  ? cataract extaction -left    ? cataract extraction right    ? CHOLECYSTECTOMY    ?  COLONOSCOPY    ? CRANIOTOMY    ? ? subdural hematoma?  Sustained head injury after falling while taking Coumadin  ? ESOPHAGOGASTRODUODENOSCOPY    ? EYE SURGERY Bilateral   ? cataract surgery with lens implant  ? FRACTURE SURGERY    ? left wrist, with plate and removal of plate  ? INSERT / REPLACE / REMOVE PACEMAKER    ? Medtronic  ? KNEE ARTHROPLASTY    ? right knee  ? REVERSE SHOULDER ARTHROPLASTY Right 11/16/2015  ? REVERSE SHOULDER ARTHROPLASTY  Right 11/16/2015  ? Procedure: REVERSE SHOULDER ARTHROPLASTY;  Surgeon: Marchia Bond, MD;  Location: [facility];  Service: Orthopedics;  Laterality: Right;  ? S1 vertebroplasty    ? SHOULDER OPEN ROTATOR CUFF REPAIR  right  ? Skin cancer removal from L arm  11/02/2020  ? ? ?Current Medications: ?Current Meds  ?Medication Sig  ? aspirin EC 81 MG tablet Take 81 mg by mouth at bedtime.  ? Cholecalciferol (VITAMIN D3 PO) Take 1 tablet by mouth daily.  ? denosumab (PROLIA) 60 MG/ML SOSY injection Inject 60 mg into the skin every 6 (six) months.  ? diltiazem (CARDIZEM CD) 240 MG 24 hr capsule TAKE 1 CAPSULE BY MOUTH EVERY DAY (Patient taking differently: Take 240 mg by mouth daily.)  ? famotidine (PEPCID) 40 MG tablet Take 40 mg by mouth daily.  ? furosemide (LASIX) 40 MG tablet Take 1.5 tablets (60 mg total) by mouth daily.  ? ibuprofen (ADVIL) 800 MG tablet Take 800 mg by mouth every 8 (eight) hours as needed for mild pain or moderate pain.  ? Multiple Vitamin (MULTIVITAMIN) tablet Take 1 tablet by mouth daily. Unknown strength  ? oxyCODONE-acetaminophen (PERCOCET) 10-325 MG tablet Take 1 tablet by mouth every 6 (six) hours as needed for pain.  ? pantoprazole (PROTONIX) 40 MG tablet Take 40 mg by mouth daily.  ? torsemide (DEMADEX) 100 MG tablet Take '75mg'$  in the morning and 50 mg in the evenings. (Patient taking differently: Take 100 mg by mouth 2 (two) times daily. Take '75mg'$  in the morning and 50 mg in the evenings.)  ?  ? ?Allergies:   Amlodipine, Levofloxacin, and Penicillins  ? ?Social History  ? ?Socioeconomic History  ? Marital status: Widowed  ?  Spouse name: Not on file  ? Number of children: 4  ? Years of education: 27  ? Highest education level: Not on file  ?Occupational History  ? Occupation: retired  ?Tobacco Use  ? Smoking status: Never  ? Smokeless tobacco: Never  ?Vaping Use  ? Vaping Use: Never used  ?Substance and Sexual Activity  ? Alcohol use: No  ? Drug use: No  ? Sexual activity: Not on file  ?Other  Topics Concern  ? Not on file  ?Social History Narrative  ? Lives alone , One story  ? Right handed  ? She retired as a Regulatory affairs officer.   ? ?Social Determinants of Health  ? ?Financial Resource Strain: Not on file  ?Food Insecurity: Not on file  ?Transportation Needs: Not on file  ?Physical Activity: Not on file  ?Stress: Not on file  ?Social Connections: Not on file  ?  ? ?Family History: ?The patient's family history includes Alcoholism in her father; Diabetes in her brother, brother, brother, and mother; Throat cancer in her brother; Uterine cancer in her mother. ?ROS:   ?Please see the history of present illness.    ?All 14 point review of systems negative except as described per history of present  illness ? ?EKGs/Labs/Other Studies Reviewed:   ? ? ? ?Recent Labs: ?05/19/2021: Magnesium 1.8; NT-Pro BNP 5,610 ?06/09/2021: BUN 64; Creatinine, Ser 1.78; Potassium 4.0; Sodium 138  ?Recent Lipid Panel ?No results found for: CHOL, TRIG, HDL, CHOLHDL, VLDL, LDLCALC, LDLDIRECT ? ?Physical Exam:   ? ?VS:  BP (!) 100/54 (BP Location: Left Arm, Patient Position: Sitting)   Pulse 64   Ht 5' (1.524 m)   Wt 129 lb 3.2 oz (58.6 kg)   SpO2 95%   BMI 25.23 kg/m?    ? ?Wt Readings from Last 3 Encounters:  ?06/21/21 129 lb 3.2 oz (58.6 kg)  ?05/19/21 133 lb 12.8 oz (60.7 kg)  ?12/15/20 139 lb 3.2 oz (63.1 kg)  ?  ? ?GEN:  Well nourished, well developed in no acute distress ?HEENT: Normal ?NECK: No JVD; No carotid bruits ?LYMPHATICS: No lymphadenopathy ?CARDIAC: Irregularly irregular, holosystolic murmur grade 2/6 best heard left border of sternum no rubs, no gallops ?RESPIRATORY:  Clear to auscultation without rales, wheezing or rhonchi  ?ABDOMEN: Soft, non-tender, non-distended ?MUSCULOSKELETAL:  No edema; No deformity  ?SKIN: Warm and dry ?LOWER EXTREMITIES: no swelling ?NEUROLOGIC:  Alert and oriented x 3 ?PSYCHIATRIC:  Normal affect  ? ?ASSESSMENT:   ? ?1. Dilated cardiomyopathy ([place])   ?2. Essential hypertension   ?3. Permanent  atrial fibrillation ([place])   ?4. Pacemaker   ? ?PLAN:   ? ?In order of problems listed above: ? ?Dilated cardiomyopathy, echocardiogram showed preserved left ventricle ejection fraction with this moderate to severe

## 2021-06-21 NOTE — Patient Instructions (Signed)
Medication Instructions:  ?Your physician recommends that you continue on your current medications as directed. Please refer to the Current Medication list given to you today. ? ?*If you need a refill on your cardiac medications before your next appointment, please call your pharmacy* ? ? ?Lab Work: ?Your physician recommends that you return for lab work in:  ? ?Labs today: BMP, Pro BNP ? ?If you have labs (blood work) drawn today and your tests are completely normal, you will receive your results only by: ?MyChart Message (if you have MyChart) OR ?A paper copy in the mail ?If you have any lab test that is abnormal or we need to change your treatment, we will call you to review the results. ? ? ?Testing/Procedures: ?None ? ? ?Follow-Up: ?At [facility], you and your health needs are our priority.  As part of our continuing mission to provide you with exceptional heart care, we have created designated Provider Care Teams.  These Care Teams include your primary Cardiologist (physician) and Advanced Practice Providers (APPs -  Physician Assistants and Nurse Practitioners) who all work together to provide you with the care you need, when you need it. ? ?We recommend signing up for the patient portal called "MyChart".  Sign up information is provided on this After Visit Summary.  MyChart is used to connect with patients for Virtual Visits (Telemedicine).  Patients are able to view lab/test results, encounter notes, upcoming appointments, etc.  Non-urgent messages can be sent to your provider as well.   ?To learn more about what you can do with MyChart, go to [email or web address].   ? ?Your next appointment:   ?3 month(s) ? ?The format for your next appointment:   ?In Person ? ?Provider:   ?Jenne Campus, MD  ? ? ?Other Instructions ?None ? ?

## 2021-06-22 LAB — BASIC METABOLIC PANEL
BUN/Creatinine Ratio: 33 — ABNORMAL HIGH (ref 12–28)
BUN: 50 mg/dL — ABNORMAL HIGH (ref 8–27)
CO2: 23 mmol/L (ref 20–29)
Calcium: 8.5 mg/dL — ABNORMAL LOW (ref 8.7–10.3)
Chloride: 104 mmol/L (ref 96–106)
Creatinine, Ser: 1.51 mg/dL — ABNORMAL HIGH (ref 0.57–1.00)
Glucose: 91 mg/dL (ref 70–99)
Potassium: 3.9 mmol/L (ref 3.5–5.2)
Sodium: 141 mmol/L (ref 134–144)
eGFR: 33 mL/min/{1.73_m2} — ABNORMAL LOW (ref >59)

## 2021-06-22 LAB — PRO B NATRIURETIC PEPTIDE: NT-Pro BNP: 5943 pg/mL — ABNORMAL HIGH (ref 0–738)

## 2021-07-05 ENCOUNTER — Ambulatory Visit (INDEPENDENT_AMBULATORY_CARE_PROVIDER_SITE_OTHER): Payer: Medicare Other

## 2021-07-05 DIAGNOSIS — I420 Dilated cardiomyopathy: Secondary | ICD-10-CM | POA: Diagnosis not present

## 2021-07-06 LAB — CUP PACEART REMOTE DEVICE CHECK
Battery Impedance: 3350 Ohm
Battery Remaining Longevity: 25 mo
Battery Voltage: 2.72 V
Brady Statistic RV Percent Paced: 8 %
Date Time Interrogation Session: 20230419094402
Implantable Lead Implant Date: 20120911
Implantable Lead Implant Date: 20120911
Implantable Lead Location: 753859
Implantable Lead Location: 753860
Implantable Lead Model: 4092
Implantable Lead Model: 5076
Implantable Pulse Generator Implant Date: 20120911
Lead Channel Impedance Value: 644 Ohm
Lead Channel Impedance Value: 67 Ohm
Lead Channel Pacing Threshold Amplitude: 0.5 V
Lead Channel Pacing Threshold Pulse Width: 0.4 ms
Lead Channel Setting Pacing Amplitude: 2 V
Lead Channel Setting Pacing Pulse Width: 0.4 ms
Lead Channel Setting Sensing Sensitivity: 5.6 mV

## 2021-07-22 NOTE — Progress Notes (Signed)
Remote pacemaker transmission.   

## 2021-07-25 DIAGNOSIS — M1712 Unilateral primary osteoarthritis, left knee: Secondary | ICD-10-CM | POA: Diagnosis not present

## 2021-07-28 ENCOUNTER — Telehealth: Payer: Self-pay

## 2021-07-28 DIAGNOSIS — M1712 Unilateral primary osteoarthritis, left knee: Secondary | ICD-10-CM | POA: Diagnosis not present

## 2021-07-28 NOTE — Telephone Encounter (Signed)
? ?  Primary Cardiologist: Jenne Campus, MD ? ?Chart reviewed as part of pre-operative protocol coverage. Given past medical history and time since last visit, based on ACC/AHA guidelines, [name] would be at acceptable risk for the planned procedure without further cardiovascular testing.  ? ?Her RCRI is a class II risk, 0.9% risk of major cardiac event. ? ?I will route this recommendation to the requesting party via Epic fax function and remove from pre-op pool. ? ? ? ?Please call with questions. ? ?Jossie Ng. Keighley Deckman NP-C ? ?  ?07/28/2021, 4:44 PM ?[place] ?[address] ?Office [phone] Fax [phone] ? ? ? ? ?

## 2021-07-28 NOTE — Telephone Encounter (Signed)
? ?  Pre-operative Risk Assessment  ?  ?Patient Name: [name]  ?DOB: 06-02-1931 ?MRN: [id number]  ? ?  ? ?Request for Surgical Clearance   ? ?Procedure:   left total knee arthroplasty ? ?Date of Surgery:  Clearance TBD                              ?   ?Surgeon:  Charlies Constable, MD ?Surgeon's Group or Practice Name:  [facility] ?Phone number:  [phone]  [phone] ?Fax number:  [phone] ?  ?Type of Clearance Requested:   ?- Medical  ?  ?Type of Anesthesia:  Spinal ?  ?Additional requests/questions:   ? ?Signed, ?Francoise Chojnowski Tressa Busman   ?07/28/2021, 4:16 PM  ? ?

## 2021-08-04 ENCOUNTER — Other Ambulatory Visit: Payer: Self-pay | Admitting: Cardiology

## 2021-08-11 DIAGNOSIS — I10 Essential (primary) hypertension: Secondary | ICD-10-CM | POA: Diagnosis not present

## 2021-08-11 DIAGNOSIS — D500 Iron deficiency anemia secondary to blood loss (chronic): Secondary | ICD-10-CM | POA: Diagnosis not present

## 2021-08-11 DIAGNOSIS — Z01818 Encounter for other preprocedural examination: Secondary | ICD-10-CM | POA: Diagnosis not present

## 2021-08-11 DIAGNOSIS — I480 Paroxysmal atrial fibrillation: Secondary | ICD-10-CM | POA: Diagnosis not present

## 2021-08-11 DIAGNOSIS — R600 Localized edema: Secondary | ICD-10-CM | POA: Diagnosis not present

## 2021-08-11 DIAGNOSIS — E785 Hyperlipidemia, unspecified: Secondary | ICD-10-CM | POA: Diagnosis not present

## 2021-08-12 DIAGNOSIS — D638 Anemia in other chronic diseases classified elsewhere: Secondary | ICD-10-CM | POA: Diagnosis not present

## 2021-08-12 DIAGNOSIS — E785 Hyperlipidemia, unspecified: Secondary | ICD-10-CM | POA: Diagnosis not present

## 2021-08-12 NOTE — H&P (Signed)
KNEE ARTHROPLASTY ADMISSION H&P  Patient ID: [name] MRN: [id number] DOB/AGE: July 01, 1931 88 y.o.  Chief Complaint: {Left/right:3049041} knee pain.  Planned Procedure Date: *** Medical Clearance by ***   Cardiac Clearance by *** Additional clearance by ***   HPI: [name] is a 88 y.o. female who presents for evaluation of OA LEFT KNEE. The patient has a history of pain and functional disability in the {Left/right:3049041} knee due to arthritis and has failed non-surgical conservative treatments for greater than 12 weeks to include {nonsurgical conservative treatment (must select two):3049030}.  Onset of symptoms was {abrupt, gradual:20671}, starting {1->10 years:3049031} years ago with {stable, gradual worsening, rapidly worsening:3049032} course since that time. The patient noted {no past surgery, prior procedures:3049033} on the {Left/right:3049041} knee.  Patient currently rates pain at {1-10:3049035} out of 10 with activity. Patient has {night pain, worsening of pain with activity and weight bearing:3049036}.  Patient has evidence of {Radiographic or MRI evidence of (must document one of the below):3046104} by imaging studies.  There is no active infection.  Past Medical History:  Diagnosis Date   A-fib ([place])    Anemia    Aortic valve disorder    Arthritis    osteoarthritis - back and knee   Benign hypertensive heart disease without heart failure    Blood transfusion    Body mass index (BMI) 27.0-27.9, adult 01/01/2020   Carpal tunnel syndrome 02/19/2018   Cataract    bilateral   Chest pain at rest 08/18/2014   Chronic atrial fibrillation ([place]) 07/12/2017   Chronic back pain    Chronic thoracic back pain 04/17/2013   Congestive heart failure (CHF) ([place]) 06/03/2020   Degeneration of lumbosacral intervertebral disc 03/31/2014   Dilated cardiomyopathy ([place]) 09/17/2017   Displacement of cervical intervertebral disc without myelopathy 10/22/2014   Displacement of lumbar  intervertebral disc without myelopathy 09/24/2012   Diverticulosis    Essential hypertension 10/06/2013   GERD (gastroesophageal reflux disease)    Hematoma 11/04/2013   History of stomach ulcers    Hyperlipidemia    Hypertension    Impingement syndrome of shoulder region 05/17/2014   Lumbar foraminal stenosis 04/19/2020   Neck pain 10/22/2014   Opioid dependence ([place]) 04/19/2020   Osteoarthrosis    Other long term (current) drug therapy 04/19/2020   Pacemaker    Pacemaker reprogramming/check 08/18/2014   medtronic  Device implantedSeptember 2012 by Angie Fava of this note might be different from the original. Overview:  Overview:  medtronic  Device implantedSeptember 2012 by Agustin Cree   Pain in upper limb 04/27/2015   Paroxysmal atrial fibrillation ([place]) 08/18/2014   Formatting of this note might be different from the original. Not anticoagulated secondary to intracranial bleed while on coumadin   Permanent atrial fibrillation ([place]) 02/11/2019   Rotator cuff tear arthropathy of right shoulder 11/16/2015   S/P shoulder replacement 11/16/2015   Shoulder impingement 05/17/2014   Spinal stenosis 03/31/2014   Status post lumbar surgery    Vitamin D deficiency    Past Surgical History:  Procedure Laterality Date   ABDOMINAL HYSTERECTOMY     APPENDECTOMY     BACK SURGERY     five back surgeries   cataract extaction -left     cataract extraction right     CHOLECYSTECTOMY     COLONOSCOPY     CRANIOTOMY     ? subdural hematoma?  Sustained head injury after falling while taking Coumadin   ESOPHAGOGASTRODUODENOSCOPY     EYE SURGERY Bilateral  cataract surgery with lens implant   FRACTURE SURGERY     left wrist, with plate and removal of plate   INSERT / REPLACE / REMOVE PACEMAKER     Medtronic   KNEE ARTHROPLASTY     right knee   REVERSE SHOULDER ARTHROPLASTY Right 11/16/2015   REVERSE SHOULDER ARTHROPLASTY Right 11/16/2015   Procedure: REVERSE SHOULDER ARTHROPLASTY;  Surgeon:  Marchia Bond, MD;  Location: [facility];  Service: Orthopedics;  Laterality: Right;   S1 vertebroplasty     SHOULDER OPEN ROTATOR CUFF REPAIR  right   Skin cancer removal from L arm  11/02/2020   Allergies  Allergen Reactions   Amlodipine Anaphylaxis    Swelling of throat with Amlodipine; but does tolerate Tribenzor   Levofloxacin Anaphylaxis   Penicillins Hives   Prior to Admission medications   Medication Sig Start Date End Date Taking? Authorizing Provider  aspirin EC 81 MG tablet Take 81 mg by mouth at bedtime.    [provider]  Cholecalciferol (VITAMIN D3 PO) Take 1 tablet by mouth daily.    [provider]  denosumab (PROLIA) 60 MG/ML SOSY injection Inject 60 mg into the skin every 6 (six) months.    [provider]  diltiazem (CARDIZEM CD) 240 MG 24 hr capsule TAKE 1 CAPSULE BY MOUTH EVERY DAY Patient taking differently: Take 240 mg by mouth daily. 01/31/21   Park Liter, MD  famotidine (PEPCID) 40 MG tablet Take 40 mg by mouth daily. 01/16/17   [provider]  furosemide (LASIX) 40 MG tablet Take 1.5 tablets (60 mg total) by mouth daily. 06/03/20   Park Liter, MD  ibuprofen (ADVIL) 800 MG tablet Take 800 mg by mouth every 8 (eight) hours as needed for mild pain or moderate pain.    [provider]  Multiple Vitamin (MULTIVITAMIN) tablet Take 1 tablet by mouth daily. Unknown strength    [provider]  oxyCODONE-acetaminophen (PERCOCET) 10-325 MG tablet Take 1 tablet by mouth every 6 (six) hours as needed for pain. 05/31/20   [provider]  pantoprazole (PROTONIX) 40 MG tablet Take 40 mg by mouth daily. 01/16/17   [provider]  torsemide (DEMADEX) 100 MG tablet TAKE '75MG'$  IN THE MORNING AND 50 MG IN THE EVENINGS. 08/04/21   Park Liter, MD   Social History   Socioeconomic History   Marital status: Widowed    Spouse name: Not on file   Number of children: 4   Years of education: 66    Highest education level: Not on file  Occupational History   Occupation: retired  Tobacco Use   Smoking status: Never   Smokeless tobacco: Never  Vaping Use   Vaping Use: Never used  Substance and Sexual Activity   Alcohol use: No   Drug use: No   Sexual activity: Not on file  Other Topics Concern   Not on file  Social History Narrative   Lives alone , One story   Right handed   She retired as a Regulatory affairs officer.    Social Determinants of Health   Financial Resource Strain: Not on file  Food Insecurity: Not on file  Transportation Needs: Not on file  Physical Activity: Not on file  Stress: Not on file  Social Connections: Not on file   Family History  Problem Relation Age of Onset   Uterine cancer Mother    Diabetes Mother    Alcoholism Father    Diabetes Brother    Throat  cancer Brother    Diabetes Brother    Diabetes Brother     ROS: Currently denies lightheadedness, dizziness, Fever, chills, CP, SOB.***   No personal history of DVT, PE, MI, or CVA.*** No loose teeth or dentures*** All other systems have been reviewed and were otherwise currently negative with the exception of those mentioned in the HPI and as above.  Objective: Vitals: Ht: *** Wt: *** lbs Temp: *** BP: *** Pulse: *** O2 ***% on room air.   Physical Exam: General: Alert, NAD.  Antalgic Gait *** HEENT: EOMI, Good Neck Extension *** Pulm: No increased work of breathing.  Clear B/L A/P w/o crackle or wheeze. *** CV: RRR, No m/g/r appreciated *** GI: soft, NT, ND. BS x 4 quadrants Neuro: CN II-XII grossly intact without focal deficit.  Sensation intact distally Skin: No lesions in the area of chief complaint MSK/Surgical Site: *** knee w/o redness or effusion.  *** JLT. ROM ***.  5/5 strength in extension and flexion.  +EHL/FHL.  NVI.  Stable varus and valgus stress.    Imaging Review Plain radiographs demonstrate {mild/mod/severe:3049053} degenerative joint disease of the {left/right/bi:30031}  knee.   The overall alignment is{Neutral/varus:3049054}. The bone quality appears to be {good/fair/poor/excellent:33178} for age and reported activity level.  Preoperative templating of the joint replacement has been completed, documented, and submitted to the Operating Room personnel in order to optimize intra-operative equipment management.  Assessment: OA LEFT KNEE Active Problems:   * No active hospital problems. *   Plan: Plan for Procedure(s): TOTAL KNEE ARTHROPLASTY  The patient history, physical exam, clinical judgement of the provider and imaging are consistent with end stage degenerative joint disease and *** joint arthroplasty is deemed medically necessary. The treatment options including medical management, injection therapy, and arthroplasty were discussed at length. The risks and benefits of Procedure(s): TOTAL KNEE ARTHROPLASTY were presented and reviewed.  The risks of nonoperative treatment, versus surgical intervention including but not limited to continued pain, aseptic loosening, stiffness, dislocation/subluxation, infection, bleeding, nerve injury, blood clots, cardiopulmonary complications, morbidity, mortality, among others were discussed. The patient verbalizes understanding and wishes to proceed with the plan.  Patient is being admitted for inpatient treatment for surgery, pain control, PT, prophylactic antibiotics, VTE prophylaxis, progressive ambulation, ADL's and discharge planning.   Dental prophylaxis discussed and recommended for 2 years postoperatively.***  The patient does not*** meet the criteria for TXA which will be used perioperatively.   ASA 81 mg BID ***ASA 325 mg ***Xarelto  will be used postoperatively for DVT prophylaxis in addition to SCDs, and early ambulation. Plan for ***Tylenol, Celebrex, oxycodone for pain.   Robaxin***Baclofen for muscle spasms.   Zofran for nausea and vomiting. Omeprazole for gastric protection***. Pharmacy- *** The  patient is planning to be discharged home with OPPT*** HHPT*** ([facility]***) and into the care of *** who can be reached at *** Follow up appt ***  {ORTHOADMISSIONSTATUS:21269}    Alisa Graff Office [phone] 08/12/2021 4:54 PM

## 2021-08-17 DIAGNOSIS — M1712 Unilateral primary osteoarthritis, left knee: Secondary | ICD-10-CM | POA: Diagnosis not present

## 2021-08-19 NOTE — Progress Notes (Addendum)
COVID Vaccine Completed:  Yes  Date of COVID positive in last 90 days:  No  PCP - Foye Deer, MD Cardiologist - Gypsy Balsam, MD  Cardiac clearance in Epic dated 07-28-21 by Edd Fabian, NP-C  Chest x-ray - N/A EKG - 05-19-21 Epic Stress Test - greater than 2 years ECHO - 05-26-2021 Cardiac Cath - N/A Pacemaker/ICD device last checked: 08-23-21 (device orders on chart) Spinal Cord Stimulator:  Bowel Prep -  N/A  Sleep Study - N/A CPAP -   Fasting Blood Sugar - N/A Checks Blood Sugar _____ times a day  Blood Thinner Instructions: Aspirin Instructions: ASA 81 mg.  To stop a week prior per patient Last Dose:  Activity level:   Unable to climb stairs due to knee pain.  Can  perform activities of daily living without stopping and without symptoms of chest pain or shortness of breath.  Patient lives alone    Anesthesia review: A.fib, PPM, CAD, CHF,  Patient denies shortness of breath, fever, cough and chest pain at PAT appointment  Patient verbalized understanding of instructions that were given to them at the PAT appointment. Patient was also instructed that they will need to review over the PAT instructions again at home before surgery.

## 2021-08-19 NOTE — Patient Instructions (Addendum)
DUE TO COVID-19 ONLY TWO VISITORS  (aged 86 and older)  IS ALLOWED TO COME WITH YOU AND STAY IN THE WAITING ROOM ONLY DURING PRE OP AND PROCEDURE.   **NO VISITORS ARE ALLOWED IN THE SHORT STAY AREA OR RECOVERY ROOM!!**  IF YOU WILL BE ADMITTED INTO THE HOSPITAL YOU ARE ALLOWED ONLY FOUR SUPPORT PEOPLE DURING VISITATION HOURS ONLY (7 AM -8PM)   The support person(s) must pass our screening, gel in and out Visitors GUEST BADGE MUST BE WORN VISIBLY  One adult visitor may remain with you overnight and MUST be in the room by 8 P.M.   You are not required to [employer] often Do NOT share personal items Notify your provider if you are in close contact with someone who has COVID or you develop fever 100.4 or greater, new onset of sneezing, cough, sore throat, shortness of breath or body aches.        Your procedure is scheduled on:  August 31, 2021   Report to [facility] Main Entrance    Report to admitting at 8:45 AM   Call this number if you have problems the morning of surgery [phone]   Do not eat food :After Midnight.   After Midnight you may have the following liquids until 8:30 AM DAY OF SURGERY  Clear Liquid Diet Water Black Coffee (sugar ok, NO MILK/CREAM OR CREAMERS)  Tea (sugar ok, NO MILK/CREAM OR CREAMERS) regular and decaf                             Plain Jell-O (NO RED)                                           Fruit ices (not with fruit pulp, NO RED)                                     Popsicles (NO RED)                                                                  Juice: apple, WHITE grape, WHITE cranberry Sports drinks like Gatorade (NO RED) Clear broth(vegetable,chicken,beef)                 The day of surgery:  Drink ONE (1) Pre-Surgery Clear Ensure at 8:30 AM the morning of surgery. Drink in one sitting. Do not sip.  This drink was given to you during your hospital  pre-op appointment visit. Nothing else to drink after completing  the Pre-Surgery Clear Ensure          If you have questions, please contact your surgeon's office.   FOLLOW ANY ADDITIONAL PRE OP INSTRUCTIONS YOU RECEIVED FROM YOUR SURGEON'S OFFICE!!!     Oral Hygiene is also important to reduce your risk of infection.                                    Remember -  BRUSH YOUR TEETH THE MORNING OF SURGERY WITH YOUR REGULAR TOOTHPASTE   Do NOT smoke after Midnight   Take these medicines the morning of surgery with A SIP OF WATER: Diltiazem, Famotidine, Oxycodone if needed             You may not have any metal on your body including hair pins, jewelry, and body piercing             Do not wear make-up, lotions, powders, perfumes, or deodorant  Do not wear nail polish including gel and S&S, artificial/acrylic nails, or any other type of covering on natural nails including finger and toenails. If you have artificial nails, gel coating, etc. that needs to be removed by a nail salon please have this removed prior to surgery or surgery may need to be canceled/ delayed if the surgeon/ anesthesia feels like they are unable to be safely monitored.   Do not shave  48 hours prior to surgery.               Do not bring valuables to the hospital. [place].   Contacts, dentures or bridgework may not be worn into surgery.   Bring small overnight bag day of surgery.   Special Instructions: Bring a copy of your healthcare power of attorney and living will documents the day of surgery if you haven't scanned them before.  Please read over the following fact sheets you were given: IF YOU HAVE QUESTIONS ABOUT YOUR PRE-OP INSTRUCTIONS PLEASE CALL [facility] - Preparing for Surgery Before surgery, you can play an important role.  Because skin is not sterile, your skin needs to be as free of germs as possible.  You can reduce the number of germs on your skin by washing with CHG (chlorahexidine gluconate) soap before  surgery.  CHG is an antiseptic cleaner which kills germs and bonds with the skin to continue killing germs even after washing. Please DO NOT use if you have an allergy to CHG or antibacterial soaps.  If your skin becomes reddened/irritated stop using the CHG and inform your nurse when you arrive at Short Stay. Do not shave (including legs and underarms) for at least 48 hours prior to the first CHG shower.  You may shave your face/neck.  Please follow these instructions carefully:  1.  Shower with CHG Soap the night before surgery and the  morning of surgery.  2.  If you choose to wash your hair, wash your hair first as usual with your normal  shampoo.  3.  After you shampoo, rinse your hair and body thoroughly to remove the shampoo.                             4.  Use CHG as you would any other liquid soap.  You can apply chg directly to the skin and wash.  Gently with a scrungie or clean washcloth.  5.  Apply the CHG Soap to your body ONLY FROM THE NECK DOWN.   Do   not use on face/ open                           Wound or open sores. Avoid contact with eyes, ears mouth and   genitals (private parts).  Wash face,  Genitals (private parts) with your normal soap.             6.  Wash thoroughly, paying special attention to the area where your    surgery  will be performed.  7.  Thoroughly rinse your body with warm water from the neck down.  8.  DO NOT shower/wash with your normal soap after using and rinsing off the CHG Soap.                9.  Pat yourself dry with a clean towel.            10.  Wear clean pajamas.            11.  Place clean sheets on your bed the night of your first shower and do not  sleep with pets. Day of Surgery : Do not apply any lotions/deodorants the morning of surgery.  Please wear clean clothes to the hospital/surgery center.  FAILURE TO FOLLOW THESE INSTRUCTIONS MAY RESULT IN THE CANCELLATION OF YOUR SURGERY  PATIENT  SIGNATURE_________________________________  NURSE SIGNATURE__________________________________  ________________________________________________________________________     Adam Phenix  An incentive spirometer is a tool that can help keep your lungs clear and active. This tool measures how well you are filling your lungs with each breath. Taking long deep breaths may help reverse or decrease the chance of developing breathing (pulmonary) problems (especially infection) following: A long period of time when you are unable to move or be active. BEFORE THE PROCEDURE  If the spirometer includes an indicator to show your best effort, your nurse or respiratory therapist will set it to a desired goal. If possible, sit up straight or lean slightly forward. Try not to slouch. Hold the incentive spirometer in an upright position. INSTRUCTIONS FOR USE  Sit on the edge of your bed if possible, or sit up as far as you can in bed or on a chair. Hold the incentive spirometer in an upright position. Breathe out normally. Place the mouthpiece in your mouth and seal your lips tightly around it. Breathe in slowly and as deeply as possible, raising the piston or the ball toward the top of the column. Hold your breath for 3-5 seconds or for as long as possible. Allow the piston or ball to fall to the bottom of the column. Remove the mouthpiece from your mouth and breathe out normally. Rest for a few seconds and repeat Steps 1 through 7 at least 10 times every 1-2 hours when you are awake. Take your time and take a few normal breaths between deep breaths. The spirometer may include an indicator to show your best effort. Use the indicator as a goal to work toward during each repetition. After each set of 10 deep breaths, practice coughing to be sure your lungs are clear. If you have an incision (the cut made at the time of surgery), support your incision when coughing by placing a pillow or rolled up  towels firmly against it. Once you are able to get out of bed, walk around indoors and cough well. You may stop using the incentive spirometer when instructed by your caregiver.  RISKS AND COMPLICATIONS Take your time so you do not get dizzy or light-headed. If you are in pain, you may need to take or ask for pain medication before doing incentive spirometry. It is harder to take a deep breath if you are having pain. AFTER USE Rest and breathe slowly and easily. It can  be helpful to keep track of a log of your progress. Your caregiver can provide you with a simple table to help with this. If you are using the spirometer at home, follow these instructions: [place] IF:  You are having difficultly using the spirometer. You have trouble using the spirometer as often as instructed. Your pain medication is not giving enough relief while using the spirometer. You develop fever of 100.5 F (38.1 C) or higher. SEEK IMMEDIATE MEDICAL CARE IF:  You cough up bloody sputum that had not been present before. You develop fever of 102 F (38.9 C) or greater. You develop worsening pain at or near the incision site. MAKE SURE YOU:  Understand these instructions. Will watch your condition. Will get help right away if you are not doing well or get worse. Document Released: 07/17/2006 Document Revised: 05/29/2011 Document Reviewed: 09/17/2006 [facility] Patient Information 2014 [place], [place].   ________________________________________________________________________

## 2021-08-23 ENCOUNTER — Encounter (HOSPITAL_COMMUNITY)
Admission: RE | Admit: 2021-08-23 | Discharge: 2021-08-23 | Disposition: A | Discharge status: Home / Self Care | Payer: Medicare Other | Source: Ambulatory Visit | Attending: Orthopedic Surgery | Admitting: Orthopedic Surgery

## 2021-08-23 ENCOUNTER — Encounter (HOSPITAL_COMMUNITY): Payer: Self-pay

## 2021-08-23 ENCOUNTER — Encounter: Payer: Self-pay | Admitting: Student

## 2021-08-23 ENCOUNTER — Ambulatory Visit (INDEPENDENT_AMBULATORY_CARE_PROVIDER_SITE_OTHER): Payer: Medicare Other | Admitting: Student

## 2021-08-23 ENCOUNTER — Other Ambulatory Visit: Payer: Self-pay

## 2021-08-23 VITALS — BP 136/78 | HR 108 | Ht 60.0 in | Wt 133.2 lb

## 2021-08-23 VITALS — BP 139/54 | HR 97 | Temp 98.2°F | Resp 20 | Ht 60.0 in | Wt 130.4 lb

## 2021-08-23 DIAGNOSIS — I2510 Atherosclerotic heart disease of native coronary artery without angina pectoris: Secondary | ICD-10-CM

## 2021-08-23 DIAGNOSIS — I4821 Permanent atrial fibrillation: Secondary | ICD-10-CM

## 2021-08-23 DIAGNOSIS — I420 Dilated cardiomyopathy: Secondary | ICD-10-CM

## 2021-08-23 DIAGNOSIS — Z01812 Encounter for preprocedural laboratory examination: Secondary | ICD-10-CM | POA: Diagnosis not present

## 2021-08-23 DIAGNOSIS — D649 Anemia, unspecified: Secondary | ICD-10-CM | POA: Diagnosis not present

## 2021-08-23 DIAGNOSIS — Z01818 Encounter for other preprocedural examination: Secondary | ICD-10-CM

## 2021-08-23 HISTORY — DX: Atherosclerotic heart disease of native coronary artery without angina pectoris: I25.10

## 2021-08-23 HISTORY — DX: Unspecified malignant neoplasm of skin of unspecified upper limb, including shoulder: C44.601

## 2021-08-23 HISTORY — DX: Pneumonia, unspecified organism: J18.9

## 2021-08-23 LAB — CBC
HCT: 34.6 % — ABNORMAL LOW (ref 36.0–46.0)
Hemoglobin: 11 g/dL — ABNORMAL LOW (ref 12.0–15.0)
MCH: 34.1 pg — ABNORMAL HIGH (ref 26.0–34.0)
MCHC: 31.8 g/dL (ref 30.0–36.0)
MCV: 107.1 fL — ABNORMAL HIGH (ref 80.0–100.0)
Platelets: 296 10*3/uL (ref 150–400)
RBC: 3.23 MIL/uL — ABNORMAL LOW (ref 3.87–5.11)
RDW: 14.6 % (ref 11.5–15.5)
WBC: 6.3 10*3/uL (ref 4.0–10.5)
nRBC: 0 % (ref 0.0–0.2)

## 2021-08-23 LAB — CUP PACEART INCLINIC DEVICE CHECK
Battery Impedance: 3485 Ohm
Battery Remaining Longevity: 24 mo
Battery Voltage: 2.71 V
Brady Statistic RV Percent Paced: 7 %
Date Time Interrogation Session: 20230606104306
Implantable Lead Implant Date: 20120911
Implantable Lead Implant Date: 20120911
Implantable Lead Location: 753859
Implantable Lead Location: 753860
Implantable Lead Model: 4092
Implantable Lead Model: 5076
Implantable Pulse Generator Implant Date: 20120911
Lead Channel Impedance Value: 646 Ohm
Lead Channel Impedance Value: 67 Ohm
Lead Channel Pacing Threshold Amplitude: 0.5 V
Lead Channel Pacing Threshold Amplitude: 0.5 V
Lead Channel Pacing Threshold Pulse Width: 0.4 ms
Lead Channel Pacing Threshold Pulse Width: 0.4 ms
Lead Channel Sensing Intrinsic Amplitude: 22.4 mV
Lead Channel Setting Pacing Amplitude: 2 V
Lead Channel Setting Pacing Pulse Width: 0.4 ms
Lead Channel Setting Sensing Sensitivity: 5.6 mV

## 2021-08-23 LAB — BASIC METABOLIC PANEL
Anion gap: 10 (ref 5–15)
BUN: 32 mg/dL — ABNORMAL HIGH (ref 8–23)
CO2: 25 mmol/L (ref 22–32)
Calcium: 8.6 mg/dL — ABNORMAL LOW (ref 8.9–10.3)
Chloride: 109 mmol/L (ref 98–111)
Creatinine, Ser: 1 mg/dL (ref 0.44–1.00)
GFR, Estimated: 54 mL/min — ABNORMAL LOW (ref >60)
Glucose, Bld: 101 mg/dL — ABNORMAL HIGH (ref 70–99)
Potassium: 3.5 mmol/L (ref 3.5–5.1)
Sodium: 144 mmol/L (ref 135–145)

## 2021-08-23 LAB — SURGICAL PCR SCREEN
MRSA, PCR: NEGATIVE
Staphylococcus aureus: NEGATIVE

## 2021-08-23 NOTE — Care Plan (Signed)
Ortho Bundle Case Management Note  Patient Details  Name: [name] MRN: [id number] Date of Birth: [birth year]  Met with patient in the office prior to surgery. She will discharge to home with family to assist. Has equipment at home. HHPT referral to [facility] and OPPT set up with [facility]. Patient and MD in agreement with plan. Choice offered                     DME Arranged:    DME Agency:     HH Arranged:  PT HH Agency:  [facility]  Additional Comments: Please contact me with any questions of if this plan should need to change.  Ladell Heads,  [facility] Specialist  [phone] 08/23/2021, 12:51 PM

## 2021-08-23 NOTE — Progress Notes (Signed)
Electrophysiology Office Note Date: 08/23/2021  ID:  Mae, Denunzio Sep 10, 1931, MRN [id number]  PCP: Nicoletta Dress, MD Primary Cardiologist: Jenne Campus, MD Electrophysiologist: Constance Haw, MD   CC: Pacemaker follow-up  KAILYNNE FERRINGTON is a 88 y.o. female seen today for Will Meredith Leeds, MD for routine electrophysiology followup.  Since last being seen in our clinic the patient reports doing very well.  she denies chest pain, palpitations, dyspnea, PND, orthopnea, nausea, vomiting, dizziness, syncope, edema, weight gain, or early satiety.  Device History: Medtronic Dual Chamber PPM implanted 2012 for ?tachy brady (diagnosis listed as A fib)  Past Medical History:  Diagnosis Date   A-fib ([place])    Anemia    Aortic valve disorder    Arthritis    osteoarthritis - back and knee   Benign hypertensive heart disease without heart failure    Blood transfusion    Body mass index (BMI) 27.0-27.9, adult 01/01/2020   Carpal tunnel syndrome 02/19/2018   Cataract    bilateral   Chest pain at rest 08/18/2014   Chronic atrial fibrillation ([place]) 07/12/2017   Chronic back pain    Chronic thoracic back pain 04/17/2013   Congestive heart failure (CHF) ([place]) 06/03/2020   Coronary artery disease    Degeneration of lumbosacral intervertebral disc 03/31/2014   Dilated cardiomyopathy ([place]) 09/17/2017   Displacement of cervical intervertebral disc without myelopathy 10/22/2014   Displacement of lumbar intervertebral disc without myelopathy 09/24/2012   Diverticulosis    Essential hypertension 10/06/2013   GERD (gastroesophageal reflux disease)    Hematoma 11/04/2013   History of stomach ulcers    Hyperlipidemia    Hypertension    Impingement syndrome of shoulder region 05/17/2014   Lumbar foraminal stenosis 04/19/2020   Neck pain 10/22/2014   Opioid dependence ([place]) 04/19/2020   Osteoarthrosis    Other long term (current) drug therapy 04/19/2020   Pacemaker     Pacemaker reprogramming/check 08/18/2014   medtronic  Device implantedSeptember 2012 by Angie Fava of this note might be different from the original. Overview:  Overview:  medtronic  Device implantedSeptember 2012 by Agustin Cree   Pain in upper limb 04/27/2015   Paroxysmal atrial fibrillation ([place]) 08/18/2014   Formatting of this note might be different from the original. Not anticoagulated secondary to intracranial bleed while on coumadin   Permanent atrial fibrillation ([place]) 02/11/2019   Pneumonia    Rotator cuff tear arthropathy of right shoulder 11/16/2015   S/P shoulder replacement 11/16/2015   Shoulder impingement 05/17/2014   Skin cancer of arm    L arm   Spinal stenosis 03/31/2014   Status post lumbar surgery    Vitamin D deficiency    Past Surgical History:  Procedure Laterality Date   ABDOMINAL HYSTERECTOMY     APPENDECTOMY     BACK SURGERY     five back surgeries   cataract extaction -left     cataract extraction right     CHOLECYSTECTOMY     COLONOSCOPY     CRANIOTOMY     ? subdural hematoma?  Sustained head injury after falling while taking Coumadin   ESOPHAGOGASTRODUODENOSCOPY     EYE SURGERY Bilateral    cataract surgery with lens implant   FRACTURE SURGERY     left wrist, with plate and removal of plate   INSERT / REPLACE / REMOVE PACEMAKER     Medtronic   KNEE ARTHROPLASTY     right knee   REVERSE SHOULDER ARTHROPLASTY Right  11/16/2015   REVERSE SHOULDER ARTHROPLASTY Right 11/16/2015   Procedure: REVERSE SHOULDER ARTHROPLASTY;  Surgeon: Marchia Bond, MD;  Location: [place];  Service: Orthopedics;  Laterality: Right;   S1 vertebroplasty     SHOULDER OPEN ROTATOR CUFF REPAIR  right   Skin cancer removal from L arm  11/02/2020    Current Outpatient Medications  Medication Sig Dispense Refill   acetaminophen (TYLENOL) 325 MG tablet Take 650 mg by mouth every 6 (six) hours as needed (for pain).     aspirin EC 81 MG tablet Take 81 mg by mouth at  bedtime.     CALCIUM PO Take 1 tablet by mouth in the morning.     cholecalciferol (VITAMIN D) 25 MCG (1000 UNIT) tablet Take 1,000 Units by mouth in the morning.     denosumab (PROLIA) 60 MG/ML SOSY injection Inject 60 mg into the skin every 6 (six) months.     diltiazem (CARDIZEM CD) 240 MG 24 hr capsule TAKE 1 CAPSULE BY MOUTH EVERY DAY 90 capsule 3   famotidine (PEPCID) 40 MG tablet Take 40 mg by mouth in the morning.  0   ibuprofen (ADVIL) 800 MG tablet Take 800 mg by mouth every 8 (eight) hours as needed for mild pain or moderate pain.     Iodine, Kelp, (KELP PO) Take 1 tablet by mouth in the morning. Sea Kelp (OTC)     Multiple Vitamin (MULTIVITAMIN) tablet Take 1 tablet by mouth daily.     oxyCODONE-acetaminophen (PERCOCET) 10-325 MG tablet Take 1 tablet by mouth every 6 (six) hours as needed for pain.     pantoprazole (PROTONIX) 40 MG tablet Take 40 mg by mouth every evening.  0   Potassium 99 MG TABS Take 99 mg by mouth in the morning.     torsemide (DEMADEX) 100 MG tablet TAKE '75MG'$  IN THE MORNING AND 50 MG IN THE EVENINGS. (Patient taking differently: Take 50 mg by mouth in the morning and at bedtime.) 38 tablet 4   valsartan (DIOVAN) 160 MG tablet Take 160 mg by mouth in the morning.     vitamin B-12 (CYANOCOBALAMIN) 1000 MCG tablet Take 1,000 mcg by mouth in the morning.     VITAMIN E PO Take 1 capsule by mouth in the morning.     No current facility-administered medications for this visit.    Allergies:   Levaquin [levofloxacin], Norvasc [amlodipine], and Penicillins   Social History: Social History   Socioeconomic History   Marital status: Widowed    Spouse name: Not on file   Number of children: 4   Years of education: 36   Highest education level: Not on file  Occupational History   Occupation: retired  Tobacco Use   Smoking status: Never   Smokeless tobacco: Never  Vaping Use   Vaping Use: Never used  Substance and Sexual Activity   Alcohol use: No   Drug  use: No   Sexual activity: Not on file  Other Topics Concern   Not on file  Social History Narrative   Lives alone , One story   Right handed   She retired as a Regulatory affairs officer.    Social Determinants of Health   Financial Resource Strain: Not on file  Food Insecurity: Not on file  Transportation Needs: Not on file  Physical Activity: Not on file  Stress: Not on file  Social Connections: Not on file  Intimate Partner Violence: Not on file    Family History: Family History  Problem  Relation Age of Onset   Uterine cancer Mother    Diabetes Mother    Alcoholism Father    Diabetes Brother    Throat cancer Brother    Diabetes Brother    Diabetes Brother      Review of Systems: All other systems reviewed and are otherwise negative except as noted above.  Physical Exam: There were no vitals filed for this visit.   GEN- The patient is well appearing, alert and oriented x 3 today.   HEENT: normocephalic, atraumatic; sclera clear, conjunctiva pink; hearing intact; oropharynx clear; neck supple  Lungs- Clear to ausculation bilaterally, normal work of breathing.  No wheezes, rales, rhonchi Heart- Regular rate and rhythm, no murmurs, rubs or gallops  GI- soft, non-tender, non-distended, bowel sounds present  Extremities- no clubbing or cyanosis. No edema MS- no significant deformity or atrophy Skin- warm and dry, no rash or lesion; PPM pocket well healed Psych- euthymic mood, full affect Neuro- strength and sensation are intact  PPM Interrogation- reviewed in detail today,  See PACEART report  EKG:  EKG is not ordered today. Personal review of ekg ordered  05/20/2021  shows AF with RVR   Recent Labs: 05/19/2021: Magnesium 1.8 06/21/2021: BUN 50; Creatinine, Ser 1.51; NT-Pro BNP 5,943; Potassium 3.9; Sodium 141   Wt Readings from Last 3 Encounters:  08/23/21 130 lb 6.4 oz (59.1 kg)  06/21/21 129 lb 3.2 oz (58.6 kg)  05/19/21 133 lb 12.8 oz (60.7 kg)     Other studies  Reviewed: Additional studies/ records that were reviewed today include: Previous EP office notes, Previous remote checks, Most recent labwork.   Assessment and Plan:  1. Tachy-Brady syndrome s/p Medtronic PPM  Normal PPM function See Pace Art report No changes today  2. Permanent AF Refused anticoagulation Histograms are right shifted. Declines increase of diltiazem today, would prefer Dr. Agustin Cree to manage.  CHA2DS2/VASc is at least 6  3. Cardiac clearance for total knee Her RCRI is a class II risk, 0.9% risk of major cardiac event. With moderate/severe stenosis, she would be at least moderate risk if general sedation is required. She is NOT device dependent  Echo 05/2021 LVEF 55-60%, mod/severe MR   Current medicines are reviewed at length with the patient today.     Disposition:   Follow up with Dr. Curt Bears in 12 months    Signed, Shirley Friar, PA-C  08/23/2021 9:52 AM  [facility] [address] [place] [address] [phone] (office) [phone] (fax)

## 2021-08-23 NOTE — Patient Instructions (Signed)
Medication Instructions:  Your physician recommends that you continue on your current medications as directed. Please refer to the Current Medication list given to you today.  *If you need a refill on your cardiac medications before your next appointment, please call your pharmacy*   Lab Work: None If you have labs (blood work) drawn today and your tests are completely normal, you will receive your results only by: [place] (if you have MyChart) OR A paper copy in the mail If you have any lab test that is abnormal or we need to change your treatment, we will call you to review the results.   Follow-Up: At [facility], you and your health needs are our priority.  As part of our continuing mission to provide you with exceptional heart care, we have created designated Provider Care Teams.  These Care Teams include your primary Cardiologist (physician) and Advanced Practice Providers (APPs -  Physician Assistants and Nurse Practitioners) who all work together to provide you with the care you need, when you need it.  We recommend signing up for the patient portal called "MyChart".  Sign up information is provided on this After Visit Summary.  MyChart is used to connect with patients for Virtual Visits (Telemedicine).  Patients are able to view lab/test results, encounter notes, upcoming appointments, etc.  Non-urgent messages can be sent to your provider as well.   To learn more about what you can do with MyChart, go to [email or web address].    Your next appointment:   1 year(s) in [place]  The format for your next appointment:   In Person  Provider:   Allegra Lai, MD{

## 2021-08-24 NOTE — Progress Notes (Signed)
Anesthesia Chart Review:   Case: [id number] Date/Time: 08/31/21 1124   Procedure: TOTAL KNEE ARTHROPLASTY (Left: Knee)   Anesthesia type: Spinal   Pre-op diagnosis: OA LEFT KNEE   Location: [facility] / [facility]   Surgeons: Willaim Sheng, MD       DISCUSSION: Pt is 88 years old with hx CAD, permanent atrial fibrillation (pt declines anticoagulation), dilated cardiomyopathy (EF 55-60% on echo 05/26/21), HTN, pacemaker (Medtronic dual chamber implanted 2012 for tachy-brady syndrome), mitral valve regurgitation (mod to severe on 05/26/21 echo)  Pt saw Joesph July, PA with EP cardiology 08/23/21. By notes, pt is spending increased time in afib with RVR. Mr. Chalmers Cater recommended pt increase her diltiazem but pt declined increase stating she "would prefer Dr. Agustin Cree to manage." I reached out to Dr. Orpah Clinton office about this. He is not in this week but his partner Dr. Bettina Gavia agrees pt can increase diltiazem. I communicated with Mr. Chalmers Cater who advises pt should take diltiazem '360mg'$  daily. I shared this dosing recommendation with Dr. Joya Gaskins office (RN Jacobo Forest) who will communicate with pt.    VS: BP (!) 139/54   Pulse 97   Temp 36.8 C (Oral)   Resp 20   Ht 5' (1.524 m)   Wt 59.1 kg   SpO2 99%   BMI 25.47 kg/m   PROVIDERS: - PCP is Nicoletta Dress, MD - Cardiologist is Jenne Campus, MD. Cleared for surgery by Coletta Memos, NP on 07/28/21 - EP cardiologist is Will Curt Bears, MD. Cleared for surgery at moderate risk at last office visit 08/23/21 with Shirley Friar, PA  LABS: Labs reviewed: Acceptable for surgery. (all labs ordered are listed, but only abnormal results are displayed)  Labs Reviewed  BASIC METABOLIC PANEL - Abnormal; Notable for the following components:      Result Value   Glucose, Bld 101 (*)    BUN 32 (*)    Calcium 8.6 (*)    GFR, Estimated 54 (*)    All other components within normal limits  CBC - Abnormal; Notable for the following  components:   RBC 3.23 (*)    Hemoglobin 11.0 (*)    HCT 34.6 (*)    MCV 107.1 (*)    MCH 34.1 (*)    All other components within normal limits  SURGICAL PCR SCREEN    EKG 05/20/21: afib with RVR. ST and T wave abnormality, consider lateral ischemia. Appears stable compared with prior.    CV: Echo 05/26/21:  1. Left ventricular ejection fraction, by estimation, is 55 to 60%. The  left ventricle has normal function. The left ventricle has no regional  wall motion abnormalities. Left ventricular diastolic parameters are  indeterminate.   2. Right ventricular systolic function is normal. The right ventricular  size is normal.   3. Left atrial size was moderately dilated.   4. The mitral valve is normal in structure. Moderate to severe mitral  valve regurgitation. No evidence of mitral stenosis.   5. The aortic valve is normal in structure. Aortic valve regurgitation is  mild. No aortic stenosis is present.   6. The inferior vena cava is normal in size with greater than 50%  respiratory variability, suggesting right atrial pressure of 3 mmHg.   Carotid US 03/02/17:  - Right Carotid: There is evidence in the right ICA of a 1-39% stenosis.  - Left Carotid: There is evidence in the left ICA of a 1-39% stenosis.  - Vertebrals:  Both vertebral arteries  were patent with antegrade flow.  - Subclavians: Normal flow hemodynamics were seen in bilateral subclavian arteries.   Past Medical History:  Diagnosis Date   A-fib ([place])    Anemia    Aortic valve disorder    Arthritis    osteoarthritis - back and knee   Benign hypertensive heart disease without heart failure    Blood transfusion    Body mass index (BMI) 27.0-27.9, adult 01/01/2020   Carpal tunnel syndrome 02/19/2018   Cataract    bilateral   Chest pain at rest 08/18/2014   Chronic atrial fibrillation ([place]) 07/12/2017   Chronic back pain    Chronic thoracic back pain 04/17/2013   Congestive heart failure (CHF) ([place]) 06/03/2020    Coronary artery disease    Degeneration of lumbosacral intervertebral disc 03/31/2014   Dilated cardiomyopathy ([place]) 09/17/2017   Displacement of cervical intervertebral disc without myelopathy 10/22/2014   Displacement of lumbar intervertebral disc without myelopathy 09/24/2012   Diverticulosis    Essential hypertension 10/06/2013   GERD (gastroesophageal reflux disease)    Hematoma 11/04/2013   History of stomach ulcers    Hyperlipidemia    Hypertension    Impingement syndrome of shoulder region 05/17/2014   Lumbar foraminal stenosis 04/19/2020   Neck pain 10/22/2014   Opioid dependence ([place]) 04/19/2020   Osteoarthrosis    Other long term (current) drug therapy 04/19/2020   Pacemaker    Pacemaker reprogramming/check 08/18/2014   medtronic  Device implantedSeptember 2012 by Angie Fava of this note might be different from the original. Overview:  Overview:  medtronic  Device implantedSeptember 2012 by Agustin Cree   Pain in upper limb 04/27/2015   Paroxysmal atrial fibrillation ([place]) 08/18/2014   Formatting of this note might be different from the original. Not anticoagulated secondary to intracranial bleed while on coumadin   Permanent atrial fibrillation ([place]) 02/11/2019   Pneumonia    Rotator cuff tear arthropathy of right shoulder 11/16/2015   S/P shoulder replacement 11/16/2015   Shoulder impingement 05/17/2014   Skin cancer of arm    L arm   Spinal stenosis 03/31/2014   Status post lumbar surgery    Vitamin D deficiency     Past Surgical History:  Procedure Laterality Date   ABDOMINAL HYSTERECTOMY     APPENDECTOMY     BACK SURGERY     five back surgeries   cataract extaction -left     cataract extraction right     CHOLECYSTECTOMY     COLONOSCOPY     CRANIOTOMY     ? subdural hematoma?  Sustained head injury after falling while taking Coumadin   ESOPHAGOGASTRODUODENOSCOPY     EYE SURGERY Bilateral    cataract surgery with lens implant   FRACTURE  SURGERY     left wrist, with plate and removal of plate   INSERT / REPLACE / REMOVE PACEMAKER     Medtronic   KNEE ARTHROPLASTY     right knee   REVERSE SHOULDER ARTHROPLASTY Right 11/16/2015   REVERSE SHOULDER ARTHROPLASTY Right 11/16/2015   Procedure: REVERSE SHOULDER ARTHROPLASTY;  Surgeon: Marchia Bond, MD;  Location: [facility];  Service: Orthopedics;  Laterality: Right;   S1 vertebroplasty     SHOULDER OPEN ROTATOR CUFF REPAIR  right   Skin cancer removal from L arm  11/02/2020    MEDICATIONS:  acetaminophen (TYLENOL) 325 MG tablet   aspirin EC 81 MG tablet   CALCIUM PO   cholecalciferol (VITAMIN D) 25 MCG (1000 UNIT) tablet   denosumab (  PROLIA) 60 MG/ML SOSY injection   diltiazem (CARDIZEM CD) 240 MG 24 hr capsule   famotidine (PEPCID) 40 MG tablet   ibuprofen (ADVIL) 800 MG tablet   Iodine, Kelp, (KELP PO)   Multiple Vitamin (MULTIVITAMIN) tablet   oxyCODONE-acetaminophen (PERCOCET) 10-325 MG tablet   pantoprazole (PROTONIX) 40 MG tablet   Potassium 99 MG TABS   torsemide (DEMADEX) 100 MG tablet   valsartan (DIOVAN) 160 MG tablet   vitamin B-12 (CYANOCOBALAMIN) 1000 MCG tablet   VITAMIN E PO   No current facility-administered medications for this encounter.    If no changes, I anticipate pt can proceed with surgery as scheduled.   Willeen Cass, PhD, FNP-BC [facility]/Anesthesiology Phone: [phone] 08/26/2021 3:59 PM

## 2021-08-26 NOTE — Anesthesia Preprocedure Evaluation (Signed)
Anesthesia Evaluation  Patient identified by MRN, date of birth, ID band Patient awake    Reviewed: Allergy & Precautions, NPO status , Patient's Chart, lab work & pertinent test results  Airway Mallampati: I  TM Distance: >3 FB Neck ROM: Full    Dental no notable dental hx. (+) Upper Dentures, Dental Advisory Given   Pulmonary    Pulmonary exam normal breath sounds clear to auscultation       Cardiovascular hypertension, Pt. on medications + CAD  Normal cardiovascular exam+ dysrhythmias Atrial Fibrillation + pacemaker + Valvular Problems/Murmurs (Mild AI Mod MR) MR and AI  Rhythm:Regular Rate:Normal  05/26/21 ECHO Echo 05/26/21:  1. Left ventricular ejection fraction, by estimation, is 55 to 60%. The  left ventricle has normal function. The left ventricle has no regional  wall motion abnormalities. Left ventricular diastolic parameters are  indeterminate.  2. Right ventricular systolic function is normal. The right ventricular  size is normal.  3. Left atrial size was moderately dilated.  4. The mitral valve is normal in structure. Moderate to severe mitral  valve regurgitation. No evidence of mitral stenosis.  5. The aortic valve is normal in structure. Aortic valve regurgitation is  mild. No aortic stenosis is present.  6. The inferior vena cava is normal in size with greater than 50%  respiratory variability, suggesting right atrial pressure of 3 mmHg.     Neuro/Psych    GI/Hepatic GERD  ,  Endo/Other    Renal/GU negative Renal ROSLab Results      Component                Value               Date                      CREATININE               1.00                08/23/2021                K                        3.5                 08/23/2021                      Musculoskeletal  (+) Arthritis , Osteoarthritis,  Hx of Chronic Back Pain   Abdominal   Peds  Hematology  (+) Blood dyscrasia, anemia , Lab  Results      Component                Value               Date                            HGB                      11.0 (L)            08/23/2021                HCT                      34.6 (L)  08/23/2021                MCV                      107.1 (H)           08/23/2021                 Anesthesia Other Findings ALL: Norvasc, Levaquin, PCN  Reproductive/Obstetrics                           Anesthesia Physical Anesthesia Plan  ASA: 3  Anesthesia Plan: Spinal and Regional   Post-op Pain Management: Regional block* and Minimal or no pain anticipated   Induction:   PONV Risk Score and Plan: Treatment may vary due to age or medical condition  Airway Management Planned: Natural Airway and Nasal Cannula  Additional Equipment: None  Intra-op Plan:   Post-operative Plan:   Informed Consent: I have reviewed the patients History and Physical, chart, labs and discussed the procedure including the risks, benefits and alternatives for the proposed anesthesia with the patient or authorized representative who has indicated his/her understanding and acceptance.     Dental advisory given  Plan Discussed with:   Anesthesia Plan Comments: (See APP note by Durel Salts, FNP   L adductor plus spinal)      Anesthesia Quick Evaluation

## 2021-08-31 ENCOUNTER — Observation Stay (HOSPITAL_COMMUNITY)
Admission: RE | Admit: 2021-08-31 | Discharge: 2021-09-02 | Disposition: A | Discharge status: Home Health | Payer: Medicare Other | Attending: Orthopedic Surgery | Admitting: Orthopedic Surgery

## 2021-08-31 ENCOUNTER — Encounter (HOSPITAL_COMMUNITY)
Admission: RE | Disposition: A | Discharge status: Home Health | Payer: Self-pay | Source: Home / Self Care | Attending: Orthopedic Surgery

## 2021-08-31 ENCOUNTER — Observation Stay (HOSPITAL_COMMUNITY): Payer: Medicare Other

## 2021-08-31 ENCOUNTER — Ambulatory Visit (HOSPITAL_BASED_OUTPATIENT_CLINIC_OR_DEPARTMENT_OTHER): Payer: Medicare Other | Admitting: Anesthesiology

## 2021-08-31 ENCOUNTER — Ambulatory Visit (HOSPITAL_COMMUNITY): Payer: Medicare Other | Admitting: Emergency Medicine

## 2021-08-31 ENCOUNTER — Other Ambulatory Visit: Payer: Self-pay

## 2021-08-31 ENCOUNTER — Encounter (HOSPITAL_COMMUNITY): Payer: Self-pay | Admitting: Orthopedic Surgery

## 2021-08-31 DIAGNOSIS — I110 Hypertensive heart disease with heart failure: Secondary | ICD-10-CM | POA: Diagnosis not present

## 2021-08-31 DIAGNOSIS — Z950 Presence of cardiac pacemaker: Secondary | ICD-10-CM | POA: Insufficient documentation

## 2021-08-31 DIAGNOSIS — Z79899 Other long term (current) drug therapy: Secondary | ICD-10-CM | POA: Diagnosis not present

## 2021-08-31 DIAGNOSIS — I2510 Atherosclerotic heart disease of native coronary artery without angina pectoris: Secondary | ICD-10-CM | POA: Diagnosis not present

## 2021-08-31 DIAGNOSIS — I10 Essential (primary) hypertension: Secondary | ICD-10-CM | POA: Diagnosis not present

## 2021-08-31 DIAGNOSIS — Z7982 Long term (current) use of aspirin: Secondary | ICD-10-CM | POA: Insufficient documentation

## 2021-08-31 DIAGNOSIS — I4891 Unspecified atrial fibrillation: Secondary | ICD-10-CM | POA: Diagnosis not present

## 2021-08-31 DIAGNOSIS — M1712 Unilateral primary osteoarthritis, left knee: Principal | ICD-10-CM | POA: Insufficient documentation

## 2021-08-31 DIAGNOSIS — Z85828 Personal history of other malignant neoplasm of skin: Secondary | ICD-10-CM | POA: Diagnosis not present

## 2021-08-31 DIAGNOSIS — Z471 Aftercare following joint replacement surgery: Secondary | ICD-10-CM | POA: Diagnosis not present

## 2021-08-31 DIAGNOSIS — D649 Anemia, unspecified: Secondary | ICD-10-CM

## 2021-08-31 DIAGNOSIS — Z96652 Presence of left artificial knee joint: Secondary | ICD-10-CM | POA: Diagnosis not present

## 2021-08-31 DIAGNOSIS — I509 Heart failure, unspecified: Secondary | ICD-10-CM | POA: Insufficient documentation

## 2021-08-31 DIAGNOSIS — Z96611 Presence of right artificial shoulder joint: Secondary | ICD-10-CM | POA: Insufficient documentation

## 2021-08-31 DIAGNOSIS — Z01818 Encounter for other preprocedural examination: Secondary | ICD-10-CM

## 2021-08-31 DIAGNOSIS — G8918 Other acute postprocedural pain: Secondary | ICD-10-CM | POA: Diagnosis not present

## 2021-08-31 HISTORY — PX: TOTAL KNEE ARTHROPLASTY: SHX125

## 2021-08-31 SURGERY — ARTHROPLASTY, KNEE, TOTAL
Anesthesia: Regional | Site: Knee | Laterality: Left

## 2021-08-31 MED ORDER — DIPHENHYDRAMINE HCL 12.5 MG/5ML PO ELIX: 12.5000 mg | ORAL_SOLUTION | ORAL | Status: DC | PRN

## 2021-08-31 MED ORDER — ORAL CARE MOUTH RINSE: 15.0000 mL | Freq: Once | OROMUCOSAL | Status: AC

## 2021-08-31 MED ORDER — POVIDONE-IODINE 10 % EX SWAB: 2.0000 "application " | Freq: Once | CUTANEOUS | Status: DC

## 2021-08-31 MED ORDER — DOCUSATE SODIUM 100 MG PO CAPS
100.0000 mg | ORAL_CAPSULE | Freq: Two times a day (BID) | ORAL | Status: DC
  Administered 2021-08-31 – 2021-09-02 (×4): 100 mg via ORAL
  Filled 2021-08-31 (×4): qty 1

## 2021-08-31 MED ORDER — METHOCARBAMOL 500 MG IVPB - SIMPLE MED: 500.0000 mg | Freq: Four times a day (QID) | INTRAVENOUS | Status: DC | PRN

## 2021-08-31 MED ORDER — CELECOXIB 200 MG PO CAPS
400.0000 mg | ORAL_CAPSULE | Freq: Once | ORAL | Status: AC
  Administered 2021-08-31: 400 mg via ORAL
  Filled 2021-08-31: qty 2

## 2021-08-31 MED ORDER — PANTOPRAZOLE SODIUM 40 MG PO TBEC
40.0000 mg | DELAYED_RELEASE_TABLET | Freq: Every day | ORAL | Status: DC
  Administered 2021-08-31 – 2021-09-02 (×3): 40 mg via ORAL
  Filled 2021-08-31 (×3): qty 1

## 2021-08-31 MED ORDER — ONDANSETRON HCL 4 MG/2ML IJ SOLN
INTRAMUSCULAR | Status: DC | PRN
  Administered 2021-08-31: 4 mg via INTRAVENOUS

## 2021-08-31 MED ORDER — FENTANYL CITRATE PF 50 MCG/ML IJ SOSY
50.0000 ug | PREFILLED_SYRINGE | INTRAMUSCULAR | Status: DC
  Administered 2021-08-31: 25 ug via INTRAVENOUS
  Filled 2021-08-31: qty 2

## 2021-08-31 MED ORDER — FENTANYL CITRATE PF 50 MCG/ML IJ SOSY: 25.0000 ug | PREFILLED_SYRINGE | INTRAMUSCULAR | Status: DC | PRN

## 2021-08-31 MED ORDER — PHENYLEPHRINE HCL (PRESSORS) 10 MG/ML IV SOLN
INTRAVENOUS | Status: AC
  Filled 2021-08-31: qty 1

## 2021-08-31 MED ORDER — CLONIDINE HCL (ANALGESIA) 100 MCG/ML EP SOLN
EPIDURAL | Status: DC | PRN
  Administered 2021-08-31: 100 ug

## 2021-08-31 MED ORDER — SODIUM CHLORIDE (PF) 0.9 % IJ SOLN
INTRAMUSCULAR | Status: AC
  Filled 2021-08-31: qty 50

## 2021-08-31 MED ORDER — CEFAZOLIN SODIUM-DEXTROSE 2-4 GM/100ML-% IV SOLN
2.0000 g | Freq: Once | INTRAVENOUS | Status: AC
  Administered 2021-08-31: 2 g via INTRAVENOUS

## 2021-08-31 MED ORDER — POVIDONE-IODINE 10 % EX SWAB
2.0000 "application " | Freq: Once | CUTANEOUS | Status: AC
  Administered 2021-08-31: 2 via TOPICAL

## 2021-08-31 MED ORDER — SODIUM CHLORIDE 0.9% FLUSH
INTRAVENOUS | Status: DC | PRN
  Administered 2021-08-31: 60 mL

## 2021-08-31 MED ORDER — TORSEMIDE 20 MG PO TABS
50.0000 mg | ORAL_TABLET | Freq: Every day | ORAL | Status: DC
  Administered 2021-08-31 – 2021-09-01 (×2): 50 mg via ORAL
  Filled 2021-08-31 (×2): qty 2.5

## 2021-08-31 MED ORDER — 0.9 % SODIUM CHLORIDE (POUR BTL) OPTIME
TOPICAL | Status: DC | PRN
  Administered 2021-08-31: 1000 mL

## 2021-08-31 MED ORDER — BUPIVACAINE IN DEXTROSE 0.75-8.25 % IT SOLN
INTRATHECAL | Status: DC | PRN
  Administered 2021-08-31: 12 mg via INTRATHECAL

## 2021-08-31 MED ORDER — TORSEMIDE 20 MG PO TABS
75.0000 mg | ORAL_TABLET | Freq: Every day | ORAL | Status: DC
  Administered 2021-09-01: 75 mg via ORAL
  Filled 2021-08-31 (×3): qty 1.5

## 2021-08-31 MED ORDER — ACETAMINOPHEN 500 MG PO TABS
1000.0000 mg | ORAL_TABLET | Freq: Four times a day (QID) | ORAL | Status: AC
  Administered 2021-08-31 – 2021-09-01 (×4): 1000 mg via ORAL
  Filled 2021-08-31 (×4): qty 2

## 2021-08-31 MED ORDER — SODIUM CHLORIDE 0.9 % IR SOLN
Status: DC | PRN
  Administered 2021-08-31: 3000 mL

## 2021-08-31 MED ORDER — OXYCODONE HCL 5 MG PO TABS
5.0000 mg | ORAL_TABLET | ORAL | Status: DC | PRN
  Administered 2021-08-31: 5 mg via ORAL
  Administered 2021-09-01 – 2021-09-02 (×7): 10 mg via ORAL
  Filled 2021-08-31: qty 1
  Filled 2021-08-31 (×4): qty 2

## 2021-08-31 MED ORDER — ASPIRIN 81 MG PO CHEW
81.0000 mg | CHEWABLE_TABLET | Freq: Two times a day (BID) | ORAL | Status: DC
  Administered 2021-08-31 – 2021-09-02 (×4): 81 mg via ORAL
  Filled 2021-08-31 (×4): qty 1

## 2021-08-31 MED ORDER — PROPOFOL 500 MG/50ML IV EMUL
INTRAVENOUS | Status: DC | PRN
  Administered 2021-08-31: 75 ug/kg/min via INTRAVENOUS

## 2021-08-31 MED ORDER — SODIUM CHLORIDE (PF) 0.9 % IJ SOLN
INTRAMUSCULAR | Status: AC
  Filled 2021-08-31: qty 10

## 2021-08-31 MED ORDER — ACETAMINOPHEN 325 MG PO TABS
325.0000 mg | ORAL_TABLET | Freq: Four times a day (QID) | ORAL | Status: DC | PRN
  Administered 2021-09-02: 650 mg via ORAL
  Filled 2021-08-31: qty 2

## 2021-08-31 MED ORDER — ACETAMINOPHEN 10 MG/ML IV SOLN: 1000.0000 mg | Freq: Once | INTRAVENOUS | Status: DC | PRN

## 2021-08-31 MED ORDER — VITAMIN D 25 MCG (1000 UNIT) PO TABS
1000.0000 [IU] | ORAL_TABLET | Freq: Every morning | ORAL | Status: DC
  Administered 2021-09-01 – 2021-09-02 (×2): 1000 [IU] via ORAL
  Filled 2021-08-31 (×3): qty 1

## 2021-08-31 MED ORDER — ONDANSETRON HCL 4 MG/2ML IJ SOLN
4.0000 mg | Freq: Once | INTRAMUSCULAR | Status: DC | PRN
Start: 2021-08-31 — End: 2021-08-31

## 2021-08-31 MED ORDER — ONDANSETRON HCL 4 MG/2ML IJ SOLN: 4.0000 mg | Freq: Four times a day (QID) | INTRAMUSCULAR | Status: DC | PRN

## 2021-08-31 MED ORDER — TRANEXAMIC ACID-NACL 1000-0.7 MG/100ML-% IV SOLN
1000.0000 mg | INTRAVENOUS | Status: AC
  Administered 2021-08-31: 1000 mg via INTRAVENOUS
  Filled 2021-08-31: qty 100

## 2021-08-31 MED ORDER — OXYCODONE HCL 5 MG PO TABS
10.0000 mg | ORAL_TABLET | ORAL | Status: DC | PRN
  Filled 2021-08-31 (×3): qty 2

## 2021-08-31 MED ORDER — ROPIVACAINE HCL 5 MG/ML IJ SOLN
INTRAMUSCULAR | Status: DC | PRN
  Administered 2021-08-31: 30 mL via PERINEURAL

## 2021-08-31 MED ORDER — BUPIVACAINE LIPOSOME 1.3 % IJ SUSP
INTRAMUSCULAR | Status: AC
  Filled 2021-08-31: qty 20

## 2021-08-31 MED ORDER — HYDROMORPHONE HCL 1 MG/ML IJ SOLN
0.5000 mg | INTRAMUSCULAR | Status: DC | PRN
  Administered 2021-08-31 – 2021-09-02 (×3): 1 mg via INTRAVENOUS
  Filled 2021-08-31 (×4): qty 1

## 2021-08-31 MED ORDER — POLYETHYLENE GLYCOL 3350 17 G PO PACK: 17.0000 g | PACK | Freq: Every day | ORAL | Status: DC | PRN

## 2021-08-31 MED ORDER — PHENYLEPHRINE HCL-NACL 20-0.9 MG/250ML-% IV SOLN
INTRAVENOUS | Status: DC | PRN
  Administered 2021-08-31: 50 ug/min via INTRAVENOUS

## 2021-08-31 MED ORDER — DILTIAZEM HCL ER COATED BEADS 240 MG PO CP24
240.0000 mg | ORAL_CAPSULE | Freq: Every day | ORAL | Status: DC
  Administered 2021-09-01 – 2021-09-02 (×2): 240 mg via ORAL
  Filled 2021-08-31 (×2): qty 1

## 2021-08-31 MED ORDER — CHLORHEXIDINE GLUCONATE 0.12 % MT SOLN
15.0000 mL | Freq: Once | OROMUCOSAL | Status: AC
  Administered 2021-08-31: 15 mL via OROMUCOSAL

## 2021-08-31 MED ORDER — PHENOL 1.4 % MT LIQD: 1.0000 | OROMUCOSAL | Status: DC | PRN

## 2021-08-31 MED ORDER — BUPIVACAINE LIPOSOME 1.3 % IJ SUSP: 20.0000 mL | Freq: Once | INTRAMUSCULAR | Status: DC

## 2021-08-31 MED ORDER — DEXAMETHASONE SODIUM PHOSPHATE 10 MG/ML IJ SOLN
8.0000 mg | Freq: Once | INTRAMUSCULAR | Status: AC
  Administered 2021-08-31: 8 mg via INTRAVENOUS

## 2021-08-31 MED ORDER — SURGIRINSE WOUND IRRIGATION SYSTEM - OPTIME
TOPICAL | Status: DC | PRN
  Administered 2021-08-31: 450 mL via TOPICAL

## 2021-08-31 MED ORDER — MENTHOL 3 MG MT LOZG: 1.0000 | LOZENGE | OROMUCOSAL | Status: DC | PRN

## 2021-08-31 MED ORDER — WATER FOR IRRIGATION, STERILE IR SOLN
Status: DC | PRN
  Administered 2021-08-31: 2000 mL

## 2021-08-31 MED ORDER — ONDANSETRON HCL 4 MG PO TABS: 4.0000 mg | ORAL_TABLET | Freq: Four times a day (QID) | ORAL | Status: DC | PRN

## 2021-08-31 MED ORDER — METHOCARBAMOL 500 MG PO TABS
500.0000 mg | ORAL_TABLET | Freq: Four times a day (QID) | ORAL | Status: DC | PRN
  Administered 2021-09-01 – 2021-09-02 (×3): 500 mg via ORAL
  Filled 2021-08-31 (×3): qty 1

## 2021-08-31 MED ORDER — LACTATED RINGERS IV SOLN: INTRAVENOUS | Status: DC

## 2021-08-31 MED ORDER — BUPIVACAINE LIPOSOME 1.3 % IJ SUSP
INTRAMUSCULAR | Status: DC | PRN
  Administered 2021-08-31: 20 mL

## 2021-08-31 MED ORDER — ACETAMINOPHEN 500 MG PO TABS: 1000.0000 mg | ORAL_TABLET | Freq: Once | ORAL | Status: DC

## 2021-08-31 MED ORDER — VANCOMYCIN HCL IN DEXTROSE 1-5 GM/200ML-% IV SOLN
1000.0000 mg | INTRAVENOUS | Status: DC
  Filled 2021-08-31: qty 200

## 2021-08-31 MED ORDER — CEFAZOLIN SODIUM-DEXTROSE 2-4 GM/100ML-% IV SOLN
2.0000 g | Freq: Four times a day (QID) | INTRAVENOUS | Status: AC
  Administered 2021-08-31 (×2): 2 g via INTRAVENOUS
  Filled 2021-08-31 (×2): qty 100

## 2021-08-31 MED ORDER — KETOROLAC TROMETHAMINE 15 MG/ML IJ SOLN
7.5000 mg | Freq: Four times a day (QID) | INTRAMUSCULAR | Status: DC
  Administered 2021-08-31 – 2021-09-01 (×3): 7.5 mg via INTRAVENOUS
  Filled 2021-08-31 (×3): qty 1

## 2021-08-31 SURGICAL SUPPLY — 73 items
ADH SKN CLS APL DERMABOND .7 (GAUZE/BANDAGES/DRESSINGS) ×1
APL PRP STRL LF DISP 70% ISPRP (MISCELLANEOUS) ×2
BAG COUNTER SPONGE SURGICOUNT (BAG) ×1 IMPLANT
BAG SPNG CNTER NS LX DISP (BAG) ×1
BLADE SAG 13.0X1.37X90 (BLADE) ×1 IMPLANT
BLADE SAG 18X100X1.27 (BLADE) ×3 IMPLANT
BLADE SAW RECIPROCATING 77.5 (BLADE) ×1 IMPLANT
BLADE SAW SAG 35X64 .89 (BLADE) ×3 IMPLANT
BNDG CMPR 5X3 CHSV STRCH STRL (GAUZE/BANDAGES/DRESSINGS) ×1
BNDG CMPR MED 10X6 ELC LF (GAUZE/BANDAGES/DRESSINGS) ×1
BNDG COHESIVE 3X5 TAN ST LF (GAUZE/BANDAGES/DRESSINGS) ×3 IMPLANT
BNDG ELASTIC 6X10 VLCR STRL LF (GAUZE/BANDAGES/DRESSINGS) ×3 IMPLANT
BOWL SMART MIX CTS (DISPOSABLE) ×4 IMPLANT
BSPLAT TIB 5D E CMNT STM LT (Knees) ×1 IMPLANT
CEMENT BONE R 1X40 (Cement) ×8 IMPLANT
CHLORAPREP W/TINT 26 (MISCELLANEOUS) ×6 IMPLANT
CLSR STERI-STRIP ANTIMIC 1/2X4 (GAUZE/BANDAGES/DRESSINGS) ×1 IMPLANT
COVER SURGICAL LIGHT HANDLE (MISCELLANEOUS) ×3 IMPLANT
CUFF TOURN SGL QUICK 34 (TOURNIQUET CUFF) ×2
CUFF TRNQT CYL 34X4.125X (TOURNIQUET CUFF) ×2 IMPLANT
DERMABOND ADVANCED (GAUZE/BANDAGES/DRESSINGS) ×1
DERMABOND ADVANCED .7 DNX12 (GAUZE/BANDAGES/DRESSINGS) ×2 IMPLANT
DRAPE INCISE IOBAN 85X60 (DRAPES) ×3 IMPLANT
DRAPE SHEET LG 3/4 BI-LAMINATE (DRAPES) ×3 IMPLANT
DRAPE U-SHAPE 47X51 STRL (DRAPES) ×3 IMPLANT
DRESSING AQUACEL AG SP 3.5X10 (GAUZE/BANDAGES/DRESSINGS) ×2 IMPLANT
DRSG AQUACEL AG ADV 3.5X10 (GAUZE/BANDAGES/DRESSINGS) ×1 IMPLANT
DRSG AQUACEL AG SP 3.5X10 (GAUZE/BANDAGES/DRESSINGS) ×2
ELECT REM PT RETURN 15FT ADLT (MISCELLANEOUS) ×3 IMPLANT
FEMUR  CMT CCR STD SZ7 L KNEE (Knees) ×2 IMPLANT
FEMUR CMT CCR STD SZ7 L KNEE (Knees) ×1 IMPLANT
FEMUR CMTD CCR STD SZ7 L KNEE (Knees) IMPLANT
GAUZE SPONGE 4X4 12PLY STRL (GAUZE/BANDAGES/DRESSINGS) ×3 IMPLANT
GLOVE BIOGEL M 6.5 STRL (GLOVE) ×3 IMPLANT
GLOVE BIOGEL PI IND STRL 6.5 (GLOVE) IMPLANT
GLOVE BIOGEL PI IND STRL 8 (GLOVE) ×2 IMPLANT
GLOVE BIOGEL PI INDICATOR 6.5 (GLOVE) ×3
GLOVE BIOGEL PI INDICATOR 8 (GLOVE) ×1
GLOVE SURG ORTHO 8.0 STRL STRW (GLOVE) ×6 IMPLANT
GOWN STRL REUS W/ TWL XL LVL3 (GOWN DISPOSABLE) ×2 IMPLANT
GOWN STRL REUS W/TWL XL LVL3 (GOWN DISPOSABLE) ×6
HANDPIECE INTERPULSE COAX TIP (DISPOSABLE) ×2
HDLS TROCR DRIL PIN KNEE 75 (PIN) ×8
HOLDER FOLEY CATH W/STRAP (MISCELLANEOUS) ×3 IMPLANT
HOOD PEEL AWAY FLYTE STAYCOOL (MISCELLANEOUS) ×9 IMPLANT
MANIFOLD NEPTUNE II (INSTRUMENTS) ×3 IMPLANT
MARKER SKIN DUAL TIP RULER LAB (MISCELLANEOUS) ×3 IMPLANT
NS IRRIG 1000ML POUR BTL (IV SOLUTION) ×3 IMPLANT
PACK TOTAL KNEE CUSTOM (KITS) ×3 IMPLANT
PIN DRILL HDLS TROCAR 75 4PK (PIN) IMPLANT
PROTECTOR NERVE ULNAR (MISCELLANEOUS) ×3 IMPLANT
PSN ASF CPS VE L 6-9EF 10 (Joint) ×2 IMPLANT
SCREW HEADED 33MM KNEE (MISCELLANEOUS) ×3 IMPLANT
SET HNDPC FAN SPRY TIP SCT (DISPOSABLE) ×2 IMPLANT
SPIKE FLUID TRANSFER (MISCELLANEOUS) ×3 IMPLANT
STEM POLY PAT PLY 32M KNEE (Knees) ×1 IMPLANT
STEM TIB ST PERS 14+30 (Stem) ×1 IMPLANT
STEM TIBIA 5 DEG SZ E L KNEE (Knees) IMPLANT
STRIP CLOSURE SKIN 1/2X4 (GAUZE/BANDAGES/DRESSINGS) ×3 IMPLANT
SURFACE ARTC PRSNA CPS6-9EF 10 (Joint) IMPLANT
SUT MNCRL AB 3-0 PS2 18 (SUTURE) ×3 IMPLANT
SUT STRATAFIX 0 PDS 27 VIOLET (SUTURE) ×2
SUT STRATAFIX PDO 1 14 VIOLET (SUTURE) ×2
SUT STRATFX PDO 1 14 VIOLET (SUTURE) ×1
SUT VIC AB 2-0 CT2 27 (SUTURE) ×6 IMPLANT
SUTURE STRATFX 0 PDS 27 VIOLET (SUTURE) ×2 IMPLANT
SUTURE STRATFX PDO 1 14 VIOLET (SUTURE) ×2 IMPLANT
SYR 50ML LL SCALE MARK (SYRINGE) ×3 IMPLANT
TIBIA STEM 5 DEG SZ E L KNEE (Knees) ×2 IMPLANT
TRAY FOLEY MTR SLVR 14FR STAT (SET/KITS/TRAYS/PACK) ×1 IMPLANT
TUBE SUCTION HIGH CAP CLEAR NV (SUCTIONS) ×3 IMPLANT
UNDERPAD 30X36 HEAVY ABSORB (UNDERPADS AND DIAPERS) ×3 IMPLANT
WRAP KNEE MAXI GEL POST OP (GAUZE/BANDAGES/DRESSINGS) ×1 IMPLANT

## 2021-08-31 NOTE — Discharge Instructions (Signed)

## 2021-08-31 NOTE — Plan of Care (Signed)
  Problem: Education: Goal: Knowledge of General Education information will improve Description: Including pain rating scale, medication(s)/side effects and non-pharmacologic comfort measures Outcome: Progressing   Problem: Pain Managment: Goal: General experience of comfort will improve Outcome: Progressing   Problem: Safety: Goal: Ability to remain free from injury will improve Outcome: Progressing   

## 2021-08-31 NOTE — Op Note (Signed)
DATE OF SURGERY:  08/31/2021 TIME: 1:38 PM  PATIENT NAME:  [name]   AGE: 88 y.o.    PRE-OPERATIVE DIAGNOSIS:  End stage left knee osteoarthritis  POST-OPERATIVE DIAGNOSIS:  Same  PROCEDURE:  Left Total Knee Arthroplasty  SURGEON:  Belvin Gauss A Zavion Sleight, MD   ASSISTANT:  Izola Price, RNFA, present and scrubbed throughout the case, critical for assistance with exposure, retraction, instrumentation, and closure.   OPERATIVE IMPLANTS:  Cemented Zimmer persona left PS size 7 femur standard, E tibia with 30 mm stem extension, 32 mm patella, 10 mm CPS poly bearing i Implant Name Type Inv. Item Serial No. Manufacturer Lot No. LRB No. Used Action  CEMENT BONE R 1X40 - [id number] Cement CEMENT BONE R 1X40  ZIMMER [employer]ON(ORTH,TRAU,BIO,SG) [id number] Left 2 Implanted  STEM POLY PAT PLY 57M KNEE - [id number] Knees STEM POLY PAT PLY 57M KNEE  ZIMMER [employer]ON(ORTH,TRAU,BIO,SG) [id number] Left 1 Implanted  FEMUR  CMT CCR STD SZ7 L KNEE - [id number] Knees FEMUR  CMT CCR STD SZ7 L KNEE  ZIMMER RECON(ORTH,TRAU,BIO,SG) [id number] Left 1 Implanted  STEM TIB ST PERS 14+30 - [id number] Stem STEM TIB ST PERS 14+30  ZIMMER RECON(ORTH,TRAU,BIO,SG) [id number] Left 1 Implanted  TIBIA STEM 5 DEG SZ E L KNEE - [id number] Knees TIBIA STEM 5 DEG SZ E L KNEE  ZIMMER RECON(ORTH,TRAU,BIO,SG) [id number] Left 1 Implanted  PSN ASF CPS VE L 6-9EF 10 - [id number] Joint PSN ASF CPS VE L 6-9EF 10  ZIMMER [employer]ON(ORTH,TRAU,BIO,SG) [id number] Left 1 Implanted  CEMENT BONE R 1X40 - [id number] Cement CEMENT BONE R 1X40  ZIMMER [employer]ON(ORTH,TRAU,BIO,SG) [id number] Left 2 Implanted  PSN ASF CPS VE L 6-9EF 10 - [id number] Joint PSN ASF CPS VE L 6-9EF 10  ZIMMER [employer]ON(ORTH,TRAU,BIO,SG) [id number] Left 1 Implanted    PREOPERATIVE INDICATIONS:  [name] is a 88 y.o. year old female with end stage bone on bone degenerative arthritis of the knee who failed conservative treatment, including injections, antiinflammatories, activity modification, and  assistive devices, and had significant impairment of their activities of daily living, and elected for Total Knee Arthroplasty.   The risks, benefits, and alternatives were discussed at length including but not limited to the risks of infection, bleeding, nerve injury, stiffness, blood clots, the need for revision surgery, cardiopulmonary complications, among others, and they were willing to proceed.  OPERATIVE FINDINGS AND UNIQUE ASPECTS OF THE CASE: Severe preoperative valgus deformity.  Very osteoporotic bone in the medial tibial plateau.  Elected to add a 30 mm stem extension for additional fixation of the tibia.  Elected to cut the tibia in 2 degrees of valgus to help balance the knee.  Extensive lateral release was performed.  CPS polyethylene was used to help with varus valgus balance of the knee.  ESTIMATED BLOOD LOSS: 40cc  OPERATIVE DESCRIPTION:   Once adequate anesthesia, preoperative antibiotics, 2 gm of ancef,1 gm of Tranexamic Acid, and 8 mg of Decadron administered, the patient was positioned supine with a left thigh tourniquet placed.  The left lower extremity was prepped and draped in sterile fashion.  A time-  out was performed identifying the patient, planned procedure, and the appropriate extremity.     The leg was  exsanguinated, tourniquet elevated to 250 mmHg.  A midline incision was   made followed by median parapatellar arthrotomy. Anterior horn of the medial meniscus was released and resected. A medial release was performed, the infrapatellar fat pad was resected with care taken to protect the patellar tendon. The suprapatellar fat was  removed to exposed the distal anterior femur. The anterior horn of the lateral meniscus and ACL were released.    Following initial  exposure, attention was first to the femur.  The femoral   canal was opened with a drill, canal was suctioned to try to prevent fat emboli.  An   intramedullary rod was passed set at 5 degrees valgus, 10 mm. The  distal femur was resected.  Felt this was a deficient cut laterally so an additional 2 mm was cut at which point we had adequate depth medially and laterally.  Following this resection, the tibia was   subluxated anteriorly.  Using the extramedullary guide, 26m of bone was resected off the tibial plateau defect.  The lateral side was still markedly tight.  Using Bovie cautery and a 15 blade the IT band was released at the level of the joint line laterally.  This was continued posteriorly until we had released the tightest structures palpable.  With this although we had a neutral tibial cut was still excessively tight laterally.  Also visualizing posterior laterally there was significant sclerotic bone.  Elected to recut the tibia and 2 degrees of valgus to undercut the sclerotic bone laterally and help improve balance.  At this point, we confirmed the gap would be   stable medially and laterally with a size 125mspacer block as well as confirmed that the tibial cut was perpendicular in the coronal plane, checking with an alignment rod.    Once this was done, the posterior femoral referencing femoral sizer was placed under to the posterior condyles with 3 degrees of external rotational which was parallel to the transepicondylar axis and perpendicular to WhEastman ChemicalThe femur was sized to be a size 7 in the anterior-  posterior dimension. The   anterior, posterior, and  chamfer cuts were made without difficulty nor   notching making certain that I was along the anterior cortex to help   with flexion gap stability.  Next a laminar spreader was placed with the knee in flexion and the medial lateral menisci were resected.  5 cc of the Exparel mixture was injected in the medial side of the back of the knee and 3 cc in the lateral side.  1/2 inch curved osteotome was used to resect posterior osteophyte that was then removed with a pituitary rongeur.       At this point, the tibia was sized to be a size E.   The size E tray was   then pinned in position.  The box for the femur was then cut, we cut for the CPS box to provide better varus valgus stability.  Trial reduction was now carried with a 7 femur,  E tibia, a 10 mm MC insert.  The knee had full extension and was stable to varus valgus stress in extension.    Attention was next directed to the patella.  Precut  measurement was noted to be 23 mm.  I resected down to 13 mm and used a  3296matellar button to restore patellar height as well as cover the cut surface.     The patella lug holes were drilled and a 32 mm patella poly trial was placed.    The knee was brought to full extension with good flexion stability with the patella   tracking through the trochlea with the application of 2 towel clamps to reapproximate the arthrotomy.   Next the femoral component was again assessed and determined to be seated  and appropriately lateralized.  The femoral lug holes were drilled.  The femoral component was then removed.Tibial component was again assessed and felt to be seated and appropriately rotated with the medial third of the tubercle. The tibia was then drilled, and keel punched.     Final components were  opened and regular cement was mixed.  The tibia was then cemented into place.  Only about 2 cement the femur noted that the lug holes not yet been drilled.  We then drilled the lug holes.  New cement was prepared for the femur and patella.    Final implants were then  cemented onto cleaned and dried cut surfaces of bone with the knee brought to extension with a 10 mm PS poly.  The knee was irrigated with sterile Betadine diluted in saline as well as pulse lavage normal saline.  The synovial lining was  then injected a dilute Exparel.      Once the cement had fully cured, excess cement was removed   throughout the knee.  I confirmed that I was satisfied with the range of   motion and stability, and the final 10 mm CPS poly insert was chosen.  It  was   placed into the knee.         The tourniquet had been let down.  No significant   hemostasis was required.  The medial parapatellar arthrotomy was then reapproximated using #1 Stratafix sutures with the knee  in flexion.  The   remaining wound was closed with 0 stratafix, 2-0 Vicryl, and running 3-0 Monocryl.   The knee was cleaned, dried, dressed sterilely using Dermabond and   Aquacel dressing.  The patient was then  brought to recovery room in stable condition, tolerating the procedure  well. There were no complications.   Post op recs: WB: WBAT Abx: ancef x23 hours post op Imaging: PACU xrays DVT prophylaxis: Aspirin '81mg'$  BID x4 weeks Follow up: 2 weeks after surgery for a wound check with Dr. Zachery Dakins at [facility]  Address: [address], [place], [place] [address]  Office Phone: [phone]  Charlies Constable, MD Orthopaedic Surgery

## 2021-08-31 NOTE — Plan of Care (Signed)
  Problem: Activity: Goal: Risk for activity intolerance will decrease Outcome: Progressing   Problem: Pain Managment: Goal: General experience of comfort will improve Outcome: Progressing   Problem: Skin Integrity: Goal: Risk for impaired skin integrity will decrease Outcome: Progressing   

## 2021-08-31 NOTE — Transfer of Care (Signed)
Immediate Anesthesia Transfer of Care Note  Patient: [name]  Procedure(s) Performed: TOTAL KNEE ARTHROPLASTY (Left: Knee)  Patient Location: PACU  Anesthesia Type:Spinal  Level of Consciousness: awake, alert  and patient cooperative  Airway & Oxygen Therapy: Patient Spontanous Breathing and Patient connected to face mask oxygen  Post-op Assessment: Report given to RN and Post -op Vital signs reviewed and stable  Post vital signs: Reviewed, stable  Last Vitals:  Vitals Value Taken Time  BP 138/80 08/31/21 1400  Temp    Pulse 80 08/31/21 1411  Resp 11 08/31/21 1411  SpO2 100 % 08/31/21 1411  Vitals shown include unvalidated device data.  Last Pain:  Vitals:   08/31/21 1356  TempSrc:   PainSc: 0-No pain      Patients Stated Pain Goal: 5 (01/60/10 9323)  Complications: No notable events documented.

## 2021-08-31 NOTE — Progress Notes (Signed)
     Subjective:  Patient reports pain as mild.  Worked well with PT after surgery. In the recliner this evening. Denies distal n/t.  Objective:   VITALS:   Vitals:   08/31/21 1445 08/31/21 1500 08/31/21 1540 08/31/21 1625  BP: (!) 150/80 140/65 (!) 165/67 (!) 149/91  Pulse: 72 88 93   Resp: 15 13 14    Temp:  97.7 F (36.5 C) 98 F (36.7 C)   TempSrc:   Oral   SpO2: 100% 100% 96% 94%  Weight:      Height:        Sensation intact distally Intact pulses distally Dorsiflexion/Plantar flexion intact Incision: dressing C/D/I Compartment soft   Lab Results  Component Value Date   WBC 6.3 08/23/2021   HGB 11.0 (L) 08/23/2021   HCT 34.6 (L) 08/23/2021   MCV 107.1 (H) 08/23/2021   PLT 296 08/23/2021   BMET    Component Value Date/Time   NA 144 08/23/2021 0944   NA 141 06/21/2021 1510   K 3.5 08/23/2021 0944   CL 109 08/23/2021 0944   CO2 25 08/23/2021 0944   GLUCOSE 101 (H) 08/23/2021 0944   BUN 32 (H) 08/23/2021 0944   BUN 50 (H) 06/21/2021 1510   CREATININE 1.00 08/23/2021 0944   CALCIUM 8.6 (L) 08/23/2021 0944   EGFR 33 (L) 06/21/2021 1510   GFRNONAA 54 (L) 08/23/2021 0944      Xray: Postop x-rays demonstrate total knee arthroplasty components excellent position without adverse features.  Assessment/Plan: Day of Surgery   Principal Problem:   Localized osteoarthritis of left knee  S/p L TKA 6/14  Post op recs: WB: WBAT Abx: ancef x23 hours post op Imaging: PACU xrays DVT prophylaxis: Aspirin 81mg  BID x4 weeks Follow up: 2 weeks after surgery for a wound check with Dr. Zachery Dakins at [facility].  Address: [address] [place], [place], [place] [address]  Office Phone: [phone]   Charlies Constable, MD Orthopaedic Surgery      [name] A Rulo 08/31/2021, 8:38 PM   Charlies Constable, MD  Contact information:   [phone] 7am-5pm epic message Dr. Zachery Dakins, or call office for patient follow up: [phone] After  hours and holidays please check [email or web address] for group call information for [facility]

## 2021-08-31 NOTE — Anesthesia Procedure Notes (Addendum)
Anesthesia Regional Block: Adductor canal block   Pre-Anesthetic Checklist: , timeout performed,  Correct Patient, Correct Site, Correct Laterality,  Correct Procedure, Correct Position, site marked,  Risks and benefits discussed,  Surgical consent,  Pre-op evaluation,  At surgeon's request and post-op pain management  Laterality: Lower and Left  Prep: chloraprep       Needles:  Injection technique: Single-shot  Needle Type: Echogenic Needle     Needle Length: 9cm  Needle Gauge: 22     Additional Needles:   Procedures:,,,, ultrasound used (permanent image in chart),,    Narrative:  Start time: 08/31/2021 10:25 AM End time: 08/31/2021 10:33 AM Injection made incrementally with aspirations every 5 mL.  Performed by: Personally  Anesthesiologist: Barnet Glasgow, MD  Additional Notes: Block assessed prior to surgery. Pt tolerated procedure well.

## 2021-08-31 NOTE — Evaluation (Signed)
Physical Therapy Evaluation Patient Details Name: [name] MRN: [id number] DOB: [birth year] Today's Date: 08/31/2021  History of Present Illness  Pt is an 88yo female presenting s.p L-TKA on 08/31/21. PMH: Afib, HTN, back pain, CHF, CAD, GERD, opioid dependence, OA, pacemaker, R-RTS, R-TKA, back surgery.  Clinical Impression  [name] is a 88 y.o. female POD 0 s/p L-TKA. Patient reports modified independence with mobility at baseline. Patient is now limited by functional impairments (see PT problem list below) and requires supervision for bed mobility, min guard for sit to stand transfer, and min assist +2 for step pivot transfer; pt sliding L foot along floor likely secondary to anesthesia not fully cleared yet. Patient instructed in exercise to facilitate ROM and circulation to manage edema as well as incentive spirometry with multimodal cuing. Patient will benefit from continued skilled PT interventions to address impairments and progress towards PLOF. Acute PT will follow to progress mobility and HEP in preparation for safe discharge home.       Recommendations for follow up therapy are one component of a multi-disciplinary discharge planning process, led by the attending physician.  Recommendations may be updated based on patient status, additional functional criteria and insurance authorization.  Follow Up Recommendations Follow physician's recommendations for discharge plan and follow up therapies    Assistance Recommended at Discharge Intermittent Supervision/Assistance  Patient can return home with the following  A little help with walking and/or transfers;A little help with bathing/dressing/bathroom;Assistance with cooking/housework;Assist for transportation;Help with stairs or ramp for entrance    Equipment Recommendations Rolling walker (2 wheels) (Pt reports she has one but it is very old.)  Recommendations for Other Services       Functional Status Assessment  Patient has had a recent decline in their functional status and demonstrates the ability to make significant improvements in function in a reasonable and predictable amount of time.     Precautions / Restrictions Precautions Precautions: Fall Restrictions Weight Bearing Restrictions: No Other Position/Activity Restrictions: wbat      Mobility  Bed Mobility Overal bed mobility: Needs Assistance Bed Mobility: Supine to Sit     Supine to sit: Supervision     General bed mobility comments: For safety only.    Transfers Overall transfer level: Needs assistance Equipment used: Rolling walker (2 wheels) Transfers: Sit to/from Stand, Bed to chair/wheelchair/BSC Sit to Stand: Min guard   Step pivot transfers: Min assist, +2 safety/equipment       General transfer comment: Sit to stand: Pt min guard for safety only, no physical assist (pt requesting no assist "now don't help me up, don't tug on me!"). Pt standing with trunk flexed forward, resting forearms on RW and unable to stand upright. Step Pivot: Min assist +2 for steadying and maneuvering RW, pt sliding L foot on floor without picking it up, no overt LOB    Ambulation/Gait               General Gait Details: deferred  Stairs            Wheelchair Mobility    Modified Rankin (Stroke Patients Only)       Balance Overall balance assessment: Needs assistance Sitting-balance support: Feet supported, No upper extremity supported Sitting balance-Leahy Scale: Good Sitting balance - Comments: Pt sat with hands in lap to tell story about her animals.   Standing balance support: Reliant on assistive device for balance, During functional activity, Bilateral upper extremity supported Standing balance-Leahy Scale: Poor Standing balance comment: pt  with very poor standing posture, very flexed, forearms resting on RW and unable or unwilling to stand upright.                             Pertinent  Vitals/Pain Pain Assessment Pain Assessment: 0-10 Pain Score: 6  Pain Location: left knee Pain Descriptors / Indicators: Operative site guarding    Home Living Family/patient expects to be discharged to:: Private residence Living Arrangements: Alone Available Help at Discharge: Family;Friend(s);Available 24 hours/day (Daughter during the day, friend at night.) Type of Home: House Home Access: Ramped entrance       Home Layout: One level Home Equipment: Conservation officer, nature (2 wheels);Cane - single point;Tub bench;Rollator (4 wheels)      Prior Function Prior Level of Function : Independent/Modified Independent             Mobility Comments: Uses rollator for mobility ADLs Comments: ind     Hand Dominance   Dominant Hand: Right    Extremity/Trunk Assessment   Upper Extremity Assessment Upper Extremity Assessment: Overall WFL for tasks assessed    Lower Extremity Assessment Lower Extremity Assessment: RLE deficits/detail;LLE deficits/detail RLE Deficits / Details: MMT ank DF/PF 4/5 RLE Sensation: WNL LLE Deficits / Details: MMT ank DF/PF 4/5, no extensor lag noted LLE Sensation: WNL    Cervical / Trunk Assessment Cervical / Trunk Assessment: Kyphotic;Back Surgery  Communication   Communication: HOH  Cognition Arousal/Alertness: Awake/alert Behavior During Therapy: WFL for tasks assessed/performed Overall Cognitive Status: Within Functional Limits for tasks assessed                                          General Comments      Exercises Total Joint Exercises Ankle Circles/Pumps: AROM, Both, 5 reps Other Exercises Other Exercises: Incentive Spirometry x4, VCs for slow and controlled, pt reached 1010m   Assessment/Plan    PT Assessment Patient needs continued PT services  PT Problem List Decreased strength;Decreased range of motion;Decreased activity tolerance;Decreased balance;Decreased mobility;Decreased coordination;Decreased knowledge  of use of DME;Pain       PT Treatment Interventions DME instruction;Gait training;Stair training;Functional mobility training;Therapeutic activities;Therapeutic exercise;Balance training;Neuromuscular re-education;Patient/family education    PT Goals (Current goals can be found in the Care Plan section)  Acute Rehab PT Goals Patient Stated Goal: To remain as independent as possible. PT Goal Formulation: With patient Time For Goal Achievement: 09/07/21 Potential to Achieve Goals: Good    Frequency 7X/week     Co-evaluation               AM-PAC PT "6 Clicks" Mobility  Outcome Measure Help needed turning from your back to your side while in a flat bed without using bedrails?: None Help needed moving from lying on your back to sitting on the side of a flat bed without using bedrails?: None Help needed moving to and from a bed to a chair (including a wheelchair)?: A Little Help needed standing up from a chair using your arms (e.g., wheelchair or bedside chair)?: A Little Help needed to walk in hospital room?: A Lot Help needed climbing 3-5 steps with a railing? : A Little 6 Click Score: 19    End of Session Equipment Utilized During Treatment: Gait belt Activity Tolerance: Patient tolerated treatment well;No increased pain Patient left: in chair;with call bell/phone within reach;with chair alarm set;with nursing/sitter  in room Nurse Communication: Mobility status;Patient requests pain meds PT Visit Diagnosis: Pain;Difficulty in walking, not elsewhere classified (R26.2) Pain - Right/Left: Left Pain - part of body: Knee    Time: 9806-9996 PT Time Calculation (min) (ACUTE ONLY): 25 min   Charges:   PT Evaluation $PT Eval Low Complexity: 1 Low PT Treatments $Therapeutic Exercise: 8-22 mins        Coolidge Breeze, PT, DPT [facility] Rehabilitation Department Office: [phone] Pager: [phone]  Coolidge Breeze 08/31/2021, 5:30 PM

## 2021-08-31 NOTE — Progress Notes (Signed)
Orthopedic Tech Progress Note Patient Details:  [name] 1932/01/26 [id number]  Patient ID: [name], female   DOB: 1932/02/02, 88 y.o.   MRN: [id number]  [name] 08/31/2021, 4:06 PM Bone foam applied to left leg in room.

## 2021-08-31 NOTE — Anesthesia Postprocedure Evaluation (Signed)
Anesthesia Post Note  Patient: [name]  Procedure(s) Performed: TOTAL KNEE ARTHROPLASTY (Left: Knee)     Patient location during evaluation: Nursing Unit Anesthesia Type: Regional Level of consciousness: oriented and awake and alert Pain management: pain level controlled Vital Signs Assessment: post-procedure vital signs reviewed and stable Respiratory status: spontaneous breathing and respiratory function stable Cardiovascular status: blood pressure returned to baseline and stable Postop Assessment: no headache, no backache, no apparent nausea or vomiting and patient able to bend at knees Anesthetic complications: no   No notable events documented.  Last Vitals:  Vitals:   08/31/21 1540 08/31/21 1625  BP: (!) 165/67 (!) 149/91  Pulse: 93   Resp: 14   Temp: 36.7 C   SpO2: 96% 94%    Last Pain:  Vitals:   08/31/21 1540  TempSrc: Oral  PainSc: 0-No pain                 Barnet Glasgow

## 2021-08-31 NOTE — Interval H&P Note (Signed)
The patient has been re-examined, and the chart reviewed, and there have been no interval changes to the documented history and physical.    Plan for L TKA today for L knee OA. Given severe valgus deformity and patient's age plan for tibial stem extension as well as PS vs CPS liner for constraint and balancing.  The operative side was examined and the patient was confirmed to have. Sens DPN, SPN, TN intact, Motor EHL, ext, flex 5/5, and DP 2+, PT 2+, No significant edema.   The risks, benefits, and alternatives have been discussed at length with patient, and the patient is willing to proceed.  Left knee marked. Consent has been signed.

## 2021-08-31 NOTE — Anesthesia Procedure Notes (Signed)
Spinal  Patient location during procedure: OR Start time: 08/31/2021 11:00 AM End time: 08/31/2021 11:06 AM Reason for block: surgical anesthesia Staffing Performed: anesthesiologist  Anesthesiologist: Barnet Glasgow, MD Performed by: Barnet Glasgow, MD Authorized by: Barnet Glasgow, MD   Preanesthetic Checklist Completed: patient identified, IV checked, risks and benefits discussed, surgical consent, monitors and equipment checked, pre-op evaluation and timeout performed Spinal Block Patient position: sitting Prep: DuraPrep and site prepped and draped Patient monitoring: heart rate, cardiac monitor, continuous pulse ox and blood pressure Approach: midline Location: L3-4 Injection technique: single-shot Needle Needle type: Pencan  Needle gauge: 24 G Needle length: 10 cm Needle insertion depth: 5 cm Assessment Sensory level: T4 Events: CSF return Additional Notes  1 Attempt (s). Pt tolerated procedure well.

## 2021-09-01 DIAGNOSIS — Z7982 Long term (current) use of aspirin: Secondary | ICD-10-CM | POA: Diagnosis not present

## 2021-09-01 DIAGNOSIS — I509 Heart failure, unspecified: Secondary | ICD-10-CM | POA: Diagnosis not present

## 2021-09-01 DIAGNOSIS — M1712 Unilateral primary osteoarthritis, left knee: Secondary | ICD-10-CM | POA: Diagnosis not present

## 2021-09-01 DIAGNOSIS — Z950 Presence of cardiac pacemaker: Secondary | ICD-10-CM | POA: Diagnosis not present

## 2021-09-01 DIAGNOSIS — Z96611 Presence of right artificial shoulder joint: Secondary | ICD-10-CM | POA: Diagnosis not present

## 2021-09-01 DIAGNOSIS — I110 Hypertensive heart disease with heart failure: Secondary | ICD-10-CM | POA: Diagnosis not present

## 2021-09-01 LAB — BASIC METABOLIC PANEL
Anion gap: 5 (ref 5–15)
Anion gap: 7 (ref 5–15)
BUN: 35 mg/dL — ABNORMAL HIGH (ref 8–23)
BUN: 36 mg/dL — ABNORMAL HIGH (ref 8–23)
CO2: 26 mmol/L (ref 22–32)
CO2: 25 mmol/L (ref 22–32)
Calcium: 7.8 mg/dL — ABNORMAL LOW (ref 8.9–10.3)
Calcium: 7.8 mg/dL — ABNORMAL LOW (ref 8.9–10.3)
Chloride: 111 mmol/L (ref 98–111)
Chloride: 109 mmol/L (ref 98–111)
Creatinine, Ser: 1.33 mg/dL — ABNORMAL HIGH (ref 0.44–1.00)
Creatinine, Ser: 1.23 mg/dL — ABNORMAL HIGH (ref 0.44–1.00)
GFR, Estimated: 38 mL/min — ABNORMAL LOW (ref >60)
GFR, Estimated: 42 mL/min — ABNORMAL LOW (ref >60)
Glucose, Bld: 96 mg/dL (ref 70–99)
Glucose, Bld: 114 mg/dL — ABNORMAL HIGH (ref 70–99)
Potassium: 3 mmol/L — ABNORMAL LOW (ref 3.5–5.1)
Potassium: 3.5 mmol/L (ref 3.5–5.1)
Sodium: 142 mmol/L (ref 135–145)
Sodium: 141 mmol/L (ref 135–145)

## 2021-09-01 LAB — CBC
HCT: 30.2 % — ABNORMAL LOW (ref 36.0–46.0)
Hemoglobin: 9.5 g/dL — ABNORMAL LOW (ref 12.0–15.0)
MCH: 34.4 pg — ABNORMAL HIGH (ref 26.0–34.0)
MCHC: 31.5 g/dL (ref 30.0–36.0)
MCV: 109.4 fL — ABNORMAL HIGH (ref 80.0–100.0)
Platelets: 226 10*3/uL (ref 150–400)
RBC: 2.76 MIL/uL — ABNORMAL LOW (ref 3.87–5.11)
RDW: 14.6 % (ref 11.5–15.5)
WBC: 4.4 10*3/uL (ref 4.0–10.5)
nRBC: 0 % (ref 0.0–0.2)

## 2021-09-01 MED ORDER — POTASSIUM CHLORIDE CRYS ER 20 MEQ PO TBCR
40.0000 meq | EXTENDED_RELEASE_TABLET | Freq: Two times a day (BID) | ORAL | Status: AC
  Administered 2021-09-01 (×2): 40 meq via ORAL
  Filled 2021-09-01 (×2): qty 2

## 2021-09-01 NOTE — Progress Notes (Signed)
     Subjective:  Patient reports pain as mild.  Did well overnight with no issues. Eager to work with PT again today. Denies distal n/t.  Objective:   VITALS:   Vitals:   08/31/21 1625 08/31/21 2125 09/01/21 0206 09/01/21 0626  BP: (!) 149/91 (!) 102/56 119/77 117/64  Pulse:  92 86 98  Resp:  $Remo'17 17 17  'uWcNc$ Temp:  (!) 97.4 F (36.3 C) 97.9 F (36.6 C) 97.7 F (36.5 C)  TempSrc:  Oral Oral Oral  SpO2: 94% 99% 100% 93%  Weight:      Height:        Sensation intact distally Intact pulses distally Dorsiflexion/Plantar flexion intact Incision: dressing C/D/I Compartment soft   Lab Results  Component Value Date   WBC 4.4 09/01/2021   HGB 9.5 (L) 09/01/2021   HCT 30.2 (L) 09/01/2021   MCV 109.4 (H) 09/01/2021   PLT 226 09/01/2021   BMET    Component Value Date/Time   NA 142 09/01/2021 0400   NA 141 06/21/2021 1510   K 3.0 (L) 09/01/2021 0400   CL 111 09/01/2021 0400   CO2 26 09/01/2021 0400   GLUCOSE 96 09/01/2021 0400   BUN 35 (H) 09/01/2021 0400   BUN 50 (H) 06/21/2021 1510   CREATININE 1.33 (H) 09/01/2021 0400   CALCIUM 7.8 (L) 09/01/2021 0400   EGFR 33 (L) 06/21/2021 1510   GFRNONAA 38 (L) 09/01/2021 0400      Xray: Postop x-rays demonstrate total knee arthroplasty components excellent position without adverse features.  Assessment/Plan: 1 Day Post-Op   Principal Problem:   Localized osteoarthritis of left knee  S/p L TKA 6/14  Low potassium and increased creatinine. Oral potassium ordered for this AM and toradol stopped. Will recheck this afternoon, if not improved will keep patient another night and recheck in the AM.  Post op recs: WB: WBAT Abx: ancef x23 hours post op, extended abx with cefadroxil ordered 500BID x7 days, speaking to family they do believe now that her prior R TKA revision may have been with PJI which was different then what patient had told me prior given this hx recommend extended abx. Imaging: PACU xrays DVT prophylaxis:  Aspirin $Remove'81mg'tWRFCiK$  BID x4 weeks Follow up: 2 weeks after surgery for a wound check with Dr. Zachery Dakins at [facility].  Address: [address] [place], [place], [place] [address]  Office Phone: [phone]   Charlies Constable, MD Orthopaedic Surgery      Lester Crickenberger A Gerell Fortson 09/01/2021, 7:12 AM   Charlies Constable, MD  Contact information:   [phone] 7am-5pm epic message Dr. Zachery Dakins, or call office for patient follow up: [phone] After hours and holidays please check [email or web address] for group call information for [facility]

## 2021-09-01 NOTE — TOC Transition Note (Signed)
Transition of Care [facility]) - CM/SW Discharge Note  Patient Details  Name: [name] MRN: [id number] Date of Birth: [birth year]  [facility]) CM/SW Contact:  Sherie Don, LCSW Phone Number: 09/01/2021, 10:13 AM  Clinical Narrative: Patient is expected to discharge home after working with PT. CSW met with patient to confirm discharge plan. Patient will go home with HHPT through [facility] for 2 weeks, but patient reported she does not have a ride to [place] [facility] [employer] stating, "I've already used up my resources." CSW recommended that patient follow up with orthopedist's office and family regarding OPPT.  Patient will need a youth rolling walker, which [employer] delivered to her room. TOC signing off.  Final next level of care: [place] Barriers to Discharge: No Barriers Identified  Patient Goals and CMS Choice Patient states their goals for this hospitalization and ongoing recovery are:: Discharge home with [facility] CMS Medicare.gov Compare Post Acute Care list provided to:: Patient Choice offered to / list presented to : Patient  Discharge Plan and Services         DME Arranged: [facility]ford Rile youth DME Agency: [employer] Date DME Agency Contacted: 09/01/21 Representative spoke with at DME Agency: [facility] HH Arranged: PT [place] Agency: [place] Representative spoke with at [place]: Prearranged in orthopedist's office  Readmission Risk Interventions     No data to display

## 2021-09-01 NOTE — Progress Notes (Signed)
Physical Therapy Treatment Patient Details Name: [name] MRN: [id number] DOB: 08-16-1931 Today's Date: 09/01/2021   History of Present Illness Pt is an 88yo female presenting s.p L-TKA on 08/31/21. PMH: Afib, HTN, back pain, CHF, CAD, GERD, opioid dependence, OA, pacemaker, R-RTS, R-TKA, back surgery.    PT Comments    Pt seen POD1 for first of two sessions.  Pt supine in bed and agreeable to be seen, reporting she would like to take her pain medication prior to mobilizing, RN provided at beginning of session. Pt required supervision for bed mobility, min guard for transfers and ambulation in hallway with RW and +2 for recliner follow for safety. Demonstrated flexed posture with forearms on RW handgrips, reporting secondary to cervical nerve compression that limits her shoulder flexion; pt demonstrated safe ambulation with this compensation. Pt in recliner upon exit with ice applied, encouraged completion of ankle pumps. We will follow acutely.     Recommendations for follow up therapy are one component of a multi-disciplinary discharge planning process, led by the attending physician.  Recommendations may be updated based on patient status, additional functional criteria and insurance authorization.  Follow Up Recommendations  Follow physician's recommendations for discharge plan and follow up therapies     Assistance Recommended at Discharge Intermittent Supervision/Assistance  Patient can return home with the following A little help with walking and/or transfers;A little help with bathing/dressing/bathroom;Assistance with cooking/housework;Assist for transportation;Help with stairs or ramp for entrance   Equipment Recommendations  Rolling walker (2 wheels) (Pt reports she has one but it is very old.)    Recommendations for Other Services       Precautions / Restrictions Precautions Precautions: Fall Restrictions Weight Bearing Restrictions: No Other Position/Activity  Restrictions: wbat     Mobility  Bed Mobility Overal bed mobility: Needs Assistance Bed Mobility: Supine to Sit     Supine to sit: Supervision     General bed mobility comments: For safety only.    Transfers Overall transfer level: Needs assistance Equipment used: Rolling walker (2 wheels) Transfers: Sit to/from Stand Sit to Stand: Min guard, From elevated surface           General transfer comment: Sit to stand: Pt min guard for safety only, no physical assist (pt requesting no assist "now don't help me up, don't tug on me!"). Pt standing with trunk flexed forward, resting forearms on RW and unable to stand upright, reports secondary to cervical nerve compression.    Ambulation/Gait Ambulation/Gait assistance: +2 safety/equipment, Min guard Gait Distance (Feet): 60 Feet Assistive device: Rolling walker (2 wheels) Gait Pattern/deviations: Step-to pattern Gait velocity: decreased     General Gait Details: Pt ambulated 63f with RW and min guard, no physical assist required, +2 for recliner follow for safety, no overt LOB noted. Pt ambulated with forearms on RW and highly flexed trunk, reported secondary to nerve compression, unable to stand upright and put hands on RW grips.   Stairs             Wheelchair Mobility    Modified Rankin (Stroke Patients Only)       Balance Overall balance assessment: Needs assistance Sitting-balance support: Feet supported, No upper extremity supported Sitting balance-Leahy Scale: Good Sitting balance - Comments: Pt sat with hands in lap to tell story about her animals.   Standing balance support: Reliant on assistive device for balance, During functional activity, Bilateral upper extremity supported Standing balance-Leahy Scale: Poor Standing balance comment: pt with very poor standing posture, very  flexed, forearms resting on RW and unable or unwilling to stand upright.                            Cognition  Arousal/Alertness: Awake/alert Behavior During Therapy: WFL for tasks assessed/performed Overall Cognitive Status: Within Functional Limits for tasks assessed                                          Exercises Total Joint Exercises Ankle Circles/Pumps: AROM, Both, 10 reps    General Comments        Pertinent Vitals/Pain Pain Assessment Pain Assessment: 0-10 Pain Score: 10-Worst pain ever Pain Location: left knee Pain Descriptors / Indicators: Operative site guarding, Cramping, Discomfort Pain Intervention(s): Limited activity within patient's tolerance, Monitored during session, Premedicated before session, Repositioned, Ice applied    Home Living                          Prior Function            PT Goals (current goals can now be found in the care plan section) Acute Rehab PT Goals Patient Stated Goal: To remain as independent as possible. PT Goal Formulation: With patient Time For Goal Achievement: 09/07/21 Potential to Achieve Goals: Good Progress towards PT goals: Progressing toward goals    Frequency    7X/week      PT Plan Current plan remains appropriate    Co-evaluation              AM-PAC PT "6 Clicks" Mobility   Outcome Measure  Help needed turning from your back to your side while in a flat bed without using bedrails?: None Help needed moving from lying on your back to sitting on the side of a flat bed without using bedrails?: None Help needed moving to and from a bed to a chair (including a wheelchair)?: A Little Help needed standing up from a chair using your arms (e.g., wheelchair or bedside chair)?: A Little Help needed to walk in hospital room?: A Lot Help needed climbing 3-5 steps with a railing? : A Little 6 Click Score: 19    End of Session Equipment Utilized During Treatment: Gait belt Activity Tolerance: Patient tolerated treatment well Patient left: in chair;with call bell/phone within reach;with  chair alarm set Nurse Communication: Mobility status;Patient requests pain meds PT Visit Diagnosis: Pain;Difficulty in walking, not elsewhere classified (R26.2) Pain - Right/Left: Left Pain - part of body: Knee     Time: 7654-6503 PT Time Calculation (min) (ACUTE ONLY): 32 min  Charges:  $Gait Training: 23-37 mins                     Coolidge Breeze, PT, DPT [facility] Office: [phone] Pager: [phone]   Coolidge Breeze 09/01/2021, 9:42 AM

## 2021-09-01 NOTE — Progress Notes (Signed)
Physical Therapy Treatment Patient Details Name: [name] MRN: [id number] DOB: [birth year] Today's Date: 09/01/2021   History of Present Illness Pt is an 88yo female presenting s.p L-TKA on 08/31/21. PMH: Afib, HTN, back pain, CHF, CAD, GERD, opioid dependence, OA, pacemaker, R-RTS, R-TKA, back surgery.    PT Comments    Pt seen for second session POD1.  Pt seated in recliner waiting on her lunch, agreeable to be seen. Provided HEP, pt demonstrated good form with multimodal cuing and use of gait belt to provide active-assistive range of motion. Answered pt's questions regarding exercises. Pt ambulated in hallway ~71f with min guard assist, pt is progressing well but ambulation remains limited by pain. We will continue to follow acutely.    Recommendations for follow up therapy are one component of a multi-disciplinary discharge planning process, led by the attending physician.  Recommendations may be updated based on patient status, additional functional criteria and insurance authorization.  Follow Up Recommendations  Follow physician's recommendations for discharge plan and follow up therapies     Assistance Recommended at Discharge Intermittent Supervision/Assistance  Patient can return home with the following A little help with walking and/or transfers;A little help with bathing/dressing/bathroom;Assistance with cooking/housework;Assist for transportation;Help with stairs or ramp for entrance   Equipment Recommendations  Rolling walker (2 wheels) (Pt reports she has one but it is very old.)    Recommendations for Other Services       Precautions / Restrictions Precautions Precautions: Fall Restrictions Weight Bearing Restrictions: No Other Position/Activity Restrictions: wbat     Mobility  Bed Mobility Overal bed mobility: Needs Assistance Bed Mobility: Supine to Sit     Supine to sit: Supervision     General bed mobility comments: In recliner at entry and  exit    Transfers Overall transfer level: Needs assistance Equipment used: Rolling walker (2 wheels) Transfers: Sit to/from Stand Sit to Stand: Min guard           General transfer comment: Sit to stand: Pt min guard for safety only, no physical assist.    Ambulation/Gait Ambulation/Gait assistance: Min guard Gait Distance (Feet): 40 Feet Assistive device: Rolling walker (2 wheels) Gait Pattern/deviations: Step-to pattern Gait velocity: decreased     General Gait Details: Pt ambulated 89fwith RW and min guard, no physical assist required, no overt LOB noted. Pt ambulated with forearms on RW and highly flexed trunk, reported secondary to nerve compression, unable to stand upright and put hands on RW grips.   Stairs             Wheelchair Mobility    Modified Rankin (Stroke Patients Only)       Balance Overall balance assessment: Needs assistance Sitting-balance support: Feet supported, No upper extremity supported Sitting balance-Leahy Scale: Good Sitting balance - Comments: Pt sat with hands in lap to tell story about her animals.   Standing balance support: Reliant on assistive device for balance, During functional activity, Bilateral upper extremity supported Standing balance-Leahy Scale: Poor Standing balance comment: pt with very poor standing posture, very flexed, forearms resting on RW and unable or unwilling to stand upright.                            Cognition Arousal/Alertness: Awake/alert Behavior During Therapy: WFL for tasks assessed/performed Overall Cognitive Status: Within Functional Limits for tasks assessed  Exercises Total Joint Exercises Ankle Circles/Pumps: AROM, Both, 10 reps Quad Sets: AROM, Left, 10 reps, Seated Short Arc Quad: AROM, Left, 10 reps, Seated Heel Slides: AAROM, Left, 10 reps, Seated Hip ABduction/ADduction: AROM, Left, 10 reps, Seated Straight  Leg Raises: AROM, Left, 10 reps, Seated    General Comments        Pertinent Vitals/Pain Pain Assessment Pain Assessment: 0-10 Pain Score: 10-Worst pain ever Pain Location: left knee Pain Descriptors / Indicators: Operative site guarding, Cramping, Discomfort Pain Intervention(s): Limited activity within patient's tolerance, Monitored during session, Premedicated before session, Repositioned, Ice applied    Home Living                          Prior Function            PT Goals (current goals can now be found in the care plan section) Acute Rehab PT Goals Patient Stated Goal: To remain as independent as possible. PT Goal Formulation: With patient Time For Goal Achievement: 09/07/21 Potential to Achieve Goals: Good    Frequency    7X/week      PT Plan Current plan remains appropriate    Co-evaluation              AM-PAC PT "6 Clicks" Mobility   Outcome Measure  Help needed turning from your back to your side while in a flat bed without using bedrails?: None Help needed moving from lying on your back to sitting on the side of a flat bed without using bedrails?: None Help needed moving to and from a bed to a chair (including a wheelchair)?: A Little Help needed standing up from a chair using your arms (e.g., wheelchair or bedside chair)?: A Little Help needed to walk in hospital room?: A Lot Help needed climbing 3-5 steps with a railing? : A Little 6 Click Score: 19    End of Session Equipment Utilized During Treatment: Gait belt Activity Tolerance: Patient tolerated treatment well Patient left: in chair;with call bell/phone within reach;with chair alarm set Nurse Communication: Mobility status;Patient requests pain meds PT Visit Diagnosis: Pain;Difficulty in walking, not elsewhere classified (R26.2) Pain - Right/Left: Left Pain - part of body: Knee     Time: 4765-4650 PT Time Calculation (min) (ACUTE ONLY): 18 min  Charges:  $Therapeutic  Exercise: 8-22 mins                    Coolidge Breeze, PT, DPT [facility] Office: [phone] Pager: [phone]   Coolidge Breeze 09/01/2021, 1:43 PM

## 2021-09-02 DIAGNOSIS — Z950 Presence of cardiac pacemaker: Secondary | ICD-10-CM | POA: Diagnosis not present

## 2021-09-02 DIAGNOSIS — I110 Hypertensive heart disease with heart failure: Secondary | ICD-10-CM | POA: Diagnosis not present

## 2021-09-02 DIAGNOSIS — Z96611 Presence of right artificial shoulder joint: Secondary | ICD-10-CM | POA: Diagnosis not present

## 2021-09-02 DIAGNOSIS — Z7982 Long term (current) use of aspirin: Secondary | ICD-10-CM | POA: Diagnosis not present

## 2021-09-02 DIAGNOSIS — M1712 Unilateral primary osteoarthritis, left knee: Secondary | ICD-10-CM | POA: Diagnosis not present

## 2021-09-02 DIAGNOSIS — I509 Heart failure, unspecified: Secondary | ICD-10-CM | POA: Diagnosis not present

## 2021-09-02 LAB — BASIC METABOLIC PANEL
Anion gap: 9 (ref 5–15)
BUN: 37 mg/dL — ABNORMAL HIGH (ref 8–23)
CO2: 25 mmol/L (ref 22–32)
Calcium: 8 mg/dL — ABNORMAL LOW (ref 8.9–10.3)
Chloride: 105 mmol/L (ref 98–111)
Creatinine, Ser: 1.16 mg/dL — ABNORMAL HIGH (ref 0.44–1.00)
GFR, Estimated: 45 mL/min — ABNORMAL LOW (ref >60)
Glucose, Bld: 120 mg/dL — ABNORMAL HIGH (ref 70–99)
Potassium: 4.5 mmol/L (ref 3.5–5.1)
Sodium: 139 mmol/L (ref 135–145)

## 2021-09-02 LAB — CBC
HCT: 30 % — ABNORMAL LOW (ref 36.0–46.0)
Hemoglobin: 9.5 g/dL — ABNORMAL LOW (ref 12.0–15.0)
MCH: 34.8 pg — ABNORMAL HIGH (ref 26.0–34.0)
MCHC: 31.7 g/dL (ref 30.0–36.0)
MCV: 109.9 fL — ABNORMAL HIGH (ref 80.0–100.0)
Platelets: 240 10*3/uL (ref 150–400)
RBC: 2.73 MIL/uL — ABNORMAL LOW (ref 3.87–5.11)
RDW: 14.7 % (ref 11.5–15.5)
WBC: 5.8 10*3/uL (ref 4.0–10.5)
nRBC: 0 % (ref 0.0–0.2)

## 2021-09-02 MED ORDER — CEFADROXIL 500 MG PO CAPS: 500.0000 mg | ORAL_CAPSULE | Freq: Two times a day (BID) | ORAL | 0 refills | Status: AC

## 2021-09-02 MED ORDER — ASPIRIN EC 81 MG PO TBEC: 81.0000 mg | DELAYED_RELEASE_TABLET | Freq: Two times a day (BID) | ORAL | Status: AC

## 2021-09-02 MED ORDER — CEFADROXIL 500 MG PO CAPS
500.0000 mg | ORAL_CAPSULE | Freq: Two times a day (BID) | ORAL | Status: DC
  Administered 2021-09-02: 500 mg via ORAL
  Filled 2021-09-02: qty 1

## 2021-09-02 MED ORDER — ONDANSETRON HCL 4 MG PO TABS: 4.0000 mg | ORAL_TABLET | Freq: Three times a day (TID) | ORAL | 0 refills | Status: DC | PRN

## 2021-09-02 MED ORDER — OXYCODONE-ACETAMINOPHEN 10-325 MG PO TABS: 1.0000 | ORAL_TABLET | ORAL | 0 refills | Status: DC | PRN

## 2021-09-02 MED ORDER — CELECOXIB 100 MG PO CAPS: 100.0000 mg | ORAL_CAPSULE | Freq: Two times a day (BID) | ORAL | 0 refills | Status: DC

## 2021-09-02 NOTE — Discharge Summary (Signed)
Physician Discharge Summary  Patient ID: ISYSS ESPINAL MRN: [id number] DOB/AGE: Jul 25, 1931 88 y.o.  Admit date: 08/31/2021 Discharge date: 09/02/2021  Admission Diagnoses:  Localized osteoarthritis of left knee  Discharge Diagnoses:  Principal Problem:   Localized osteoarthritis of left knee   Past Medical History:  Diagnosis Date   A-fib ([place])    Anemia    Aortic valve disorder    Arthritis    osteoarthritis - back and knee   Benign hypertensive heart disease without heart failure    Blood transfusion    Body mass index (BMI) 27.0-27.9, adult 01/01/2020   Carpal tunnel syndrome 02/19/2018   Cataract    bilateral   Chest pain at rest 08/18/2014   Chronic atrial fibrillation ([place]) 07/12/2017   Chronic back pain    Chronic thoracic back pain 04/17/2013   Congestive heart failure (CHF) ([place]) 06/03/2020   Coronary artery disease    Degeneration of lumbosacral intervertebral disc 03/31/2014   Dilated cardiomyopathy ([place]) 09/17/2017   Displacement of cervical intervertebral disc without myelopathy 10/22/2014   Displacement of lumbar intervertebral disc without myelopathy 09/24/2012   Diverticulosis    Essential hypertension 10/06/2013   GERD (gastroesophageal reflux disease)    Hematoma 11/04/2013   History of stomach ulcers    Hyperlipidemia    Hypertension    Impingement syndrome of shoulder region 05/17/2014   Lumbar foraminal stenosis 04/19/2020   Neck pain 10/22/2014   Opioid dependence ([place]) 04/19/2020   Osteoarthrosis    Other long term (current) drug therapy 04/19/2020   Pacemaker    Pacemaker reprogramming/check 08/18/2014   medtronic  Device implantedSeptember 2012 by Angie Fava of this note might be different from the original. Overview:  Overview:  medtronic  Device implantedSeptember 2012 by Agustin Cree   Pain in upper limb 04/27/2015   Paroxysmal atrial fibrillation ([place]) 08/18/2014   Formatting of this note might be different from the  original. Not anticoagulated secondary to intracranial bleed while on coumadin   Permanent atrial fibrillation ([place]) 02/11/2019   Pneumonia    Rotator cuff tear arthropathy of right shoulder 11/16/2015   S/P shoulder replacement 11/16/2015   Shoulder impingement 05/17/2014   Skin cancer of arm    L arm   Spinal stenosis 03/31/2014   Status post lumbar surgery    Vitamin D deficiency     Surgeries: Procedure(s): TOTAL KNEE ARTHROPLASTY on 08/31/2021   Consultants (if any):   Discharged Condition: Improved  Hospital Course: ANESSA CHARLEY is an 88 y.o. female who was admitted 08/31/2021 with a diagnosis of Localized osteoarthritis of left knee and went to the operating room on 08/31/2021 and underwent the above named procedures.    She was given perioperative antibiotics:  Anti-infectives (From admission, onward)    Start     Dose/Rate Route Frequency Ordered Stop   09/02/21 1000  cefadroxil (DURICEF) capsule 500 mg        500 mg Oral 2 times daily 09/02/21 0642 09/09/21 0959   09/02/21 0000  cefadroxil (DURICEF) 500 MG capsule        500 mg Oral 2 times daily 09/02/21 0643 09/09/21 2359   08/31/21 1700  ceFAZolin (ANCEF) IVPB 2g/100 mL premix        2 g 200 mL/hr over 30 Minutes Intravenous Every 6 hours 08/31/21 1551 08/31/21 2256   08/31/21 1045  ceFAZolin (ANCEF) IVPB 2g/100 mL premix        2 g 200 mL/hr over 30 Minutes Intravenous  Once 08/31/21  1036 08/31/21 1106   08/31/21 0830  vancomycin (VANCOCIN) IVPB 1000 mg/200 mL premix  Status:  Discontinued        1,000 mg 200 mL/hr over 60 Minutes Intravenous On call to O.R. 08/31/21 0820 08/31/21 1036     .  She was given sequential compression devices, early ambulation, and aspirin for DVT prophylaxis.  She benefited maximally from the hospital stay and there were no complications.    Recent vital signs:  Vitals:   09/01/21 2102 09/02/21 0500  BP: 138/69 136/67  Pulse: (!) 105 72  Resp: 16 16  Temp: 98.5 F (36.9  C) 97.8 F (36.6 C)  SpO2: 100% 96%    Recent laboratory studies:  Lab Results  Component Value Date   HGB 9.5 (L) 09/02/2021   HGB 9.5 (L) 09/01/2021   HGB 11.0 (L) 08/23/2021   Lab Results  Component Value Date   WBC 5.8 09/02/2021   PLT 240 09/02/2021   Lab Results  Component Value Date   INR 1.01 12/26/2010   Lab Results  Component Value Date   NA 139 09/02/2021   K 4.5 09/02/2021   CL 105 09/02/2021   CO2 25 09/02/2021   BUN 37 (H) 09/02/2021   CREATININE 1.16 (H) 09/02/2021   GLUCOSE 120 (H) 09/02/2021    Discharge Medications:   Allergies as of 09/02/2021       Reactions   Levaquin [levofloxacin] Anaphylaxis   Norvasc [amlodipine] Anaphylaxis   Swelling of throat with Amlodipine; but does tolerate Tribenzor   Penicillins Hives   Tolerated Cephalosporin Date: 08/31/21.        Medication List     STOP taking these medications    ibuprofen 800 MG tablet Commonly known as: ADVIL       TAKE these medications    acetaminophen 325 MG tablet Commonly known as: TYLENOL Take 650 mg by mouth every 6 (six) hours as needed (for pain).   aspirin EC 81 MG tablet Take 1 tablet (81 mg total) by mouth 2 (two) times daily for 28 days. What changed: when to take this   CALCIUM PO Take 1 tablet by mouth in the morning.   cefadroxil 500 MG capsule Commonly known as: DURICEF Take 1 capsule (500 mg total) by mouth 2 (two) times daily for 7 days.   celecoxib 100 MG capsule Commonly known as: CeleBREX Take 1 capsule (100 mg total) by mouth 2 (two) times daily for 14 days.   cholecalciferol 25 MCG (1000 UNIT) tablet Commonly known as: VITAMIN D Take 1,000 Units by mouth in the morning.   denosumab 60 MG/ML Sosy injection Commonly known as: PROLIA Inject 60 mg into the skin every 6 (six) months.   diltiazem 240 MG 24 hr capsule Commonly known as: CARDIZEM CD TAKE 1 CAPSULE BY MOUTH EVERY DAY   famotidine 40 MG tablet Commonly known as: PEPCID Take  40 mg by mouth in the morning.   KELP PO Take 1 tablet by mouth in the morning. [place] (OTC)   multivitamin tablet Take 1 tablet by mouth daily.   ondansetron 4 MG tablet Commonly known as: Zofran Take 1 tablet (4 mg total) by mouth every 8 (eight) hours as needed for up to 14 days for nausea or vomiting.   oxyCODONE-acetaminophen 10-325 MG tablet Commonly known as: PERCOCET Take 1 tablet by mouth every 4 (four) hours as needed for pain. What changed: when to take this   pantoprazole 40 MG tablet Commonly known  as: PROTONIX Take 40 mg by mouth every evening.   Potassium 99 MG Tabs Take 99 mg by mouth in the morning.   torsemide 100 MG tablet Commonly known as: DEMADEX TAKE '75MG'$  IN THE MORNING AND 50 MG IN THE EVENINGS. What changed: See the new instructions.   valsartan 160 MG tablet Commonly known as: DIOVAN Take 160 mg by mouth in the morning.   vitamin B-12 1000 MCG tablet Commonly known as: CYANOCOBALAMIN Take 1,000 mcg by mouth in the morning.   VITAMIN E PO Take 1 capsule by mouth in the morning.        Diagnostic Studies: DG Knee Left Port  Result Date: 08/31/2021 CLINICAL DATA:  [id number] status post left knee replacement EXAM: PORTABLE LEFT KNEE - 1-2 VIEW COMPARISON:  None Available. FINDINGS: AP and lateral views of the left knee are submitted for interpretation. There are post arthroplasty changes seen at the arthroplasty components are in near anatomical alignment. Soft tissue emphysema. Osteopenia. IMPRESSION: Status post right knee prosthesis and the prosthetic components are in near anatomical alignment. Soft tissue emphysema. Electronically Signed   By: Frazier Richards M.D.   On: 08/31/2021 14:21   CUP PACEART INCLINIC DEVICE CHECK  Result Date: 08/23/2021 Pacemaker check in clinic. Normal device function. Thresholds, sensing, impedances consistent with previous measurements. Device programmed to maximize longevity. HVRs are likely AF RVR, potentially  several look like short NSVT. Device programmed at appropriate safety margins. Histogram distribution somewhat right shifted. Pt refused med adjustment. Estimated longevity 2 years. Patient enrolled in remote follow-up. Patient education completed.   Disposition: Discharge disposition: 01-Home or Self Care       Discharge Instructions     Call MD / Call 911   Complete by: As directed    If you experience chest pain or shortness of breath, CALL 911 and be transported to the hospital emergency room.  If you develope a fever above 101 F, pus (white drainage) or increased drainage or redness at the wound, or calf pain, call your surgeon's office.   Constipation Prevention   Complete by: As directed    Drink plenty of fluids.  Prune juice may be helpful.  You may use a stool softener, such as Colace (over the counter) 100 mg twice a day.  Use MiraLax (over the counter) for constipation as needed.   Diet - low sodium heart healthy   Complete by: As directed    Do not put a pillow under the knee. Place it under the heel.   Complete by: As directed    Increase activity slowly as tolerated   Complete by: As directed    Post-operative opioid taper instructions:   Complete by: As directed    POST-OPERATIVE OPIOID TAPER INSTRUCTIONS: It is important to wean off of your opioid medication as soon as possible. If you do not need pain medication after your surgery it is ok to stop day one. Opioids include: Codeine, Hydrocodone(Norco, Vicodin), Oxycodone(Percocet, oxycontin) and hydromorphone amongst others.  Long term and even short term use of opiods can cause: Increased pain response Dependence Constipation Depression Respiratory depression And more.  Withdrawal symptoms can include Flu like symptoms Nausea, vomiting And more Techniques to manage these symptoms Hydrate well Eat regular healthy meals Stay active Use relaxation techniques(deep breathing, meditating, yoga) Do Not  substitute Alcohol to help with tapering If you have been on opioids for less than two weeks and do not have pain than it is ok to stop all together.  Plan to wean off of opioids This plan should start within one week post op of your joint replacement. Maintain the same interval or time between taking each dose and first decrease the dose.  Cut the total daily intake of opioids by one tablet each day Next start to increase the time between doses. The last dose that should be eliminated is the evening dose.           Follow-up Information     Willaim Sheng, MD. Go on 09/13/2021.   Specialty: Orthopedic Surgery Why: Your appointment has been scheduled for 2:45 Contact information: [address] [place] [address] [phone]         [facility] Follow up.   Specialty: Home Health Services Why: HHPT will provide 6 home visits prior to starting outpatient physical therapy Contact information: [place] [address] [phone]         [facility] [place]. Go on 09/14/2021.   Why: Your outpatient physical therapy appointment is scheduled for 11:00. Contact information: [phone]                   Discharge Instructions      INSTRUCTIONS AFTER JOINT REPLACEMENT   Remove items at home which could result in a fall. This includes throw rugs or furniture in walking pathways ICE to the affected joint every three hours while awake for 30 minutes at a time, for at least the first 3-5 days, and then as needed for pain and swelling.  Continue to use ice for pain and swelling. You may notice swelling that will progress down to the foot and ankle.  This is normal after surgery.  Elevate your leg when you are not up walking on it.   Continue to use the breathing machine you got in the hospital (incentive spirometer) which will help keep your temperature down.  It is common for your temperature to cycle up and  down following surgery, especially at night when you are not up moving around and exerting yourself.  The breathing machine keeps your lungs expanded and your temperature down.   DIET:  As you were doing prior to hospitalization, we recommend a well-balanced diet.  DRESSING / WOUND CARE / SHOWERING  Keep the surgical dressing until follow up.  The dressing is water proof, so you can shower without any extra covering.  IF THE DRESSING FALLS OFF or the wound gets wet inside, change the dressing with sterile gauze.  Please use good hand washing techniques before changing the dressing.  Do not use any lotions or creams on the incision until instructed by your surgeon.    ACTIVITY  Increase activity slowly as tolerated, but follow the weight bearing instructions below.   No driving for 6 weeks or until further direction given by your physician.  You cannot drive while taking narcotics.  No lifting or carrying greater than 10 lbs. until further directed by your surgeon. Avoid periods of inactivity such as sitting longer than an hour when not asleep. This helps prevent blood clots.  You may return to work once you are authorized by your doctor.     WEIGHT BEARING   Weight bearing as tolerated with assist device (walker, cane, etc) as directed, use it as long as suggested by your surgeon or therapist, typically at least 4-6 weeks.   EXERCISES  Results after joint replacement surgery are often greatly improved when you follow the  exercise, range of motion and muscle strengthening exercises prescribed by your doctor. Safety measures are also important to protect the joint from further injury. Any time any of these exercises cause you to have increased pain or swelling, decrease what you are doing until you are comfortable again and then slowly increase them. If you have problems or questions, call your caregiver or physical therapist for advice.   Rehabilitation is important following a joint  replacement. After just a few days of immobilization, the muscles of the leg can become weakened and shrink (atrophy).  These exercises are designed to build up the tone and strength of the thigh and leg muscles and to improve motion. Often times heat used for twenty to thirty minutes before working out will loosen up your tissues and help with improving the range of motion but do not use heat for the first two weeks following surgery (sometimes heat can increase post-operative swelling).   These exercises can be done on a training (exercise) mat, on the floor, on a table or on a bed. Use whatever works the best and is most comfortable for you.    Use music or television while you are exercising so that the exercises are a pleasant break in your day. This will make your life better with the exercises acting as a break in your routine that you can look forward to.   Perform all exercises about fifteen times, three times per day or as directed.  You should exercise both the operative leg and the other leg as well.  Exercises include:   Quad Sets - Tighten up the muscle on the front of the thigh (Quad) and hold for 5-10 seconds.   Straight Leg Raises - With your knee straight (if you were given a brace, keep it on), lift the leg to 60 degrees, hold for 3 seconds, and slowly lower the leg.  Perform this exercise against resistance later as your leg gets stronger.  Leg Slides: Lying on your back, slowly slide your foot toward your buttocks, bending your knee up off the floor (only go as far as is comfortable). Then slowly slide your foot back down until your leg is flat on the floor again.  Angel Wings: Lying on your back spread your legs to the side as far apart as you can without causing discomfort.  Hamstring Strength:  Lying on your back, push your heel against the floor with your leg straight by tightening up the muscles of your buttocks.  Repeat, but this time bend your knee to a comfortable angle, and  push your heel against the floor.  You may put a pillow under the heel to make it more comfortable if necessary.   A rehabilitation program following joint replacement surgery can speed recovery and prevent re-injury in the future due to weakened muscles. Contact your doctor or a physical therapist for more information on knee rehabilitation.    CONSTIPATION  Constipation is defined medically as fewer than three stools per week and severe constipation as less than one stool per week.  Even if you have a regular bowel pattern at home, your normal regimen is likely to be disrupted due to multiple reasons following surgery.  Combination of anesthesia, postoperative narcotics, change in appetite and fluid intake all can affect your bowels.   YOU MUST use at least one of the following options; they are listed in order of increasing strength to get the job done.  They are all available over the counter,  and you may need to use some, POSSIBLY even all of these options:    Drink plenty of fluids (prune juice may be helpful) and high fiber foods Colace 100 mg by mouth twice a day  Senokot for constipation as directed and as needed Dulcolax (bisacodyl), take with full glass of water  Miralax (polyethylene glycol) once or twice a day as needed.  If you have tried all these things and are unable to have a bowel movement in the first 3-4 days after surgery call either your surgeon or your primary doctor.    If you experience loose stools or diarrhea, hold the medications until you stool forms back up.  If your symptoms do not get better within 1 week or if they get worse, check with your doctor.  If you experience "the worst abdominal pain ever" or develop nausea or vomiting, please contact the office immediately for further recommendations for treatment.   ITCHING:  If you experience itching with your medications, try taking only a single pain pill, or even half a pain pill at a time.  You can also use  Benadryl over the counter for itching or also to help with sleep.   TED HOSE STOCKINGS:  Use stockings on both legs until for at least 2 weeks or as directed by physician office. They may be removed at night for sleeping.  MEDICATIONS:  See your medication summary on the "After Visit Summary" that nursing will review with you.  You may have some home medications which will be placed on hold until you complete the course of blood thinner medication.  It is important for you to complete the blood thinner medication as prescribed.   Blood clot prevention (DVT Prophylaxis): After surgery you are at an increased risk for a blood clot. you were prescribed a blood thinner, Aspirin '81mg'$ , to be taken twice daily for a total of 4 weeks from surgery to help reduce your risk of getting a blood clot. This will help prevent a blood clot. Signs of a pulmonary embolus (blood clot in the lungs) include sudden short of breath, feeling lightheaded or dizzy, chest pain with a deep breath, rapid pulse rapid breathing. Signs of a blood clot in your arms or legs include new unexplained swelling and cramping, warm, red or darkened skin around the painful area. Please call the office or 911 right away if these signs or symptoms develop.  PRECAUTIONS:  If you experience chest pain or shortness of breath - call 911 immediately for transfer to the hospital emergency department.   If you develop a fever greater that 101 F, purulent drainage from wound, increased redness or drainage from wound, foul odor from the wound/dressing, or calf pain - CONTACT YOUR SURGEON.                                                   FOLLOW-UP APPOINTMENTS:  If you do not already have a post-op appointment, please call the office for an appointment to be seen by your surgeon.  Guidelines for how soon to be seen are listed in your "After Visit Summary", but are typically between 2-3 weeks after surgery.    POST-OPERATIVE OPIOID TAPER  INSTRUCTIONS: It is important to wean off of your opioid medication as soon as possible. If you do not need pain medication after your surgery  it is ok to stop day one. Opioids include: Codeine, Hydrocodone(Norco, Vicodin), Oxycodone(Percocet, oxycontin) and hydromorphone amongst others.  Long term and even short term use of opiods can cause: Increased pain response Dependence Constipation Depression Respiratory depression And more.  Withdrawal symptoms can include Flu like symptoms Nausea, vomiting And more Techniques to manage these symptoms Hydrate well Eat regular healthy meals Stay active Use relaxation techniques(deep breathing, meditating, yoga) Do Not substitute Alcohol to help with tapering If you have been on opioids for less than two weeks and do not have pain than it is ok to stop all together.  Plan to wean off of opioids This plan should start within one week post op of your joint replacement. Maintain the same interval or time between taking each dose and first decrease the dose.  Cut the total daily intake of opioids by one tablet each day Next start to increase the time between doses. The last dose that should be eliminated is the evening dose.   MAKE SURE YOU:  Understand these instructions.  Get help right away if you are not doing well or get worse.    Thank you for letting us be a part of your medical care team.  It is a privilege we respect greatly.  We hope these instructions will help you stay on track for a fast and full recovery!            Signed: Telisha Zawadzki A Marylin Lathon 09/02/2021, 6:44 AM

## 2021-09-02 NOTE — Plan of Care (Signed)
  Problem: Education: Goal: Knowledge of General Education information will improve Description: Including pain rating scale, medication(s)/side effects and non-pharmacologic comfort measures Outcome: Adequate for Discharge   Problem: Health Behavior/Discharge Planning: Goal: Ability to manage health-related needs will improve Outcome: Adequate for Discharge   Problem: Clinical Measurements: Goal: Ability to maintain clinical measurements within normal limits will improve Outcome: Adequate for Discharge Goal: Will remain free from infection Outcome: Adequate for Discharge Goal: Diagnostic test results will improve Outcome: Adequate for Discharge Goal: Respiratory complications will improve Outcome: Adequate for Discharge Goal: Cardiovascular complication will be avoided Outcome: Adequate for Discharge   Problem: Activity: Goal: Risk for activity intolerance will decrease Outcome: Adequate for Discharge   Problem: Nutrition: Goal: Adequate nutrition will be maintained Outcome: Adequate for Discharge   Problem: Coping: Goal: Level of anxiety will decrease Outcome: Adequate for Discharge   Problem: Elimination: Goal: Will not experience complications related to bowel motility Outcome: Adequate for Discharge Goal: Will not experience complications related to urinary retention Outcome: Adequate for Discharge   Problem: Pain Managment: Goal: General experience of comfort will improve Outcome: Adequate for Discharge   Problem: Safety: Goal: Ability to remain free from injury will improve Outcome: Adequate for Discharge   Problem: Skin Integrity: Goal: Risk for impaired skin integrity will decrease Outcome: Adequate for Discharge  Discharged home with daughter in law.  Discharge teaching done.  Written information given

## 2021-09-02 NOTE — Plan of Care (Signed)

## 2021-09-02 NOTE — Progress Notes (Signed)
Physical Therapy Treatment Patient Details Name: [name] MRN: [id number] DOB: [birth year] Today's Date: 09/02/2021   History of Present Illness Pt is an 88yo female presenting s.p L-TKA on 08/31/21. PMH: Afib, HTN, back pain, CHF, CAD, GERD, opioid dependence, OA, pacemaker, R-RTS, R-TKA, back surgery.    PT Comments    Pt is sleeping upon entry, agreeable to be seen. Pt required min assist to scoot hips EOB during bed mobility, min guard for transfers, min guard for ambulation in hallway with RW for 85f. Pt has met mobility goals for safe discharge home. All education has been completed and pt has no further questions. PT will sign off, if needs change please re-consult. Thank you for this referral.     Recommendations for follow up therapy are one component of a multi-disciplinary discharge planning process, led by the attending physician.  Recommendations may be updated based on patient status, additional functional criteria and insurance authorization.  Follow Up Recommendations  Follow physician's recommendations for discharge plan and follow up therapies     Assistance Recommended at Discharge Intermittent Supervision/Assistance  Patient can return home with the following A little help with walking and/or transfers;A little help with bathing/dressing/bathroom;Assistance with cooking/housework;Assist for transportation;Help with stairs or ramp for entrance   Equipment Recommendations  Rolling walker (2 wheels) (Pt reports she has one but it is very old.)    Recommendations for Other Services       Precautions / Restrictions Precautions Precautions: Fall Restrictions Weight Bearing Restrictions: No Other Position/Activity Restrictions: wbat     Mobility  Bed Mobility Overal bed mobility: Needs Assistance Bed Mobility: Supine to Sit     Supine to sit: Min assist     General bed mobility comments: Pt light min assist for scooting EOB    Transfers Overall  transfer level: Needs assistance Equipment used: Rolling walker (2 wheels) Transfers: Sit to/from Stand Sit to Stand: Min guard           General transfer comment: Sit to stand: Pt min guard for safety only, no physical assist.    Ambulation/Gait Ambulation/Gait assistance: Min guard, +2 safety/equipment Gait Distance (Feet): 40 Feet Assistive device: Rolling walker (2 wheels) Gait Pattern/deviations: Step-to pattern, Trunk flexed Gait velocity: decreased     General Gait Details: Pt ambulated 426fwith RW and min guard, no physical assist required, no overt LOB noted. Pt ambulated with forearms on RW and highly flexed trunk, reported secondary to nerve compression, unable to stand upright and put hands on RW grips. +2 for recliner follow for safety.   Stairs             Wheelchair Mobility    Modified Rankin (Stroke Patients Only)       Balance Overall balance assessment: Needs assistance Sitting-balance support: Feet supported, No upper extremity supported Sitting balance-Leahy Scale: Good Sitting balance - Comments: Pt sat with hands in lap to tell story about her animals.   Standing balance support: Reliant on assistive device for balance, During functional activity, Bilateral upper extremity supported Standing balance-Leahy Scale: Poor Standing balance comment: pt with very poor standing posture, very flexed, forearms resting on RW and unable or unwilling to stand upright.                            Cognition Arousal/Alertness: Awake/alert Behavior During Therapy: WFL for tasks assessed/performed Overall Cognitive Status: Within Functional Limits for tasks assessed  Exercises      General Comments        Pertinent Vitals/Pain Pain Assessment Pain Assessment: Faces Faces Pain Scale: Hurts even more Pain Location: left knee Pain Descriptors / Indicators: Operative site guarding,  Cramping, Discomfort Pain Intervention(s): Limited activity within patient's tolerance, Monitored during session, Patient requesting pain meds-RN notified, Ice applied    Home Living                          Prior Function            PT Goals (current goals can now be found in the care plan section) Acute Rehab PT Goals Patient Stated Goal: To remain as independent as possible. PT Goal Formulation: With patient Time For Goal Achievement: 09/07/21 Potential to Achieve Goals: Good Progress towards PT goals: Progressing toward goals    Frequency    7X/week      PT Plan Current plan remains appropriate    Co-evaluation              AM-PAC PT "6 Clicks" Mobility   Outcome Measure  Help needed turning from your back to your side while in a flat bed without using bedrails?: None Help needed moving from lying on your back to sitting on the side of a flat bed without using bedrails?: A Little Help needed moving to and from a bed to a chair (including a wheelchair)?: A Little Help needed standing up from a chair using your arms (e.g., wheelchair or bedside chair)?: A Little Help needed to walk in hospital room?: A Lot Help needed climbing 3-5 steps with a railing? : A Little 6 Click Score: 18    End of Session Equipment Utilized During Treatment: Gait belt Activity Tolerance: Patient tolerated treatment well Patient left: in chair;with call bell/phone within reach;with chair alarm set Nurse Communication: Mobility status;Patient requests pain meds PT Visit Diagnosis: Pain;Difficulty in walking, not elsewhere classified (R26.2) Pain - Right/Left: Left Pain - part of body: Knee     Time: 3833-3832 PT Time Calculation (min) (ACUTE ONLY): 23 min  Charges:  $Gait Training: 8-22 mins $Self Care/Home Management: 8-22                     Coolidge Breeze, PT, DPT [facility] Office: [phone] Pager: [phone]   Coolidge Breeze 09/02/2021, 9:41 AM

## 2021-09-02 NOTE — Progress Notes (Signed)
     Subjective:  Patient reports pain as mild.  Did well overnight with no issues. Worked well with PT yesterday. Denies distal n/t.  Objective:   VITALS:   Vitals:   09/01/21 0901 09/01/21 1352 09/01/21 2102 09/02/21 0500  BP: (!) 100/50 99/63 138/69 136/67  Pulse: 98 95 (!) 105 72  Resp: 18 18 16 16   Temp: 98 F (36.7 C) 98.1 F (36.7 C) 98.5 F (36.9 C) 97.8 F (36.6 C)  TempSrc: Oral Oral Oral Oral  SpO2: 97% 97% 100% 96%  Weight:      Height:        Sensation intact distally Intact pulses distally Dorsiflexion/Plantar flexion intact Incision: dressing C/D/I Compartment soft   Lab Results  Component Value Date   WBC 5.8 09/02/2021   HGB 9.5 (L) 09/02/2021   HCT 30.0 (L) 09/02/2021   MCV 109.9 (H) 09/02/2021   PLT 240 09/02/2021   BMET    Component Value Date/Time   NA 139 09/02/2021 0323   NA 141 06/21/2021 1510   K 4.5 09/02/2021 0323   CL 105 09/02/2021 0323   CO2 25 09/02/2021 0323   GLUCOSE 120 (H) 09/02/2021 0323   BUN 37 (H) 09/02/2021 0323   BUN 50 (H) 06/21/2021 1510   CREATININE 1.16 (H) 09/02/2021 0323   CALCIUM 8.0 (L) 09/02/2021 0323   EGFR 33 (L) 06/21/2021 1510   GFRNONAA 45 (L) 09/02/2021 0323      Xray: Postop x-rays demonstrate total knee arthroplasty components excellent position without adverse features.  Assessment/Plan: 2 Days Post-Op   Principal Problem:   Localized osteoarthritis of left knee  S/p L TKA 6/14  Low potassium and increased creatinine both improved today. Okay to discharge home.  Post op recs: WB: WBAT Abx: ancef x23 hours post op, extended abx with cefadroxil ordered 500BID x7 days, speaking to family they do believe now that her prior R TKA revision may have been with PJI which was different then what patient had told me prior given this hx recommend extended abx. Imaging: PACU xrays DVT prophylaxis: Aspirin 81mg  BID x4 weeks Follow up: 2 weeks after surgery for a wound check with Dr. Zachery Dakins at  [facility]  Address: [address] [place], [place], [place] [address]  Office Phone: [phone]   Charlies Constable, MD Orthopaedic Surgery      [name] [place] 09/02/2021, 6:43 AM   Charlies Constable, MD  Contact information:   [phone] 7am-5pm epic message Dr. Zachery Dakins, or call office for patient follow up: [phone] After hours and holidays please check [email or web address] for group call information for [facility]

## 2021-09-05 ENCOUNTER — Encounter (HOSPITAL_COMMUNITY): Payer: Self-pay | Admitting: Orthopedic Surgery

## 2021-09-13 DIAGNOSIS — M1712 Unilateral primary osteoarthritis, left knee: Secondary | ICD-10-CM | POA: Diagnosis not present

## 2021-09-15 ENCOUNTER — Encounter: Payer: Self-pay | Admitting: Cardiology

## 2021-09-15 ENCOUNTER — Ambulatory Visit (INDEPENDENT_AMBULATORY_CARE_PROVIDER_SITE_OTHER): Payer: Medicare Other | Admitting: Cardiology

## 2021-09-15 ENCOUNTER — Telehealth: Payer: Self-pay | Admitting: *Deleted

## 2021-09-15 VITALS — BP 140/62 | HR 71 | Ht 60.0 in | Wt 128.6 lb

## 2021-09-15 DIAGNOSIS — I340 Nonrheumatic mitral (valve) insufficiency: Secondary | ICD-10-CM | POA: Diagnosis not present

## 2021-09-15 DIAGNOSIS — E782 Mixed hyperlipidemia: Secondary | ICD-10-CM | POA: Diagnosis not present

## 2021-09-15 DIAGNOSIS — I4821 Permanent atrial fibrillation: Secondary | ICD-10-CM | POA: Diagnosis not present

## 2021-09-15 DIAGNOSIS — Z950 Presence of cardiac pacemaker: Secondary | ICD-10-CM

## 2021-09-15 DIAGNOSIS — R1310 Dysphagia, unspecified: Secondary | ICD-10-CM | POA: Diagnosis not present

## 2021-09-15 DIAGNOSIS — I420 Dilated cardiomyopathy: Secondary | ICD-10-CM

## 2021-09-15 NOTE — Telephone Encounter (Signed)
Faxed referral and office note to Dr. Christia Reading [name]'s office to be seen for Dysphagia

## 2021-09-15 NOTE — Progress Notes (Signed)
Cardiology Office Note:    Date:  09/15/2021   ID:  [name], [name] [birth year], MRN [id number]  PCP:  Nicoletta Dress, MD  Cardiologist:  Jenne Campus, MD    Referring MD: Nicoletta Dress, MD   Chief Complaint  Patient presents with   Follow-up    History of Present Illness:    [name] is a 88 y.o. female with past medical history significant for cardiomyopathy likely normalization, also at least moderate mitral regurgitation, permanent atrial fibrillation, she is refusing anticoagulation, pacemaker present which is Medtronic device, congestive heart failure.  She comes today 2 months for follow-up.  Recently she ended up having left elective knee replacement surgery done.  She went to surgery with no difficulties and seems to be doing well after that.  Denies having any chest pain tightness squeezing pressure burning chest no paroxysmal nocturnal dyspnea, no significant swelling of lower extremities after admit she looks good.  Past Medical History:  Diagnosis Date   A-fib ([place])    Anemia    Aortic valve disorder    Arthritis    osteoarthritis - back and knee   Benign hypertensive heart disease without heart failure    Blood transfusion    Body mass index (BMI) 27.0-27.9, adult 01/01/2020   Carpal tunnel syndrome 02/19/2018   Cataract    bilateral   Chest pain at rest 08/18/2014   Chronic atrial fibrillation ([place]) 07/12/2017   Chronic back pain    Chronic thoracic back pain 04/17/2013   Congestive heart failure (CHF) ([place]) 06/03/2020   Coronary artery disease    Degeneration of lumbosacral intervertebral disc 03/31/2014   Dilated cardiomyopathy ([place]) 09/17/2017   Displacement of cervical intervertebral disc without myelopathy 10/22/2014   Displacement of lumbar intervertebral disc without myelopathy 09/24/2012   Diverticulosis    Essential hypertension 10/06/2013   GERD (gastroesophageal reflux disease)    Hematoma 11/04/2013   History of  stomach ulcers    Hyperlipidemia    Hypertension    Impingement syndrome of shoulder region 05/17/2014   Lumbar foraminal stenosis 04/19/2020   Neck pain 10/22/2014   Opioid dependence ([place]) 04/19/2020   Osteoarthrosis    Other long term (current) drug therapy 04/19/2020   Pacemaker    Pacemaker reprogramming/check 08/18/2014   medtronic  Device implantedSeptember 2012 by Angie Fava of this note might be different from the original. Overview:  Overview:  medtronic  Device implantedSeptember 2012 by Agustin Cree   Pain in upper limb 04/27/2015   Paroxysmal atrial fibrillation ([place]) 08/18/2014   Formatting of this note might be different from the original. Not anticoagulated secondary to intracranial bleed while on coumadin   Permanent atrial fibrillation ([place]) 02/11/2019   Pneumonia    Rotator cuff tear arthropathy of right shoulder 11/16/2015   S/P shoulder replacement 11/16/2015   Shoulder impingement 05/17/2014   Skin cancer of arm    L arm   Spinal stenosis 03/31/2014   Status post lumbar surgery    Vitamin D deficiency     Past Surgical History:  Procedure Laterality Date   ABDOMINAL HYSTERECTOMY     APPENDECTOMY     BACK SURGERY     five back surgeries   cataract extaction -left     cataract extraction right     CHOLECYSTECTOMY     COLONOSCOPY     CRANIOTOMY     ? subdural hematoma?  Sustained head injury after falling while taking Coumadin   ESOPHAGOGASTRODUODENOSCOPY  EYE SURGERY Bilateral    cataract surgery with lens implant   FRACTURE SURGERY     left wrist, with plate and removal of plate   INSERT / REPLACE / REMOVE PACEMAKER     Medtronic   KNEE ARTHROPLASTY     right knee   REVERSE SHOULDER ARTHROPLASTY Right 11/16/2015   REVERSE SHOULDER ARTHROPLASTY Right 11/16/2015   Procedure: REVERSE SHOULDER ARTHROPLASTY;  Surgeon: Marchia Bond, MD;  Location: [facility];  Service: Orthopedics;  Laterality: Right;   S1 vertebroplasty     SHOULDER OPEN  ROTATOR CUFF REPAIR  right   Skin cancer removal from L arm  11/02/2020   TOTAL KNEE ARTHROPLASTY Left 08/31/2021   Procedure: TOTAL KNEE ARTHROPLASTY;  Surgeon: Willaim Sheng, MD;  Location: [facility];  Service: Orthopedics;  Laterality: Left;    Current Medications: Current Meds  Medication Sig   acetaminophen (TYLENOL) 325 MG tablet Take 650 mg by mouth every 6 (six) hours as needed (for pain).   aspirin EC 81 MG tablet Take 1 tablet (81 mg total) by mouth 2 (two) times daily for 28 days.   CALCIUM PO Take 1 tablet by mouth in the morning.   cholecalciferol (VITAMIN D) 25 MCG (1000 UNIT) tablet Take 1,000 Units by mouth in the morning.   denosumab (PROLIA) 60 MG/ML SOSY injection Inject 60 mg into the skin every 6 (six) months.   diltiazem (CARDIZEM CD) 240 MG 24 hr capsule TAKE 1 CAPSULE BY MOUTH EVERY DAY   famotidine (PEPCID) 40 MG tablet Take 40 mg by mouth in the morning.   Iodine, Kelp, (KELP PO) Take 1 tablet by mouth in the morning. Sea Kelp (OTC)   Multiple Vitamin (MULTIVITAMIN) tablet Take 1 tablet by mouth daily.   oxyCODONE-acetaminophen (PERCOCET) 10-325 MG tablet Take 1 tablet by mouth every 4 (four) hours as needed for pain.   pantoprazole (PROTONIX) 40 MG tablet Take 40 mg by mouth every evening.   Potassium 99 MG TABS Take 99 mg by mouth in the morning.   torsemide (DEMADEX) 100 MG tablet TAKE '75MG'$  IN THE MORNING AND 50 MG IN THE EVENINGS. (Patient taking differently: Take 50 mg by mouth in the morning and at bedtime.)   valsartan (DIOVAN) 160 MG tablet Take 160 mg by mouth in the morning.   vitamin B-12 (CYANOCOBALAMIN) 1000 MCG tablet Take 1,000 mcg by mouth in the morning.   VITAMIN E PO Take 1 capsule by mouth in the morning.   [DISCONTINUED] celecoxib (CELEBREX) 100 MG capsule Take 1 capsule (100 mg total) by mouth 2 (two) times daily for 14 days.   [DISCONTINUED] ondansetron (ZOFRAN) 4 MG tablet Take 1 tablet (4 mg total) by mouth every 8 (eight) hours as  needed for up to 14 days for nausea or vomiting.     Allergies:   Levaquin [levofloxacin], Norvasc [amlodipine], and Penicillins   Social History   Socioeconomic History   Marital status: Widowed    Spouse name: Not on file   Number of children: 4   Years of education: 35   Highest education level: Not on file  Occupational History   Occupation: retired  Tobacco Use   Smoking status: Never   Smokeless tobacco: Never  Vaping Use   Vaping Use: Never used  Substance and Sexual Activity   Alcohol use: No   Drug use: No   Sexual activity: Not on file  Other Topics Concern   Not on file  Social History Narrative  Lives alone , One story   Right handed   She retired as a Regulatory affairs officer.    Social Determinants of Health   Financial Resource Strain: Not on file  Food Insecurity: Not on file  Transportation Needs: Not on file  Physical Activity: Not on file  Stress: Not on file  Social Connections: Not on file     Family History: The patient's family history includes Alcoholism in her father; Diabetes in her brother, brother, brother, and mother; Throat cancer in her brother; Uterine cancer in her mother. ROS:   Please see the history of present illness.    All 14 point review of systems negative except as described per history of present illness  EKGs/Labs/Other Studies Reviewed:      Recent Labs: 05/19/2021: Magnesium 1.8 06/21/2021: NT-Pro BNP 5,943 09/02/2021: BUN 37; Creatinine, Ser 1.16; Hemoglobin 9.5; Platelets 240; Potassium 4.5; Sodium 139  Recent Lipid Panel No results found for: "CHOL", "TRIG", "HDL", "CHOLHDL", "VLDL", "LDLCALC", "LDLDIRECT"  Physical Exam:    VS:  BP 140/62 (BP Location: Left Arm)   Pulse 71   Ht 5' (1.524 m)   Wt 128 lb 9.6 oz (58.3 kg)   SpO2 94%   BMI 25.12 kg/m     Wt Readings from Last 3 Encounters:  09/15/21 128 lb 9.6 oz (58.3 kg)  08/31/21 133 lb 3.2 oz (60.4 kg)  08/23/21 130 lb 6.4 oz (59.1 kg)     GEN:  Well nourished,  well developed in no acute distress HEENT: Normal NECK: No JVD; No carotid bruits LYMPHATICS: No lymphadenopathy CARDIAC: Irregular, holosystolic murmur grade 2/6 best heard left border of sternum, no rubs, no gallops RESPIRATORY:  Clear to auscultation without rales, wheezing or rhonchi  ABDOMEN: Soft, non-tender, non-distended MUSCULOSKELETAL:  No edema; No deformity  SKIN: Warm and dry LOWER EXTREMITIES: no swelling NEUROLOGIC:  Alert and oriented x 3 PSYCHIATRIC:  Normal affect   ASSESSMENT:    1. Dilated cardiomyopathy ([place])   2. Permanent atrial fibrillation ([place])   3. Mixed hyperlipidemia   4. Pacemaker   5. Nonrheumatic mitral valve regurgitation    PLAN:    In order of problems listed above:  History of cardiomyopathy but last echocardiogram showed preserved left ventricle ejection fraction.  She is on Diovan 160 daily which I will continue, last echocardiogram done in March showing preserved ejection fraction. Permanent atrial fibrillation, she is refusing anticoagulation, rate is controlled continue present management. Mixed dyslipidemia I did review her K PN which show me data from Aug 12, 2021 with LDL of 81 HDL 76.  We will continue present management. Nonrheumatic mitral valve regurgitation which was moderate at least on last echocardiogram hemodynamically stable.  She is reluctant to have any intervention done.  We will continue monitoring and manage this medically. Essential hypertension blood pressure slightly elevated today.  Will recheck in before she get out of the office today Dysphagia with difficulty swallowing.  She will be referred to GI for evaluation   Medication Adjustments/Labs and Tests Ordered: Current medicines are reviewed at length with the patient today.  Concerns regarding medicines are outlined above.  No orders of the defined types were placed in this encounter.  Medication changes: No orders of the defined types were placed in this  encounter.   Signed, Park Liter, MD, [facility] 09/15/2021 1:19 PM    [place]

## 2021-09-15 NOTE — Patient Instructions (Signed)
Medication Instructions:  Your physician recommends that you continue on your current medications as directed. Please refer to the Current Medication list given to you today.  *If you need a refill on your cardiac medications before your next appointment, please call your pharmacy*   Lab Work: NONE If you have labs (blood work) drawn today and your tests are completely normal, you will receive your results only by: [place] (if you have MyChart) OR A paper copy in the mail If you have any lab test that is abnormal or we need to change your treatment, we will call you to review the results.   Testing/Procedures: NONE   Follow-Up: At [facility], you and your health needs are our priority.  As part of our continuing mission to provide you with exceptional heart care, we have created designated Provider Care Teams.  These Care Teams include your primary Cardiologist (physician) and Advanced Practice Providers (APPs -  Physician Assistants and Nurse Practitioners) who all work together to provide you with the care you need, when you need it.  We recommend signing up for the patient portal called "MyChart".  Sign up information is provided on this After Visit Summary.  MyChart is used to connect with patients for Virtual Visits (Telemedicine).  Patients are able to view lab/test results, encounter notes, upcoming appointments, etc.  Non-urgent messages can be sent to your provider as well.   To learn more about what you can do with MyChart, go to [email or web address].    Your next appointment:   3 month(s)  The format for your next appointment:   In Person  Provider:   Jenne Campus, MD    Other Instructions   Important Information About Sugar

## 2021-09-15 NOTE — Addendum Note (Signed)
Addended by: Jerl Santos R on: 09/15/2021 01:34 PM   Modules accepted: Orders

## 2021-09-21 ENCOUNTER — Ambulatory Visit: Payer: Medicare Other | Admitting: Cardiology

## 2021-09-22 DIAGNOSIS — M1712 Unilateral primary osteoarthritis, left knee: Secondary | ICD-10-CM | POA: Diagnosis not present

## 2021-09-23 ENCOUNTER — Other Ambulatory Visit (HOSPITAL_COMMUNITY): Payer: Medicare Other

## 2021-10-03 ENCOUNTER — Telehealth: Payer: Self-pay | Admitting: Cardiology

## 2021-10-03 ENCOUNTER — Other Ambulatory Visit: Payer: Self-pay

## 2021-10-03 MED ORDER — TORSEMIDE 100 MG PO TABS: 50.0000 mg | ORAL_TABLET | Freq: Two times a day (BID) | ORAL | 6 refills | Status: DC

## 2021-10-03 NOTE — Telephone Encounter (Signed)
Pt c/o medication issue:  1. Name of Medication:   torsemide (DEMADEX) 100 MG tablet    2. How are you currently taking this medication (dosage and times per day)? TAKE '75MG'$  IN THE MORNING AND 50 MG IN THE EVENINGS.Patient taking differently: Take 50 mg by mouth in the morning and at bedtime.  3. Are you having a reaction (difficulty breathing--STAT)? No  4. What is your medication issue? Pt would like a callback to get clarification on medication instructions. Please advise    *STAT* If patient is at the pharmacy, call can be transferred to refill team.   1. Which medications need to be refilled? (please list name of each medication and dose if known)   torsemide (DEMADEX) 100 MG tablet    2. Which pharmacy/location (including street and city if local pharmacy) is medication to be sent to?  [id number] [place] [id number] Phone:  [phone]        3. Do they need a 30 day or 90 day supply?  30 day

## 2021-10-03 NOTE — Telephone Encounter (Signed)
Spoke with pt. She takes '50mg'$  Torsemide in the morning and '50mg'$  in the evening. Sent a new Rx to the pharmacy for '100mg'$  1/2 tablet Bid. #60 ref x6- per pt preference.

## 2021-10-04 ENCOUNTER — Ambulatory Visit (INDEPENDENT_AMBULATORY_CARE_PROVIDER_SITE_OTHER): Payer: Medicare Other

## 2021-10-04 DIAGNOSIS — I420 Dilated cardiomyopathy: Secondary | ICD-10-CM

## 2021-10-06 LAB — CUP PACEART REMOTE DEVICE CHECK
Battery Impedance: 3914 Ohm
Battery Remaining Longevity: 22 mo
Battery Voltage: 2.7 V
Brady Statistic RV Percent Paced: 3 %
Date Time Interrogation Session: 20230719001702
Implantable Lead Implant Date: 20120911
Implantable Lead Implant Date: 20120911
Implantable Lead Location: 753859
Implantable Lead Location: 753860
Implantable Lead Model: 4092
Implantable Lead Model: 5076
Implantable Pulse Generator Implant Date: 20120911
Lead Channel Impedance Value: 604 Ohm
Lead Channel Impedance Value: 67 Ohm
Lead Channel Pacing Threshold Amplitude: 0.5 V
Lead Channel Pacing Threshold Pulse Width: 0.4 ms
Lead Channel Setting Pacing Amplitude: 2 V
Lead Channel Setting Pacing Pulse Width: 0.4 ms
Lead Channel Setting Sensing Sensitivity: 5.6 mV

## 2021-10-11 DIAGNOSIS — M1712 Unilateral primary osteoarthritis, left knee: Secondary | ICD-10-CM | POA: Diagnosis not present

## 2021-10-20 DIAGNOSIS — M810 Age-related osteoporosis without current pathological fracture: Secondary | ICD-10-CM | POA: Diagnosis not present

## 2021-11-01 NOTE — Progress Notes (Signed)
Remote pacemaker transmission.   

## 2021-11-03 DIAGNOSIS — K224 Dyskinesia of esophagus: Secondary | ICD-10-CM | POA: Diagnosis not present

## 2021-11-03 DIAGNOSIS — R1310 Dysphagia, unspecified: Secondary | ICD-10-CM | POA: Diagnosis not present

## 2021-11-03 DIAGNOSIS — I429 Cardiomyopathy, unspecified: Secondary | ICD-10-CM | POA: Diagnosis not present

## 2021-11-08 ENCOUNTER — Telehealth: Payer: Self-pay

## 2021-11-08 NOTE — Telephone Encounter (Signed)
   [facility] Pre-operative Risk Assessment    Request for surgical clearance:  What type of surgery is being performed? EGD   When is this surgery scheduled? 12/08/2021   What type of clearance is required (medical clearance vs. Pharmacy clearance to hold med vs. Both)? Pharmacy  Are there any medications that need to be held prior to surgery and how long?Aspirin to hold on 12/03/2021   Practice name and name of physician performing surgery? Dr. Kyra Leyland  at [facility]  What is your office phone number: [phone]   7.   What is your office fax number: [phone]  8.   Anesthesia type (None, local, MAC, general) ? : Not specified   Basil Dess Khale Nigh 11/08/2021, 8:24 AM  _________________________________________________________________   (provider comments below)

## 2021-11-08 NOTE — Telephone Encounter (Signed)
     Primary Cardiologist: Jenne Campus, MD  Chart reviewed as part of pre-operative protocol coverage. Given past medical history and time since last visit, based on ACC/AHA guidelines, IRA BUSBIN would be at acceptable risk for the planned procedure without further cardiovascular testing.   She is currently not on any blood thinning therapy/agents.  I will route this recommendation to the requesting party via Epic fax function and remove from pre-op pool.  Please call with questions.  Jossie Ng. Ondria Oswald NP-C     11/08/2021, 11:31 AM [address] Office [phone] Fax [phone]

## 2021-11-22 DIAGNOSIS — M25511 Pain in right shoulder: Secondary | ICD-10-CM | POA: Diagnosis not present

## 2021-12-07 ENCOUNTER — Other Ambulatory Visit: Payer: Self-pay | Admitting: Cardiology

## 2021-12-08 ENCOUNTER — Other Ambulatory Visit: Payer: Self-pay

## 2021-12-08 DIAGNOSIS — R1310 Dysphagia, unspecified: Secondary | ICD-10-CM | POA: Diagnosis not present

## 2021-12-08 DIAGNOSIS — K449 Diaphragmatic hernia without obstruction or gangrene: Secondary | ICD-10-CM | POA: Diagnosis not present

## 2021-12-08 DIAGNOSIS — K222 Esophageal obstruction: Secondary | ICD-10-CM | POA: Diagnosis not present

## 2021-12-08 DIAGNOSIS — K2960 Other gastritis without bleeding: Secondary | ICD-10-CM | POA: Diagnosis not present

## 2021-12-08 DIAGNOSIS — K317 Polyp of stomach and duodenum: Secondary | ICD-10-CM | POA: Diagnosis not present

## 2021-12-08 DIAGNOSIS — K224 Dyskinesia of esophagus: Secondary | ICD-10-CM | POA: Diagnosis not present

## 2021-12-08 DIAGNOSIS — K2950 Unspecified chronic gastritis without bleeding: Secondary | ICD-10-CM | POA: Diagnosis not present

## 2021-12-08 DIAGNOSIS — I10 Essential (primary) hypertension: Secondary | ICD-10-CM | POA: Diagnosis not present

## 2021-12-08 DIAGNOSIS — K259 Gastric ulcer, unspecified as acute or chronic, without hemorrhage or perforation: Secondary | ICD-10-CM | POA: Diagnosis not present

## 2021-12-12 DIAGNOSIS — M810 Age-related osteoporosis without current pathological fracture: Secondary | ICD-10-CM | POA: Diagnosis not present

## 2021-12-19 ENCOUNTER — Ambulatory Visit: Payer: Medicare Other | Attending: Cardiology | Admitting: Cardiology

## 2021-12-19 ENCOUNTER — Encounter: Payer: Self-pay | Admitting: Cardiology

## 2021-12-19 VITALS — BP 134/68 | HR 73 | Ht 60.0 in | Wt 129.2 lb

## 2021-12-19 DIAGNOSIS — I340 Nonrheumatic mitral (valve) insufficiency: Secondary | ICD-10-CM | POA: Diagnosis not present

## 2021-12-19 DIAGNOSIS — I509 Heart failure, unspecified: Secondary | ICD-10-CM | POA: Insufficient documentation

## 2021-12-19 DIAGNOSIS — I420 Dilated cardiomyopathy: Secondary | ICD-10-CM | POA: Insufficient documentation

## 2021-12-19 DIAGNOSIS — I10 Essential (primary) hypertension: Secondary | ICD-10-CM | POA: Diagnosis not present

## 2021-12-19 DIAGNOSIS — I482 Chronic atrial fibrillation, unspecified: Secondary | ICD-10-CM | POA: Insufficient documentation

## 2021-12-19 DIAGNOSIS — Z950 Presence of cardiac pacemaker: Secondary | ICD-10-CM | POA: Insufficient documentation

## 2021-12-19 DIAGNOSIS — R0609 Other forms of dyspnea: Secondary | ICD-10-CM | POA: Insufficient documentation

## 2021-12-19 NOTE — Addendum Note (Signed)
Addended by: Jacobo Forest D on: 12/19/2021 03:54 PM   Modules accepted: Orders

## 2021-12-19 NOTE — Patient Instructions (Addendum)
Medication Instructions:  Your physician recommends that you continue on your current medications as directed. Please refer to the Current Medication list given to you today.  *If you need a refill on your cardiac medications before your next appointment, please call your pharmacy*   Lab Work: BMP, ProBNP, Today If you have labs (blood work) drawn today and your tests are completely normal, you will receive your results only by: [place] (if you have MyChart) OR A paper copy in the mail If you have any lab test that is abnormal or we need to change your treatment, we will call you to review the results.   Testing/Procedures: Your physician has requested that you have an echocardiogram. Echocardiography is a painless test that uses sound waves to create images of your heart. It provides your doctor with information about the size and shape of your heart and how well your heart's chambers and valves are working. This procedure takes approximately one hour. There are no restrictions for this procedure.    Follow-U p: At [facility], you and your health needs are our priority.  As part of our continuing mission to provide you with exceptional heart care, we have created designated Provider Care Teams.  These Care Teams include your primary Cardiologist (physician) and Advanced Practice Providers (APPs -  Physician Assistants and Nurse Practitioners) who all work together to provide you with the care you need, when you need it.  We recommend signing up for the patient portal called "MyChart".  Sign up information is provided on this After Visit Summary.  MyChart is used to connect with patients for Virtual Visits (Telemedicine).  Patients are able to view lab/test results, encounter notes, upcoming appointments, etc.  Non-urgent messages can be sent to your provider as well.   To learn more about what you can do with MyChart, go to [email or web address].    Your next appointment:   3  month(s)  The format for your next appointment:   In Person  Provider:   Jenne Campus, MD    Other Instructions NA

## 2021-12-19 NOTE — Progress Notes (Signed)
Cardiology Office Note:    Date:  12/19/2021   ID:  Kaitlyn, [name] [birth year], MRN [id number]  PCP:  Nicoletta Dress, MD  Cardiologist:  Jenne Campus, MD    Referring MD: Nicoletta Dress, MD   Chief Complaint  Patient presents with   Leg Swelling    History of Present Illness:    [name] is a 88 y.o. female with past medical history significant for cardiomyopathy, latest echocardiogram showed normalization of left ventricle ejection fraction, moderate to severe mitral regurgitation, permanent atrial fibrillation refusing anticoagulation, permanent pacemaker present which is Medtronic device, congestive heart failure.  She comes today to my office for follow-up.  She complain of having swelling of lower extremities.  It is worse at evening time.  She takes torsemide on the regular basis she also cut down the amount of fluid that she is drinking.  Denies have any shortness of breath.  She will move around with a walker.  Kind of slow.  Past Medical History:  Diagnosis Date   A-fib ([place])    Anemia    Aortic valve disorder    Arthritis    osteoarthritis - back and knee   Benign hypertensive heart disease without heart failure    Blood transfusion    Body mass index (BMI) 27.0-27.9, adult 01/01/2020   Carpal tunnel syndrome 02/19/2018   Cataract    bilateral   Chest pain at rest 08/18/2014   Chronic atrial fibrillation ([place]) 07/12/2017   Chronic back pain    Chronic thoracic back pain 04/17/2013   Congestive heart failure (CHF) ([place]) 06/03/2020   Coronary artery disease    Degeneration of lumbosacral intervertebral disc 03/31/2014   Dilated cardiomyopathy ([place]) 09/17/2017   Displacement of cervical intervertebral disc without myelopathy 10/22/2014   Displacement of lumbar intervertebral disc without myelopathy 09/24/2012   Diverticulosis    Essential hypertension 10/06/2013   GERD (gastroesophageal reflux disease)    Hematoma 11/04/2013   History of  stomach ulcers    Hyperlipidemia    Hypertension    Impingement syndrome of shoulder region 05/17/2014   Lumbar foraminal stenosis 04/19/2020   Neck pain 10/22/2014   Opioid dependence ([place]) 04/19/2020   Osteoarthrosis    Other long term (current) drug therapy 04/19/2020   Pacemaker    Pacemaker reprogramming/check 08/18/2014   medtronic  Device implantedSeptember 2012 by Angie Fava of this note might be different from the original. Overview:  Overview:  medtronic  Device implantedSeptember 2012 by Agustin Cree   Pain in upper limb 04/27/2015   Paroxysmal atrial fibrillation ([place]) 08/18/2014   Formatting of this note might be different from the original. Not anticoagulated secondary to intracranial bleed while on coumadin   Permanent atrial fibrillation ([place]) 02/11/2019   Pneumonia    Rotator cuff tear arthropathy of right shoulder 11/16/2015   S/P shoulder replacement 11/16/2015   Shoulder impingement 05/17/2014   Skin cancer of arm    L arm   Spinal stenosis 03/31/2014   Status post lumbar surgery    Vitamin D deficiency     Past Surgical History:  Procedure Laterality Date   ABDOMINAL HYSTERECTOMY     APPENDECTOMY     BACK SURGERY     five back surgeries   cataract extaction -left     cataract extraction right     CHOLECYSTECTOMY     COLONOSCOPY     CRANIOTOMY     ? subdural hematoma?  Sustained head injury after falling while  taking Coumadin   ESOPHAGOGASTRODUODENOSCOPY     EYE SURGERY Bilateral    cataract surgery with lens implant   FRACTURE SURGERY     left wrist, with plate and removal of plate   INSERT / REPLACE / REMOVE PACEMAKER     Medtronic   KNEE ARTHROPLASTY     right knee   REVERSE SHOULDER ARTHROPLASTY Right 11/16/2015   REVERSE SHOULDER ARTHROPLASTY Right 11/16/2015   Procedure: REVERSE SHOULDER ARTHROPLASTY;  Surgeon: Marchia Bond, MD;  Location: [place];  Service: Orthopedics;  Laterality: Right;   S1 vertebroplasty     SHOULDER OPEN  ROTATOR CUFF REPAIR  right   Skin cancer removal from L arm  11/02/2020   TOTAL KNEE ARTHROPLASTY Left 08/31/2021   Procedure: TOTAL KNEE ARTHROPLASTY;  Surgeon: Willaim Sheng, MD;  Location: [facility];  Service: Orthopedics;  Laterality: Left;    Current Medications: Current Meds  Medication Sig   acetaminophen (TYLENOL) 325 MG tablet Take 650 mg by mouth every 6 (six) hours as needed (for pain).   CALCIUM PO Take 1 tablet by mouth in the morning.   cholecalciferol (VITAMIN D) 25 MCG (1000 UNIT) tablet Take 1,000 Units by mouth in the morning.   denosumab (PROLIA) 60 MG/ML SOSY injection Inject 60 mg into the skin every 6 (six) months.   diltiazem (CARDIZEM CD) 240 MG 24 hr capsule TAKE 1 CAPSULE BY MOUTH EVERY DAY (Patient taking differently: Take 240 mg by mouth daily.)   famotidine (PEPCID) 40 MG tablet Take 40 mg by mouth in the morning.   ibuprofen (ADVIL) 800 MG tablet Take 800 mg by mouth 3 (three) times daily as needed.   Iodine, Kelp, (KELP PO) Take 1 tablet by mouth in the morning. Sea Kelp (OTC)   Multiple Vitamin (MULTIVITAMIN) tablet Take 1 tablet by mouth daily.   oxyCODONE-acetaminophen (PERCOCET) 10-325 MG tablet Take 1 tablet by mouth every 4 (four) hours as needed for pain.   pantoprazole (PROTONIX) 40 MG tablet Take 40 mg by mouth every evening.   Potassium 99 MG TABS Take 99 mg by mouth in the morning.   torsemide (DEMADEX) 100 MG tablet Take 0.5 tablets (50 mg total) by mouth 2 (two) times daily.   triamcinolone (KENALOG) 0.025 % cream Apply 1 Application topically 2 (two) times daily.   valsartan (DIOVAN) 160 MG tablet Take 160 mg by mouth in the morning.   vitamin B-12 (CYANOCOBALAMIN) 1000 MCG tablet Take 1,000 mcg by mouth in the morning.   VITAMIN E PO Take 1 capsule by mouth in the morning.     Allergies:   Levaquin [levofloxacin], Norvasc [amlodipine], and Penicillins   Social History   Socioeconomic History   Marital status: Widowed    Spouse name:  Not on file   Number of children: 4   Years of education: 43   Highest education level: Not on file  Occupational History   Occupation: retired  Tobacco Use   Smoking status: Never   Smokeless tobacco: Never  Vaping Use   Vaping Use: Never used  Substance and Sexual Activity   Alcohol use: No   Drug use: No   Sexual activity: Not on file  Other Topics Concern   Not on file  Social History Narrative   Lives alone , One story   Right handed   She retired as a Regulatory affairs officer.    Social Determinants of Health   Financial Resource Strain: Not on file  Food Insecurity: Not on file  Transportation Needs: Not on file  Physical Activity: Not on file  Stress: Not on file  Social Connections: Not on file     Family History: The patient's family history includes Alcoholism in her father; Diabetes in her brother, brother, brother, and mother; Throat cancer in her brother; Uterine cancer in her mother. ROS:   Please see the history of present illness.    All 14 point review of systems negative except as described per history of present illness  EKGs/Labs/Other Studies Reviewed:      Recent Labs: 05/19/2021: Magnesium 1.8 06/21/2021: NT-Pro BNP 5,943 09/02/2021: BUN 37; Creatinine, Ser 1.16; Hemoglobin 9.5; Platelets 240; Potassium 4.5; Sodium 139  Recent Lipid Panel No results found for: "CHOL", "TRIG", "HDL", "CHOLHDL", "VLDL", "LDLCALC", "LDLDIRECT"  Physical Exam:    VS:  BP 134/68 (BP Location: Left Arm, Patient Position: Sitting)   Pulse 73   Ht 5' (1.524 m)   Wt 129 lb 3.2 oz (58.6 kg)   SpO2 93%   BMI 25.23 kg/m     Wt Readings from Last 3 Encounters:  12/19/21 129 lb 3.2 oz (58.6 kg)  09/15/21 128 lb 9.6 oz (58.3 kg)  08/31/21 133 lb 3.2 oz (60.4 kg)     GEN:  Well nourished, well developed in no acute distress HEENT: Normal NECK: No JVD; No carotid bruits LYMPHATICS: No lymphadenopathy CARDIAC: Irregular, stock murmur grade 3/6 best heard at apex, no rubs, no  gallops RESPIRATORY:  Clear to auscultation without rales, wheezing or rhonchi  ABDOMEN: Soft, non-tender, non-distended MUSCULOSKELETAL:  No edema; No deformity  SKIN: Warm and dry LOWER EXTREMITIES: 2+ swelling NEUROLOGIC:  Alert and oriented x 3 PSYCHIATRIC:  Normal affect   ASSESSMENT:    1. Nonrheumatic mitral valve regurgitation   2. Chronic atrial fibrillation (HCC)   3. Dilated cardiomyopathy ([place])   4. Essential hypertension   5. Pacemaker    PLAN:    In order of problems listed above:  Nonrheumatic mitral valve regurgitation which is severe.  Again I had a discussion about her regarding potentially fixing it 40.  She expressed some interest in potentially doing mitral valve clip I will ask her to have echocardiogram done.  I also will ask him to have Chem-7 done if Chem-7 is fine I will try small dose of Zaroxolyn.  Maybe twice a week Prior management atrial fibrillation still refusing anticoagulation, she is on Cardizem CD 240 which I will continue.  Heart rate still be elevated but she still does not want to do anything about it History dilated cardiomyopathy likely you improved overall. Dyslipidemia: She is not taking any cholesterol medication, her K PN show me LDL 81 HDL 76 this data is from May of this year, will continue present management.   Medication Adjustments/Labs and Tests Ordered: Current medicines are reviewed at length with the patient today.  Concerns regarding medicines are outlined above.  No orders of the defined types were placed in this encounter.  Medication changes: No orders of the defined types were placed in this encounter.   Signed, Park Liter, MD, [facility] 12/19/2021 3:35 PM    [place]

## 2021-12-20 LAB — BASIC METABOLIC PANEL
BUN/Creatinine Ratio: 32 — ABNORMAL HIGH (ref 12–28)
BUN: 36 mg/dL — ABNORMAL HIGH (ref 8–27)
CO2: 20 mmol/L (ref 20–29)
Calcium: 7.9 mg/dL — ABNORMAL LOW (ref 8.7–10.3)
Chloride: 106 mmol/L (ref 96–106)
Creatinine, Ser: 1.14 mg/dL — ABNORMAL HIGH (ref 0.57–1.00)
Glucose: 102 mg/dL — ABNORMAL HIGH (ref 70–99)
Potassium: 4.6 mmol/L (ref 3.5–5.2)
Sodium: 145 mmol/L — ABNORMAL HIGH (ref 134–144)
eGFR: 46 mL/min/{1.73_m2} — ABNORMAL LOW (ref >59)

## 2021-12-20 LAB — PRO B NATRIURETIC PEPTIDE: NT-Pro BNP: 5433 pg/mL — ABNORMAL HIGH (ref 0–738)

## 2021-12-21 ENCOUNTER — Telehealth: Payer: Self-pay | Admitting: Cardiology

## 2021-12-21 ENCOUNTER — Telehealth: Payer: Self-pay

## 2021-12-21 DIAGNOSIS — I10 Essential (primary) hypertension: Secondary | ICD-10-CM

## 2021-12-21 MED ORDER — METOLAZONE 2.5 MG PO TABS: 2.5000 mg | ORAL_TABLET | ORAL | 3 refills | Status: DC

## 2021-12-21 NOTE — Telephone Encounter (Signed)
Results reviewed with pt as per Dr. Wendy Poet note. Zaroxolyn sent to [facility]. Pt will come back for labs in 2 weeks. Pt verbalized understanding and had no additional questions. Routed to PCP

## 2021-12-23 NOTE — Telephone Encounter (Signed)
Pt was calling for results. Requesting call back.

## 2021-12-29 ENCOUNTER — Ambulatory Visit: Payer: Medicare Other | Attending: Cardiology

## 2021-12-29 DIAGNOSIS — I340 Nonrheumatic mitral (valve) insufficiency: Secondary | ICD-10-CM

## 2021-12-29 DIAGNOSIS — R0609 Other forms of dyspnea: Secondary | ICD-10-CM | POA: Insufficient documentation

## 2021-12-29 LAB — ECHOCARDIOGRAM COMPLETE
Area-P 1/2: 4.6 cm2
MV M vel: 5.15 m/s
MV Peak grad: 106.1 mmHg
P 1/2 time: 365 msec
S' Lateral: 3.6 cm

## 2022-01-03 ENCOUNTER — Ambulatory Visit (INDEPENDENT_AMBULATORY_CARE_PROVIDER_SITE_OTHER): Payer: Medicare Other

## 2022-01-03 DIAGNOSIS — I420 Dilated cardiomyopathy: Secondary | ICD-10-CM

## 2022-01-03 LAB — CUP PACEART REMOTE DEVICE CHECK
Battery Impedance: 4129 Ohm
Battery Remaining Longevity: 20 mo
Battery Voltage: 2.71 V
Brady Statistic RV Percent Paced: 3 %
Date Time Interrogation Session: 20231017140824
Implantable Lead Implant Date: 20120911
Implantable Lead Implant Date: 20120911
Implantable Lead Location: 753859
Implantable Lead Location: 753860
Implantable Lead Model: 4092
Implantable Lead Model: 5076
Implantable Pulse Generator Implant Date: 20120911
Lead Channel Impedance Value: 632 Ohm
Lead Channel Impedance Value: 67 Ohm
Lead Channel Pacing Threshold Amplitude: 0.5 V
Lead Channel Pacing Threshold Pulse Width: 0.4 ms
Lead Channel Setting Pacing Amplitude: 2 V
Lead Channel Setting Pacing Pulse Width: 0.4 ms
Lead Channel Setting Sensing Sensitivity: 5.6 mV

## 2022-01-10 ENCOUNTER — Telehealth: Payer: Self-pay

## 2022-01-10 NOTE — Telephone Encounter (Signed)
Patient notified of results.

## 2022-01-10 NOTE — Telephone Encounter (Signed)
-----   Message from Park Liter, MD sent at 01/05/2022  8:00 PM EDT ----- Echocardiogram showed low normal ejection fraction, mild to moderate mitral valve regurgitation, enlarged left atrium all of this is from medical therapy

## 2022-01-18 NOTE — Progress Notes (Signed)
Remote pacemaker transmission.   

## 2022-01-26 DIAGNOSIS — R051 Acute cough: Secondary | ICD-10-CM | POA: Diagnosis not present

## 2022-01-26 DIAGNOSIS — J22 Unspecified acute lower respiratory infection: Secondary | ICD-10-CM | POA: Diagnosis not present

## 2022-01-26 DIAGNOSIS — R0981 Nasal congestion: Secondary | ICD-10-CM | POA: Diagnosis not present

## 2022-02-10 DIAGNOSIS — S41112A Laceration without foreign body of left upper arm, initial encounter: Secondary | ICD-10-CM | POA: Diagnosis not present

## 2022-03-28 ENCOUNTER — Ambulatory Visit: Payer: Medicare Other | Admitting: Cardiology

## 2022-03-31 ENCOUNTER — Encounter: Payer: Self-pay | Admitting: Cardiology

## 2022-03-31 ENCOUNTER — Ambulatory Visit: Payer: Medicare Other | Attending: Cardiology | Admitting: Cardiology

## 2022-03-31 VITALS — BP 140/70 | HR 82 | Ht 60.0 in | Wt 129.0 lb

## 2022-03-31 DIAGNOSIS — I420 Dilated cardiomyopathy: Secondary | ICD-10-CM | POA: Diagnosis not present

## 2022-03-31 DIAGNOSIS — I5043 Acute on chronic combined systolic (congestive) and diastolic (congestive) heart failure: Secondary | ICD-10-CM | POA: Insufficient documentation

## 2022-03-31 DIAGNOSIS — I340 Nonrheumatic mitral (valve) insufficiency: Secondary | ICD-10-CM | POA: Insufficient documentation

## 2022-03-31 NOTE — Progress Notes (Signed)
Cardiology Office Note:    Date:  03/31/2022   ID:  [name], DOB February 20, 1932, MRN [id number]  PCP:  Nicoletta Dress, MD  Cardiologist:  Jenne Campus, MD    Referring MD: Nicoletta Dress, MD   Chief Complaint  Patient presents with   Follow-up    History of Present Illness:    [name] is a 89 y.o. female past medical history significant for cardiomyopathy, last echocardiogram showed low normal ejection fraction, also mitral regurgitation previously severe but last echocardiogram showed mild to moderate mitral regurgitation with mildly elevated pulmonary pressure, she also have permanent atrial fibrillation, refusing anticoagulation, permanent pacemaker which is a Medtronic device, she is in my office today for follow-up and after May she looks good.  Swelling of lower extremities only minimal.  She denies have any shortness of breath chest pain tightness squeezing pressure burning chest.  Walking around is a problem because of some back issues.  Past Medical History:  Diagnosis Date   A-fib ([place])    Anemia    Aortic valve disorder    Arthritis    osteoarthritis - back and knee   Benign hypertensive heart disease without heart failure    Blood transfusion    Body mass index (BMI) 27.0-27.9, adult 01/01/2020   Carpal tunnel syndrome 02/19/2018   Cataract    bilateral   Chest pain at rest 08/18/2014   Chronic atrial fibrillation ([place]) 07/12/2017   Chronic back pain    Chronic thoracic back pain 04/17/2013   Congestive heart failure (CHF) ([place]) 06/03/2020   Coronary artery disease    Degeneration of lumbosacral intervertebral disc 03/31/2014   Dilated cardiomyopathy ([place]) 09/17/2017   Displacement of cervical intervertebral disc without myelopathy 10/22/2014   Displacement of lumbar intervertebral disc without myelopathy 09/24/2012   Diverticulosis    Essential hypertension 10/06/2013   GERD (gastroesophageal reflux disease)    Hematoma 11/04/2013    History of stomach ulcers    Hyperlipidemia    Hypertension    Impingement syndrome of shoulder region 05/17/2014   Lumbar foraminal stenosis 04/19/2020   Neck pain 10/22/2014   Opioid dependence ([place]) 04/19/2020   Osteoarthrosis    Other long term (current) drug therapy 04/19/2020   Pacemaker    Pacemaker reprogramming/check 08/18/2014   medtronic  Device implantedSeptember 2012 by Angie Fava of this note might be different from the original. Overview:  Overview:  medtronic  Device implantedSeptember 2012 by Agustin Cree   Pain in upper limb 04/27/2015   Paroxysmal atrial fibrillation ([place]) 08/18/2014   Formatting of this note might be different from the original. Not anticoagulated secondary to intracranial bleed while on coumadin   Permanent atrial fibrillation ([place]) 02/11/2019   Pneumonia    Rotator cuff tear arthropathy of right shoulder 11/16/2015   S/P shoulder replacement 11/16/2015   Shoulder impingement 05/17/2014   Skin cancer of arm    L arm   Spinal stenosis 03/31/2014   Status post lumbar surgery    Vitamin D deficiency     Past Surgical History:  Procedure Laterality Date   ABDOMINAL HYSTERECTOMY     APPENDECTOMY     BACK SURGERY     five back surgeries   cataract extaction -left     cataract extraction right     CHOLECYSTECTOMY     COLONOSCOPY     CRANIOTOMY     ? subdural hematoma?  Sustained head injury after falling while taking Coumadin   ESOPHAGOGASTRODUODENOSCOPY  EYE SURGERY Bilateral    cataract surgery with lens implant   FRACTURE SURGERY     left wrist, with plate and removal of plate   INSERT / REPLACE / REMOVE PACEMAKER     Medtronic   KNEE ARTHROPLASTY     right knee   REVERSE SHOULDER ARTHROPLASTY Right 11/16/2015   REVERSE SHOULDER ARTHROPLASTY Right 11/16/2015   Procedure: REVERSE SHOULDER ARTHROPLASTY;  Surgeon: Marchia Bond, MD;  Location: [facility];  Service: Orthopedics;  Laterality: Right;   S1 vertebroplasty      SHOULDER OPEN ROTATOR CUFF REPAIR  right   Skin cancer removal from L arm  11/02/2020   TOTAL KNEE ARTHROPLASTY Left 08/31/2021   Procedure: TOTAL KNEE ARTHROPLASTY;  Surgeon: Willaim Sheng, MD;  [facility];  Service: Orthopedics;  Laterality: Left;    Current Medications: Current Meds  Medication Sig   acetaminophen (TYLENOL) 325 MG tablet Take 650 mg by mouth every 6 (six) hours as needed (for pain).   CALCIUM PO Take 1 tablet by mouth in the morning.   cholecalciferol (VITAMIN D) 25 MCG (1000 UNIT) tablet Take 1,000 Units by mouth in the morning.   denosumab (PROLIA) 60 MG/ML SOSY injection Inject 60 mg into the skin every 6 (six) months.   diltiazem (CARDIZEM CD) 240 MG 24 hr capsule TAKE 1 CAPSULE BY MOUTH EVERY DAY   famotidine (PEPCID) 40 MG tablet Take 40 mg by mouth in the morning.   hydrOXYzine (ATARAX) 10 MG tablet Take 10 mg by mouth 3 (three) times daily as needed for anxiety.   ibuprofen (ADVIL) 800 MG tablet Take 800 mg by mouth 3 (three) times daily as needed.   Iodine, Kelp, (KELP PO) Take 1 tablet by mouth in the morning. Sea Kelp (OTC)   metolazone (ZAROXOLYN) 2.5 MG tablet Take 1 tablet (2.5 mg total) by mouth 2 (two) times a week.   Multiple Vitamin (MULTIVITAMIN) tablet Take 1 tablet by mouth daily.   oxyCODONE-acetaminophen (PERCOCET) 10-325 MG tablet Take 1 tablet by mouth every 4 (four) hours as needed for pain.   pantoprazole (PROTONIX) 40 MG tablet Take 40 mg by mouth every evening.   Potassium 99 MG TABS Take 99 mg by mouth in the morning.   torsemide (DEMADEX) 100 MG tablet Take 0.5 tablets (50 mg total) by mouth 2 (two) times daily.   triamcinolone (KENALOG) 0.025 % cream Apply 1 Application topically 2 (two) times daily.   valsartan (DIOVAN) 160 MG tablet Take 160 mg by mouth in the morning.   vitamin B-12 (CYANOCOBALAMIN) 1000 MCG tablet Take 1,000 mcg by mouth in the morning.   VITAMIN E PO Take 1 capsule by mouth in the morning.      Allergies:   Levaquin [levofloxacin], Norvasc [amlodipine], and Penicillins   Social History   Socioeconomic History   Marital status: Widowed    Spouse name: Not on file   Number of children: 4   Years of education: 22   Highest education level: Not on file  Occupational History   Occupation: retired  Tobacco Use   Smoking status: Never   Smokeless tobacco: Never  Vaping Use   Vaping Use: Never used  Substance and Sexual Activity   Alcohol use: No   Drug use: No   Sexual activity: Not on file  Other Topics Concern   Not on file  Social History Narrative   Lives alone , One story   Right handed   She retired as a Regulatory affairs officer.  Social Determinants of Health   Financial Resource Strain: Not on file  Food Insecurity: Not on file  Transportation Needs: Not on file  Physical Activity: Not on file  Stress: Not on file  Social Connections: Not on file     Family History: The patient's family history includes Alcoholism in her father; Diabetes in her brother, brother, brother, and mother; Throat cancer in her brother; Uterine cancer in her mother. ROS:   Please see the history of present illness.    All 14 point review of systems negative except as described per history of present illness  EKGs/Labs/Other Studies Reviewed:      Recent Labs: 05/19/2021: Magnesium 1.8 09/02/2021: Hemoglobin 9.5; Platelets 240 12/19/2021: BUN 36; Creatinine, Ser 1.14; NT-Pro BNP 5,433; Potassium 4.6; Sodium 145  Recent Lipid Panel No results found for: "CHOL", "TRIG", "HDL", "CHOLHDL", "VLDL", "LDLCALC", "LDLDIRECT"  Physical Exam:    VS:  BP (!) 140/70 (BP Location: Left Arm, Patient Position: Sitting, Cuff Size: Normal)   Pulse 82   Ht 5' (1.524 m)   Wt 129 lb (58.5 kg)   SpO2 97%   BMI 25.19 kg/m     Wt Readings from Last 3 Encounters:  03/31/22 129 lb (58.5 kg)  12/19/21 129 lb 3.2 oz (58.6 kg)  09/15/21 128 lb 9.6 oz (58.3 kg)     GEN:  Well nourished, well developed  in no acute distress HEENT: Normal NECK: No JVD; No carotid bruits LYMPHATICS: No lymphadenopathy CARDIAC: RRR, holosystolic murmur grade 2/6 best heard left border sternum, no rubs, no gallops RESPIRATORY:  Clear to auscultation without rales, wheezing or rhonchi  ABDOMEN: Soft, non-tender, non-distended MUSCULOSKELETAL:  No edema; No deformity  SKIN: Warm and dry LOWER EXTREMITIES: no swelling NEUROLOGIC:  Alert and oriented x 3 PSYCHIATRIC:  Normal affect   ASSESSMENT:    1. Dilated cardiomyopathy ([place])   2. Acute on chronic combined systolic and diastolic congestive heart failure ([place])   3. Nonrheumatic mitral valve regurgitation    PLAN:    In order of problems listed above:  History of cardiomyopathy seems to be compensated on physical exam I continue present medications.  She did have difficulty tolerating additional medication therefore I think this is the maximum medical therapy. Congestive heart failure.  Seems to be compensated again. Nonrheumatic mitral valve regurgitation now mild to moderate. Pacemaker presence of Medtronic device at the review interrogation she got 20 months left in the device normal function continue monitoring.   Medication Adjustments/Labs and Tests Ordered: Current medicines are reviewed at length with the patient today.  Concerns regarding medicines are outlined above.  No orders of the defined types were placed in this encounter.  Medication changes: No orders of the defined types were placed in this encounter.   Signed, Park Liter, MD, [facility] 03/31/2022 11:25 AM    [place]

## 2022-03-31 NOTE — Addendum Note (Signed)
Addended by: Edwyna Shell I on: 03/31/2022 11:46 AM   Modules accepted: Orders

## 2022-03-31 NOTE — Patient Instructions (Signed)
Medication Instructions:  Your physician recommends that you continue on your current medications as directed. Please refer to the Current Medication list given to you today.  *If you need a refill on your cardiac medications before your next appointment, please call your pharmacy*   Lab Work: Your physician recommends that you return for lab work in:   Labs today: BMP  If you have labs (blood work) drawn today and your tests are completely normal, you will receive your results only by: [place] (if you have [place]) OR A paper copy in the mail If you have any lab test that is abnormal or we need to change your treatment, we will call you to review the results.   Testing/Procedures: Your physician has requested that you have an echocardiogram. Echocardiography is a painless test that uses sound waves to create images of your heart. It provides your doctor with information about the size and shape of your heart and how well your heart's chambers and valves are working. This procedure takes approximately one hour. There are no restrictions for this procedure. Please do NOT wear cologne, perfume, aftershave, or lotions (deodorant is allowed). Please arrive 15 minutes prior to your appointment time.    Follow-Up: At [facility], you and your health needs are our priority.  As part of our continuing mission to provide you with exceptional heart care, we have created designated Provider Care Teams.  These Care Teams include your primary Cardiologist (physician) and Advanced Practice Providers (APPs -  Physician Assistants and Nurse Practitioners) who all work together to provide you with the care you need, when you need it.  We recommend signing up for the patient portal called "MyChart".  Sign up information is provided on this After Visit Summary.  MyChart is used to connect with patients for Virtual Visits (Telemedicine).  Patients are able to view lab/test results, encounter  notes, upcoming appointments, etc.  Non-urgent messages can be sent to your provider as well.   To learn more about what you can do with MyChart, go to [email or web address].    Your next appointment:   3 month(s)  Provider:   Jenne Campus, MD    Other Instructions None

## 2022-04-01 LAB — BASIC METABOLIC PANEL WITH GFR
BUN/Creatinine Ratio: 44 — ABNORMAL HIGH (ref 12–28)
BUN: 53 mg/dL — ABNORMAL HIGH (ref 8–27)
CO2: 22 mmol/L (ref 20–29)
Calcium: 9 mg/dL (ref 8.7–10.3)
Chloride: 106 mmol/L (ref 96–106)
Creatinine, Ser: 1.21 mg/dL — ABNORMAL HIGH (ref 0.57–1.00)
Glucose: 100 mg/dL — ABNORMAL HIGH (ref 70–99)
Potassium: 5.1 mmol/L (ref 3.5–5.2)
Sodium: 143 mmol/L (ref 134–144)
eGFR: 43 mL/min/1.73 — ABNORMAL LOW (ref >59)

## 2022-04-04 ENCOUNTER — Ambulatory Visit: Payer: Medicare Other | Attending: Cardiology

## 2022-04-04 DIAGNOSIS — I420 Dilated cardiomyopathy: Secondary | ICD-10-CM

## 2022-04-04 LAB — CUP PACEART REMOTE DEVICE CHECK
Battery Impedance: 4341 Ohm
Battery Remaining Longevity: 19 mo
Battery Voltage: 2.7 V
Brady Statistic RV Percent Paced: 3 %
Date Time Interrogation Session: 20240116100336
Implantable Lead Connection Status: 753985
Implantable Lead Connection Status: 753985
Implantable Lead Implant Date: 20120911
Implantable Lead Implant Date: 20120911
Implantable Lead Location: 753859
Implantable Lead Location: 753860
Implantable Lead Model: 4092
Implantable Lead Model: 5076
Implantable Pulse Generator Implant Date: 20120911
Lead Channel Impedance Value: 619 Ohm
Lead Channel Impedance Value: 67 Ohm
Lead Channel Pacing Threshold Amplitude: 0.5 V
Lead Channel Pacing Threshold Pulse Width: 0.4 ms
Lead Channel Setting Pacing Amplitude: 2 V
Lead Channel Setting Pacing Pulse Width: 0.4 ms
Lead Channel Setting Sensing Sensitivity: 5.6 mV
Zone Setting Status: 755011
Zone Setting Status: 755011

## 2022-04-05 ENCOUNTER — Telehealth: Payer: Self-pay

## 2022-04-05 NOTE — Telephone Encounter (Signed)
Results reviewed with pt as per Dr. Krasowski's note.  Pt verbalized understanding and had no additional questions. Routed to PCP  

## 2022-04-07 DIAGNOSIS — Z23 Encounter for immunization: Secondary | ICD-10-CM | POA: Diagnosis not present

## 2022-04-19 DIAGNOSIS — I480 Paroxysmal atrial fibrillation: Secondary | ICD-10-CM | POA: Diagnosis not present

## 2022-04-19 DIAGNOSIS — I10 Essential (primary) hypertension: Secondary | ICD-10-CM | POA: Diagnosis not present

## 2022-04-19 DIAGNOSIS — K219 Gastro-esophageal reflux disease without esophagitis: Secondary | ICD-10-CM | POA: Diagnosis not present

## 2022-05-01 NOTE — Progress Notes (Signed)
Remote pacemaker transmission.   

## 2022-05-26 DIAGNOSIS — M25421 Effusion, right elbow: Secondary | ICD-10-CM | POA: Diagnosis not present

## 2022-05-30 DIAGNOSIS — M810 Age-related osteoporosis without current pathological fracture: Secondary | ICD-10-CM | POA: Diagnosis not present

## 2022-06-01 DIAGNOSIS — M7021 Olecranon bursitis, right elbow: Secondary | ICD-10-CM | POA: Diagnosis not present

## 2022-06-14 DIAGNOSIS — M810 Age-related osteoporosis without current pathological fracture: Secondary | ICD-10-CM | POA: Diagnosis not present

## 2022-06-29 ENCOUNTER — Ambulatory Visit: Payer: Medicare Other | Attending: Cardiology

## 2022-06-29 DIAGNOSIS — I420 Dilated cardiomyopathy: Secondary | ICD-10-CM | POA: Diagnosis not present

## 2022-06-29 DIAGNOSIS — I5043 Acute on chronic combined systolic (congestive) and diastolic (congestive) heart failure: Secondary | ICD-10-CM

## 2022-06-29 DIAGNOSIS — I340 Nonrheumatic mitral (valve) insufficiency: Secondary | ICD-10-CM

## 2022-06-29 LAB — ECHOCARDIOGRAM COMPLETE
Area-P 1/2: 5.38 cm2
Calc EF: 42.9 %
MV M vel: 5.5 m/s
MV Peak grad: 121 mmHg
P 1/2 time: 381 msec
Radius: 0.8 cm
S' Lateral: 4 cm
Single Plane A2C EF: 42.5 %
Single Plane A4C EF: 44.2 %

## 2022-07-04 ENCOUNTER — Ambulatory Visit: Payer: Medicare Other

## 2022-07-04 DIAGNOSIS — M5440 Lumbago with sciatica, unspecified side: Secondary | ICD-10-CM | POA: Diagnosis not present

## 2022-07-06 ENCOUNTER — Encounter: Payer: Self-pay | Admitting: Cardiology

## 2022-07-06 ENCOUNTER — Ambulatory Visit: Payer: Medicare Other | Admitting: Cardiology

## 2022-07-06 ENCOUNTER — Telehealth: Payer: Self-pay

## 2022-07-06 ENCOUNTER — Ambulatory Visit: Payer: Medicare Other | Attending: Cardiology | Admitting: Cardiology

## 2022-07-06 VITALS — BP 144/80 | HR 54 | Ht 60.0 in | Wt 126.8 lb

## 2022-07-06 DIAGNOSIS — I340 Nonrheumatic mitral (valve) insufficiency: Secondary | ICD-10-CM | POA: Diagnosis not present

## 2022-07-06 DIAGNOSIS — I480 Paroxysmal atrial fibrillation: Secondary | ICD-10-CM | POA: Diagnosis not present

## 2022-07-06 DIAGNOSIS — I10 Essential (primary) hypertension: Secondary | ICD-10-CM | POA: Diagnosis not present

## 2022-07-06 DIAGNOSIS — I5043 Acute on chronic combined systolic (congestive) and diastolic (congestive) heart failure: Secondary | ICD-10-CM

## 2022-07-06 NOTE — Telephone Encounter (Signed)
-----   Message from Georgeanna Lea, MD sent at 06/29/2022  4:46 PM EDT ----- Echocardiogram showed normal left ventricle ejection fraction, moderate to severe mitral valve regurgitation which we knew about, moderately dilated left atrium.  Will talk details during next visit

## 2022-07-06 NOTE — Patient Instructions (Signed)

## 2022-07-06 NOTE — Telephone Encounter (Signed)
Results dicussed with the patient on today's apt.

## 2022-07-11 NOTE — Progress Notes (Signed)
Cardiology Office Note:    Date:  07/11/2022   ID:  [name], [name] 1931-03-25, MRN [id number]  PCP:  Paulina Fusi, MD  Cardiologist:  Gypsy Balsam, MD    Referring MD: Paulina Fusi, MD   Chief Complaint  Patient presents with   Follow-up  Doing fine  History of Present Illness:    [name] is a 89 y.o. female past medical history significant for cardiomyopathy last echocardiogram showed normal left ventricle ejection fraction, mitral regurgitation previously severe but now mild to moderate, elevated pulmonary pressure only mildly, permanent atrial fibrillation she is refusing anticoagulation, permanent pacemaker which is Medtronic device. She is coming today to my office for follow-up pleural doing well.  Denies have any chest pain tightness squeezing pressure burning chest.  She does have chronic back pain and she is struggling with that a lot.  Denies have any cardiac complaints  Past Medical History:  Diagnosis Date   A-fib    Anemia    Aortic valve disorder    Arthritis    osteoarthritis - back and knee   Benign hypertensive heart disease without heart failure    Blood transfusion    Body mass index (BMI) 27.0-27.9, adult 01/01/2020   Carpal tunnel syndrome 02/19/2018   Cataract    bilateral   Chest pain at rest 08/18/2014   Chronic atrial fibrillation 07/12/2017   Chronic back pain    Chronic thoracic back pain 04/17/2013   Congestive heart failure (CHF) 06/03/2020   Coronary artery disease    Degeneration of lumbosacral intervertebral disc 03/31/2014   Dilated cardiomyopathy 09/17/2017   Displacement of cervical intervertebral disc without myelopathy 10/22/2014   Displacement of lumbar intervertebral disc without myelopathy 09/24/2012   Diverticulosis    Essential hypertension 10/06/2013   GERD (gastroesophageal reflux disease)    Hematoma 11/04/2013   History of stomach ulcers    Hyperlipidemia    Hypertension    Impingement  syndrome of shoulder region 05/17/2014   Lumbar foraminal stenosis 04/19/2020   Neck pain 10/22/2014   Opioid dependence 04/19/2020   Osteoarthrosis    Other long term (current) drug therapy 04/19/2020   Pacemaker    Pacemaker reprogramming/check 08/18/2014   medtronic  Device implantedSeptember 2012 by Aretha Parrot of this note might be different from the original. Overview:  Overview:  medtronic  Device implantedSeptember 2012 by Bing Matter   Pain in upper limb 04/27/2015   Paroxysmal atrial fibrillation 08/18/2014   Formatting of this note might be different from the original. Not anticoagulated secondary to intracranial bleed while on coumadin   Permanent atrial fibrillation 02/11/2019   Pneumonia    Rotator cuff tear arthropathy of right shoulder 11/16/2015   S/P shoulder replacement 11/16/2015   Shoulder impingement 05/17/2014   Skin cancer of arm    L arm   Spinal stenosis 03/31/2014   Status post lumbar surgery    Vitamin D deficiency     Past Surgical History:  Procedure Laterality Date   ABDOMINAL HYSTERECTOMY     APPENDECTOMY     BACK SURGERY     five back surgeries   cataract extaction -left     cataract extraction right     CHOLECYSTECTOMY     COLONOSCOPY     CRANIOTOMY     ? subdural hematoma?  Sustained head injury after falling while taking Coumadin   ESOPHAGOGASTRODUODENOSCOPY     EYE SURGERY Bilateral    cataract surgery with lens implant  FRACTURE SURGERY     left wrist, with plate and removal of plate   INSERT / REPLACE / REMOVE PACEMAKER     Medtronic   KNEE ARTHROPLASTY     right knee   REVERSE SHOULDER ARTHROPLASTY Right 11/16/2015   REVERSE SHOULDER ARTHROPLASTY Right 11/16/2015   Procedure: REVERSE SHOULDER ARTHROPLASTY;  Surgeon: Teryl Lucy, MD;  Location: [facility] OR;  Service: Orthopedics;  Laterality: Right;   S1 vertebroplasty     SHOULDER OPEN ROTATOR CUFF REPAIR  right   Skin cancer removal from L arm  11/02/2020   TOTAL KNEE  ARTHROPLASTY Left 08/31/2021   Procedure: TOTAL KNEE ARTHROPLASTY;  Surgeon: Joen Laura, MD;  Location: [facility];  Service: Orthopedics;  Laterality: Left;    Current Medications: Current Meds  Medication Sig   acetaminophen (TYLENOL) 325 MG tablet Take 650 mg by mouth every 6 (six) hours as needed (for pain).   CALCIUM PO Take 1 tablet by mouth in the morning.   cholecalciferol (VITAMIN D) 25 MCG (1000 UNIT) tablet Take 1,000 Units by mouth in the morning.   denosumab (PROLIA) 60 MG/ML SOSY injection Inject 60 mg into the skin every 6 (six) months.   diltiazem (CARDIZEM CD) 240 MG 24 hr capsule TAKE 1 CAPSULE BY MOUTH EVERY DAY (Patient taking differently: Take 240 mg by mouth daily.)   famotidine (PEPCID) 40 MG tablet Take 40 mg by mouth in the morning.   hydrOXYzine (ATARAX) 10 MG tablet Take 10 mg by mouth 3 (three) times daily as needed for anxiety.   ibuprofen (ADVIL) 800 MG tablet Take 800 mg by mouth 3 (three) times daily as needed for mild pain or moderate pain.   Iodine, Kelp, (KELP PO) Take 1 tablet by mouth in the morning. Sea Kelp (OTC)   metolazone (ZAROXOLYN) 2.5 MG tablet Take 1 tablet (2.5 mg total) by mouth 2 (two) times a week.   Multiple Vitamin (MULTIVITAMIN) tablet Take 1 tablet by mouth daily.   oxyCODONE-acetaminophen (PERCOCET) 10-325 MG tablet Take 1 tablet by mouth every 4 (four) hours as needed for pain.   pantoprazole (PROTONIX) 40 MG tablet Take 40 mg by mouth every evening.   Potassium 99 MG TABS Take 99 mg by mouth in the morning.   torsemide (DEMADEX) 100 MG tablet Take 0.5 tablets (50 mg total) by mouth 2 (two) times daily.   triamcinolone (KENALOG) 0.025 % cream Apply 1 Application topically 2 (two) times daily.   valsartan (DIOVAN) 160 MG tablet Take 160 mg by mouth in the morning.   vitamin B-12 (CYANOCOBALAMIN) 1000 MCG tablet Take 1,000 mcg by mouth in the morning.   VITAMIN E PO Take 1 capsule by mouth in the morning.     Allergies:    Levaquin [levofloxacin], Norvasc [amlodipine], and Penicillins   Social History   Socioeconomic History   Marital status: Widowed    Spouse name: Not on file   Number of children: 4   Years of education: 39   Highest education level: Not on file  Occupational History   Occupation: retired  Tobacco Use   Smoking status: Never   Smokeless tobacco: Never  Vaping Use   Vaping Use: Never used  Substance and Sexual Activity   Alcohol use: No   Drug use: No   Sexual activity: Not on file  Other Topics Concern   Not on file  Social History Narrative   Lives alone , One story   Right handed   She retired as  a Neurosurgeon.    Social Determinants of Health   Financial Resource Strain: Not on file  Food Insecurity: Not on file  Transportation Needs: Not on file  Physical Activity: Not on file  Stress: Not on file  Social Connections: Not on file     Family History: The patient's family history includes Alcoholism in her father; Diabetes in her brother, brother, brother, and mother; Throat cancer in her brother; Uterine cancer in her mother. ROS:   Please see the history of present illness.    All 14 point review of systems negative except as described per history of present illness  EKGs/Labs/Other Studies Reviewed:      Recent Labs: 09/02/2021: Hemoglobin 9.5; Platelets 240 12/19/2021: NT-Pro BNP 5,433 03/31/2022: BUN 53; Creatinine, Ser 1.21; Potassium 5.1; Sodium 143  Recent Lipid Panel No results found for: "CHOL", "TRIG", "HDL", "CHOLHDL", "VLDL", "LDLCALC", "LDLDIRECT"  Physical Exam:    VS:  BP (!) 144/80 (BP Location: Left Arm, Patient Position: Sitting)   Pulse (!) 54   Ht 5' (1.524 m)   Wt 126 lb 12.8 oz (57.5 kg)   SpO2 97%   BMI 24.76 kg/m     Wt Readings from Last 3 Encounters:  07/06/22 126 lb 12.8 oz (57.5 kg)  03/31/22 129 lb (58.5 kg)  12/19/21 129 lb 3.2 oz (58.6 kg)     GEN:  Well nourished, well developed in no acute distress HEENT:  Normal NECK: No JVD; No carotid bruits LYMPHATICS: No lymphadenopathy CARDIAC: RRR, no murmurs, no rubs, no gallops RESPIRATORY:  Clear to auscultation without rales, wheezing or rhonchi  ABDOMEN: Soft, non-tender, non-distended MUSCULOSKELETAL:  No edema; No deformity  SKIN: Warm and dry LOWER EXTREMITIES: no swelling NEUROLOGIC:  Alert and oriented x 3 PSYCHIATRIC:  Normal affect   ASSESSMENT:    1. Acute on chronic combined systolic and diastolic congestive heart failure   2. Essential (primary) hypertension   3. Nonrheumatic mitral valve regurgitation   4. Paroxysmal atrial fibrillation    PLAN:    In order of problems listed above:  Congestive heart failure seems to be compensated we will continue present management. Essential hypertension blood pressure well-controlled continue present management. Nonrheumatic mitral valve regurgitation initially severe but now much better improved.  Will continue monitoring. Permanent atrial fibrillation, refused anticoagulation   Medication Adjustments/Labs and Tests Ordered: Current medicines are reviewed at length with the patient today.  Concerns regarding medicines are outlined above.  No orders of the defined types were placed in this encounter.  Medication changes: No orders of the defined types were placed in this encounter.   Signed, Georgeanna Lea, MD, [facility] 07/11/2022 2:50 PM    [facility]

## 2022-07-14 ENCOUNTER — Telehealth: Payer: Self-pay

## 2022-07-14 NOTE — Patient Outreach (Signed)
  Care Coordination   Initial Visit Note   07/14/2022 Name: ALISE CALAIS MRN: [id number] DOB: [birth year]  VALERIA BOZA is a 89 y.o. year old female who sees Paulina Fusi, MD for primary care. I spoke with  Gwynneth Albright by phone today.  What matters to the patients health and wellness today?  Placed call to patient and reviewed and offer [facility] care coordination program.  Patient hung up on me.      SDOH assessments and interventions completed:  No     Care Coordination Interventions:  No, not indicated   Follow up plan: No further intervention required.   Encounter Outcome:  Pt. Refused   Rowe Pavy, RN, BSN, CEN [facility] [employer] [phone]

## 2022-07-24 ENCOUNTER — Ambulatory Visit (INDEPENDENT_AMBULATORY_CARE_PROVIDER_SITE_OTHER): Payer: Medicare Other

## 2022-07-24 DIAGNOSIS — I480 Paroxysmal atrial fibrillation: Secondary | ICD-10-CM | POA: Diagnosis not present

## 2022-07-25 LAB — CUP PACEART REMOTE DEVICE CHECK
Battery Impedance: 4814 Ohm
Battery Remaining Longevity: 16 mo
Battery Voltage: 2.69 V
Brady Statistic RV Percent Paced: 3 %
Date Time Interrogation Session: 20240506165024
Implantable Lead Connection Status: 753985
Implantable Lead Connection Status: 753985
Implantable Lead Implant Date: 20120911
Implantable Lead Implant Date: 20120911
Implantable Lead Location: 753859
Implantable Lead Location: 753860
Implantable Lead Model: 4092
Implantable Lead Model: 5076
Implantable Pulse Generator Implant Date: 20120911
Lead Channel Impedance Value: 630 Ohm
Lead Channel Impedance Value: 67 Ohm
Lead Channel Pacing Threshold Amplitude: 0.5 V
Lead Channel Pacing Threshold Pulse Width: 0.4 ms
Lead Channel Setting Pacing Amplitude: 2 V
Lead Channel Setting Pacing Pulse Width: 0.4 ms
Lead Channel Setting Sensing Sensitivity: 5.6 mV
Zone Setting Status: 755011
Zone Setting Status: 755011

## 2022-08-13 ENCOUNTER — Emergency Department (HOSPITAL_COMMUNITY)
Admission: EM | Admit: 2022-08-13 | Discharge: 2022-08-13 | Disposition: A | Discharge status: Home / Self Care | Payer: Medicare Other | Attending: Emergency Medicine | Admitting: Emergency Medicine

## 2022-08-13 ENCOUNTER — Emergency Department (HOSPITAL_COMMUNITY): Payer: Medicare Other

## 2022-08-13 ENCOUNTER — Other Ambulatory Visit: Payer: Self-pay

## 2022-08-13 DIAGNOSIS — G8929 Other chronic pain: Secondary | ICD-10-CM | POA: Diagnosis not present

## 2022-08-13 DIAGNOSIS — M549 Dorsalgia, unspecified: Secondary | ICD-10-CM | POA: Diagnosis not present

## 2022-08-13 DIAGNOSIS — Z79899 Other long term (current) drug therapy: Secondary | ICD-10-CM | POA: Diagnosis not present

## 2022-08-13 DIAGNOSIS — M545 Low back pain, unspecified: Secondary | ICD-10-CM | POA: Insufficient documentation

## 2022-08-13 DIAGNOSIS — M5459 Other low back pain: Secondary | ICD-10-CM | POA: Diagnosis not present

## 2022-08-13 DIAGNOSIS — I10 Essential (primary) hypertension: Secondary | ICD-10-CM | POA: Diagnosis not present

## 2022-08-13 MED ORDER — ACETAMINOPHEN 325 MG PO TABS
650.0000 mg | ORAL_TABLET | Freq: Once | ORAL | Status: AC
  Administered 2022-08-13: 650 mg via ORAL
  Filled 2022-08-13: qty 2

## 2022-08-13 NOTE — Discharge Instructions (Addendum)
It was a pleasure taking care of you today!   Your x-ray was negative for fracture or dislocation at this time. It is important that you maintain follow up with your orthopedist as scheduled. You may continue with taking your medications as prescribed to you. Call your primary care provider regarding todays ED visit to set up a follow up appointment. Return to the Emergency Department if you are experiencing loss of bowel or bladder, increasing/worsening symptoms, fever, inability to walk.

## 2022-08-13 NOTE — ED Provider Notes (Signed)
[place] EMERGENCY DEPARTMENT AT [facility] Provider Note   CSN: [id number] Arrival date & time: 08/13/22  1832     History  Chief Complaint  Patient presents with   Back Pain    [name] is a 89 y.o. female with a past medical history of A-fib, chronic back pain, hypertension, who presents emergency department brought in by EMS with concerns for lower back pain.  Notes that her back pain radiates down to the right lower extremity.  She has a follow-up appointment with her orthopedist on 08/17/2022.  Her orthopedist name is Dr. Wynetta Emery.  Notes that this pain has been persistent since March 2024.  She notes that she has a history of chronic urinary incontinence that she is currently taking medications for.  She takes Tylenol at home with mild relief of her symptoms.  Denies numbness, tingling, bowel incontinence.  The history is provided by the patient. No language interpreter was used.       Home Medications Prior to Admission medications   Medication Sig Start Date End Date Taking? Authorizing Provider  acetaminophen (TYLENOL) 325 MG tablet Take 650 mg by mouth every 6 (six) hours as needed (for pain).    [provider]  CALCIUM PO Take 1 tablet by mouth in the morning.    [provider]  cholecalciferol (VITAMIN D) 25 MCG (1000 UNIT) tablet Take 1,000 Units by mouth in the morning.    [provider]  denosumab (PROLIA) 60 MG/ML SOSY injection Inject 60 mg into the skin every 6 (six) months.    [provider]  diltiazem (CARDIZEM CD) 240 MG 24 hr capsule TAKE 1 CAPSULE BY MOUTH EVERY DAY Patient taking differently: Take 240 mg by mouth daily. 12/08/21   Georgeanna Lea, MD  famotidine (PEPCID) 40 MG tablet Take 40 mg by mouth in the morning. 01/16/17   [provider]  hydrOXYzine (ATARAX) 10 MG tablet Take 10 mg by mouth 3 (three) times daily as needed for anxiety. 03/19/22   [provider]  ibuprofen  (ADVIL) 800 MG tablet Take 800 mg by mouth 3 (three) times daily as needed for mild pain or moderate pain. 11/27/21   [provider]  Iodine, Kelp, (KELP PO) Take 1 tablet by mouth in the morning. Sea Kelp (OTC)    [provider]  metolazone (ZAROXOLYN) 2.5 MG tablet Take 1 tablet (2.5 mg total) by mouth 2 (two) times a week. 12/22/21   Georgeanna Lea, MD  Multiple Vitamin (MULTIVITAMIN) tablet Take 1 tablet by mouth daily.    [provider]  oxyCODONE-acetaminophen (PERCOCET) 10-325 MG tablet Take 1 tablet by mouth every 4 (four) hours as needed for pain. 09/02/21   Joen Laura, MD  pantoprazole (PROTONIX) 40 MG tablet Take 40 mg by mouth every evening. 01/16/17   [provider]  Potassium 99 MG TABS Take 99 mg by mouth in the morning.    [provider]  torsemide (DEMADEX) 100 MG tablet Take 0.5 tablets (50 mg total) by mouth 2 (two) times daily. 10/03/21   Georgeanna Lea, MD  triamcinolone (KENALOG) 0.025 % cream Apply 1 Application topically 2 (two) times daily. 09/30/21   [provider]  valsartan (DIOVAN) 160 MG tablet Take 160 mg by mouth in the morning. 06/29/21   [provider]  vitamin B-12 (CYANOCOBALAMIN) 1000 MCG tablet Take 1,000 mcg by mouth in the morning.    [provider]  VITAMIN  E PO Take 1 capsule by mouth in the morning.    [provider]      Allergies    Levaquin [levofloxacin], Norvasc [amlodipine], and Penicillins    Review of Systems   Review of Systems  Musculoskeletal:  Positive for back pain.  All other systems reviewed and are negative.   Physical Exam Updated Vital Signs BP (!) 197/188   Pulse (!) 102   Temp 98.7 F (37.1 C)   Resp 18   SpO2 100%  Physical Exam Vitals and nursing note reviewed.  Constitutional:      General: She is not in acute distress.    Appearance: Normal appearance.  Eyes:     General: No scleral icterus.    Extraocular  Movements: Extraocular movements intact.  Cardiovascular:     Rate and Rhythm: Normal rate.  Pulmonary:     Effort: Pulmonary effort is normal. No respiratory distress.  Abdominal:     Palpations: Abdomen is soft. There is no mass.     Tenderness: There is no abdominal tenderness.  Musculoskeletal:        General: Normal range of motion.     Cervical back: Neck supple.     Comments: No spinal tenderness to palpation.  No tenderness to palpation noted to musculature of back.  Lidoderm patch noted in the left upper trapezius region.  No overlying skin changes.  Strength sensation intact to bilateral lower extremities.  No tenderness to palpation noted to right lower extremity.  Patient able to flex and extend right knee against resistance without difficulty.  Pedal pulse intact.  Skin:    General: Skin is warm and dry.     Findings: No rash.  Neurological:     Mental Status: She is alert.     Sensory: Sensation is intact.     Motor: Motor function is intact.  Psychiatric:        Behavior: Behavior normal.     ED Results / Procedures / Treatments   Labs (all labs ordered are listed, but only abnormal results are displayed) Labs Reviewed - No data to display  EKG None  Radiology DG Lumbar Spine Complete  Result Date: 08/13/2022 CLINICAL DATA:  Low back pain. EXAM: LUMBAR SPINE - COMPLETE 4+ VIEW COMPARISON:  Jul 27, 2015 FINDINGS: Spinal fusion involving thoracic and lumbosacral spine. The fusion hardware is intact. No acute fracture is seen. Stable vertebroplasty. Multilevel osteoarthritic changes. IMPRESSION: 1. No acute fracture or dislocation identified about the lumbar spine. 2. Spinal fusion involving thoracic and lumbosacral spine. Electronically Signed   By: Ted Mcalpine M.D.   On: 08/13/2022 21:33    Procedures Procedures    Medications Ordered in ED Medications  acetaminophen (TYLENOL) tablet 650 mg (650 mg Oral Given 08/13/22 2250)    ED Course/ Medical  Decision Making/ A&P Clinical Course as of 08/13/22 2319  Sun Aug 13, 2022  2220 Patient reevaluated and asleep on stretcher.  Discussed with patient negative findings on x-ray.  Discussed with patient importance to maintain her follow-up appointment with her back specialist as scheduled for this week. [SB]  2318 Patient ambulatory in the emergency department prior to discharge.  Patient notes she ambulates with a walker at baseline. [SB]    Clinical Course User Index [SB] Gemma Ruan A, PA-C                             Medical Decision Making Amount  and/or Complexity of Data Reviewed Radiology: ordered.  Risk OTC drugs.   Patient with lumbar back pain with radiation into the right lower extremity.  Patient notes history of chronic back pain has been going on for more than 3 months.  Has a follow-up appointment with her spine specialist in the next couple of days.  Patient ambulates with a walker at baseline.  Denies loss of bowel/bladder control.  No recent procedures or back.  Denies urinary symptoms.  Patient afebrile.  On exam patient without spinal tenderness to palpation.  No tenderness to palpation noted to musculature of back.  Lidoderm patch in place to left upper back.  Strength sensation intact to bilateral lower extremities. Differential diagnosis includes but is not limited to fracture, herniation, muscle strain, cauda equina, dislocation.   Imaging: I ordered imaging studies including lumbar xray I independently visualized and interpreted imaging which showed:  1. No acute fracture or dislocation identified about the lumbar  spine.  2. Spinal fusion involving thoracic and lumbosacral spine.   I agree with the radiologist interpretation  Medications:  I ordered medication including Tylenol for pain management Reevaluation of the patient after these medicines and interventions, I reevaluated the patient and found that they have improved I have reviewed the patients home  medicines and have made adjustments as needed   Disposition: Presenting suspicious for exacerbation of chronic low back pain has been ongoing for several months.  No acute findings on fracture or dislocation noted on exam today.  Likely contributory to muscle strain.  Patient notes symptoms improved with Tylenol at home. After consideration of the diagnostic results and the patients response to treatment, I feel that the patient would benefit from Discharge home.  Discussed with patient importance of following up with her spine specialist as scheduled.  Discussed importance of follow-up with primary care provider for management.  Supportive care measures and strict return precautions discussed with patient at bedside. Pt acknowledges and verbalizes understanding. Pt appears safe for discharge. Follow up as indicated in discharge paperwork.    This chart was dictated using voice recognition software, Dragon. Despite the best efforts of this provider to proofread and correct errors, errors may still occur which can change documentation meaning.   Final Clinical Impression(s) / ED Diagnoses Final diagnoses:  Chronic low back pain without sciatica, unspecified back pain laterality    Rx / DC Orders ED Discharge Orders     None         Taevion Sikora A, PA-C 08/13/22 2323    Eber Hong, MD 08/15/22 1654

## 2022-08-13 NOTE — ED Triage Notes (Signed)
Patient BIB EMS from home with c/o back pain. Patient had injection in back x2 years ago and caused nerve damage. Patient radiates down right leg. VSS.

## 2022-08-17 DIAGNOSIS — M5440 Lumbago with sciatica, unspecified side: Secondary | ICD-10-CM | POA: Diagnosis not present

## 2022-08-17 DIAGNOSIS — M25551 Pain in right hip: Secondary | ICD-10-CM | POA: Diagnosis not present

## 2022-08-17 DIAGNOSIS — M4120 Other idiopathic scoliosis, site unspecified: Secondary | ICD-10-CM | POA: Diagnosis not present

## 2022-08-17 DIAGNOSIS — H9190 Unspecified hearing loss, unspecified ear: Secondary | ICD-10-CM | POA: Diagnosis not present

## 2022-08-18 ENCOUNTER — Telehealth: Payer: Self-pay

## 2022-08-18 NOTE — Telephone Encounter (Signed)
Transition Care Management Unsuccessful Follow-up Telephone Call  Date of discharge and from where:  08/13/2022 [facility]  Attempts:  1st Attempt  Reason for unsuccessful TCM follow-up call:  No answer/busy  Tevis Conger [facility]  [facility] Population Health Community Resource Care Guide   [email or web address]  ?? [phone]   Website: [email or web address]  [email or web address]

## 2022-08-21 ENCOUNTER — Telehealth: Payer: Self-pay

## 2022-08-21 DIAGNOSIS — S32110A Nondisplaced Zone I fracture of sacrum, initial encounter for closed fracture: Secondary | ICD-10-CM | POA: Diagnosis not present

## 2022-08-21 DIAGNOSIS — I700 Atherosclerosis of aorta: Secondary | ICD-10-CM | POA: Diagnosis not present

## 2022-08-21 DIAGNOSIS — M160 Bilateral primary osteoarthritis of hip: Secondary | ICD-10-CM | POA: Diagnosis not present

## 2022-08-21 DIAGNOSIS — M4804 Spinal stenosis, thoracic region: Secondary | ICD-10-CM | POA: Diagnosis not present

## 2022-08-21 DIAGNOSIS — M47814 Spondylosis without myelopathy or radiculopathy, thoracic region: Secondary | ICD-10-CM | POA: Diagnosis not present

## 2022-08-21 DIAGNOSIS — M549 Dorsalgia, unspecified: Secondary | ICD-10-CM | POA: Diagnosis not present

## 2022-08-21 DIAGNOSIS — K5730 Diverticulosis of large intestine without perforation or abscess without bleeding: Secondary | ICD-10-CM | POA: Diagnosis not present

## 2022-08-21 DIAGNOSIS — S3210XA Unspecified fracture of sacrum, initial encounter for closed fracture: Secondary | ICD-10-CM | POA: Diagnosis not present

## 2022-08-21 NOTE — Telephone Encounter (Signed)
Transition Care Management Unsuccessful Follow-up Telephone Call  Date of discharge and from where:  08/13/2022 [facility]  Attempts:  2nd Attempt  Reason for unsuccessful TCM follow-up call:  No answer/busy  Danyel Tobey [facility]  [facility] Population Health Community Resource Care Guide   [email or web address]  ?? [phone]   Website: [email or web address]  [email or web address]

## 2022-08-23 NOTE — Progress Notes (Signed)
Remote pacemaker transmission.   

## 2022-09-05 DIAGNOSIS — M25551 Pain in right hip: Secondary | ICD-10-CM | POA: Diagnosis not present

## 2022-09-05 DIAGNOSIS — M533 Sacrococcygeal disorders, not elsewhere classified: Secondary | ICD-10-CM | POA: Diagnosis not present

## 2022-09-20 DIAGNOSIS — M533 Sacrococcygeal disorders, not elsewhere classified: Secondary | ICD-10-CM | POA: Diagnosis not present

## 2022-10-03 ENCOUNTER — Ambulatory Visit: Payer: Medicare Other

## 2022-10-04 DIAGNOSIS — M79605 Pain in left leg: Secondary | ICD-10-CM | POA: Diagnosis not present

## 2022-10-04 DIAGNOSIS — I2119 ST elevation (STEMI) myocardial infarction involving other coronary artery of inferior wall: Secondary | ICD-10-CM | POA: Diagnosis not present

## 2022-10-04 DIAGNOSIS — N179 Acute kidney failure, unspecified: Secondary | ICD-10-CM | POA: Diagnosis not present

## 2022-10-04 DIAGNOSIS — K219 Gastro-esophageal reflux disease without esophagitis: Secondary | ICD-10-CM | POA: Diagnosis not present

## 2022-10-04 DIAGNOSIS — R9431 Abnormal electrocardiogram [ECG] [EKG]: Secondary | ICD-10-CM | POA: Diagnosis not present

## 2022-10-04 DIAGNOSIS — R296 Repeated falls: Secondary | ICD-10-CM | POA: Diagnosis not present

## 2022-10-04 DIAGNOSIS — Z6823 Body mass index (BMI) 23.0-23.9, adult: Secondary | ICD-10-CM | POA: Diagnosis not present

## 2022-10-04 DIAGNOSIS — N390 Urinary tract infection, site not specified: Secondary | ICD-10-CM | POA: Diagnosis not present

## 2022-10-04 DIAGNOSIS — I499 Cardiac arrhythmia, unspecified: Secondary | ICD-10-CM | POA: Diagnosis not present

## 2022-10-04 DIAGNOSIS — G8929 Other chronic pain: Secondary | ICD-10-CM | POA: Diagnosis not present

## 2022-10-04 DIAGNOSIS — Z888 Allergy status to other drugs, medicaments and biological substances status: Secondary | ICD-10-CM | POA: Diagnosis not present

## 2022-10-04 DIAGNOSIS — N189 Chronic kidney disease, unspecified: Secondary | ICD-10-CM | POA: Diagnosis not present

## 2022-10-04 DIAGNOSIS — Z8711 Personal history of peptic ulcer disease: Secondary | ICD-10-CM | POA: Diagnosis not present

## 2022-10-04 DIAGNOSIS — Z743 Need for continuous supervision: Secondary | ICD-10-CM | POA: Diagnosis not present

## 2022-10-04 DIAGNOSIS — D649 Anemia, unspecified: Secondary | ICD-10-CM | POA: Diagnosis not present

## 2022-10-04 DIAGNOSIS — I5042 Chronic combined systolic (congestive) and diastolic (congestive) heart failure: Secondary | ICD-10-CM | POA: Diagnosis not present

## 2022-10-04 DIAGNOSIS — E43 Unspecified severe protein-calorie malnutrition: Secondary | ICD-10-CM | POA: Diagnosis not present

## 2022-10-04 DIAGNOSIS — Z7982 Long term (current) use of aspirin: Secondary | ICD-10-CM | POA: Diagnosis not present

## 2022-10-04 DIAGNOSIS — R778 Other specified abnormalities of plasma proteins: Secondary | ICD-10-CM | POA: Diagnosis not present

## 2022-10-04 DIAGNOSIS — I959 Hypotension, unspecified: Secondary | ICD-10-CM | POA: Diagnosis not present

## 2022-10-04 DIAGNOSIS — M25519 Pain in unspecified shoulder: Secondary | ICD-10-CM | POA: Diagnosis not present

## 2022-10-04 DIAGNOSIS — I25118 Atherosclerotic heart disease of native coronary artery with other forms of angina pectoris: Secondary | ICD-10-CM | POA: Diagnosis not present

## 2022-10-04 DIAGNOSIS — Z792 Long term (current) use of antibiotics: Secondary | ICD-10-CM | POA: Diagnosis not present

## 2022-10-04 DIAGNOSIS — Z881 Allergy status to other antibiotic agents status: Secondary | ICD-10-CM | POA: Diagnosis not present

## 2022-10-04 DIAGNOSIS — M5021 Other cervical disc displacement,  high cervical region: Secondary | ICD-10-CM | POA: Diagnosis not present

## 2022-10-04 DIAGNOSIS — Z79891 Long term (current) use of opiate analgesic: Secondary | ICD-10-CM | POA: Diagnosis not present

## 2022-10-04 DIAGNOSIS — Z950 Presence of cardiac pacemaker: Secondary | ICD-10-CM | POA: Diagnosis not present

## 2022-10-04 DIAGNOSIS — I10 Essential (primary) hypertension: Secondary | ICD-10-CM | POA: Diagnosis not present

## 2022-10-04 DIAGNOSIS — M47812 Spondylosis without myelopathy or radiculopathy, cervical region: Secondary | ICD-10-CM | POA: Diagnosis not present

## 2022-10-04 DIAGNOSIS — M4802 Spinal stenosis, cervical region: Secondary | ICD-10-CM | POA: Diagnosis not present

## 2022-10-04 DIAGNOSIS — Z791 Long term (current) use of non-steroidal anti-inflammatories (NSAID): Secondary | ICD-10-CM | POA: Diagnosis not present

## 2022-10-04 DIAGNOSIS — I130 Hypertensive heart and chronic kidney disease with heart failure and stage 1 through stage 4 chronic kidney disease, or unspecified chronic kidney disease: Secondary | ICD-10-CM | POA: Diagnosis not present

## 2022-10-04 DIAGNOSIS — W19XXXA Unspecified fall, initial encounter: Secondary | ICD-10-CM | POA: Diagnosis not present

## 2022-10-04 DIAGNOSIS — R69 Illness, unspecified: Secondary | ICD-10-CM | POA: Diagnosis not present

## 2022-10-04 DIAGNOSIS — Z880 Allergy status to penicillin: Secondary | ICD-10-CM | POA: Diagnosis not present

## 2022-10-04 DIAGNOSIS — M549 Dorsalgia, unspecified: Secondary | ICD-10-CM | POA: Diagnosis not present

## 2022-10-04 DIAGNOSIS — Z8701 Personal history of pneumonia (recurrent): Secondary | ICD-10-CM | POA: Diagnosis not present

## 2022-10-04 DIAGNOSIS — G238 Other specified degenerative diseases of basal ganglia: Secondary | ICD-10-CM | POA: Diagnosis not present

## 2022-10-04 DIAGNOSIS — M1990 Unspecified osteoarthritis, unspecified site: Secondary | ICD-10-CM | POA: Diagnosis not present

## 2022-10-04 DIAGNOSIS — R531 Weakness: Secondary | ICD-10-CM | POA: Diagnosis not present

## 2022-10-04 DIAGNOSIS — I4891 Unspecified atrial fibrillation: Secondary | ICD-10-CM | POA: Diagnosis not present

## 2022-10-11 DIAGNOSIS — M1990 Unspecified osteoarthritis, unspecified site: Secondary | ICD-10-CM | POA: Diagnosis not present

## 2022-10-11 DIAGNOSIS — R296 Repeated falls: Secondary | ICD-10-CM | POA: Diagnosis not present

## 2022-10-11 DIAGNOSIS — I959 Hypotension, unspecified: Secondary | ICD-10-CM | POA: Diagnosis not present

## 2022-10-11 DIAGNOSIS — R21 Rash and other nonspecific skin eruption: Secondary | ICD-10-CM | POA: Diagnosis not present

## 2022-10-11 DIAGNOSIS — I509 Heart failure, unspecified: Secondary | ICD-10-CM | POA: Diagnosis not present

## 2022-10-11 DIAGNOSIS — I499 Cardiac arrhythmia, unspecified: Secondary | ICD-10-CM | POA: Diagnosis not present

## 2022-10-11 DIAGNOSIS — M25519 Pain in unspecified shoulder: Secondary | ICD-10-CM | POA: Diagnosis not present

## 2022-10-11 DIAGNOSIS — E538 Deficiency of other specified B group vitamins: Secondary | ICD-10-CM | POA: Diagnosis not present

## 2022-10-11 DIAGNOSIS — E559 Vitamin D deficiency, unspecified: Secondary | ICD-10-CM | POA: Diagnosis not present

## 2022-10-11 DIAGNOSIS — I25118 Atherosclerotic heart disease of native coronary artery with other forms of angina pectoris: Secondary | ICD-10-CM | POA: Diagnosis not present

## 2022-10-11 DIAGNOSIS — I10 Essential (primary) hypertension: Secondary | ICD-10-CM | POA: Diagnosis not present

## 2022-10-11 DIAGNOSIS — M549 Dorsalgia, unspecified: Secondary | ICD-10-CM | POA: Diagnosis not present

## 2022-10-11 DIAGNOSIS — I4891 Unspecified atrial fibrillation: Secondary | ICD-10-CM | POA: Diagnosis not present

## 2022-10-11 DIAGNOSIS — I5030 Unspecified diastolic (congestive) heart failure: Secondary | ICD-10-CM | POA: Diagnosis not present

## 2022-10-11 DIAGNOSIS — E43 Unspecified severe protein-calorie malnutrition: Secondary | ICD-10-CM | POA: Diagnosis not present

## 2022-10-11 DIAGNOSIS — Z743 Need for continuous supervision: Secondary | ICD-10-CM | POA: Diagnosis not present

## 2022-10-11 DIAGNOSIS — N183 Chronic kidney disease, stage 3 unspecified: Secondary | ICD-10-CM | POA: Diagnosis not present

## 2022-10-11 DIAGNOSIS — K279 Peptic ulcer, site unspecified, unspecified as acute or chronic, without hemorrhage or perforation: Secondary | ICD-10-CM | POA: Diagnosis not present

## 2022-10-11 DIAGNOSIS — D649 Anemia, unspecified: Secondary | ICD-10-CM | POA: Diagnosis not present

## 2022-10-11 DIAGNOSIS — G8929 Other chronic pain: Secondary | ICD-10-CM | POA: Diagnosis not present

## 2022-10-11 DIAGNOSIS — I130 Hypertensive heart and chronic kidney disease with heart failure and stage 1 through stage 4 chronic kidney disease, or unspecified chronic kidney disease: Secondary | ICD-10-CM | POA: Diagnosis not present

## 2022-10-16 DIAGNOSIS — I5030 Unspecified diastolic (congestive) heart failure: Secondary | ICD-10-CM | POA: Diagnosis not present

## 2022-10-16 DIAGNOSIS — R296 Repeated falls: Secondary | ICD-10-CM | POA: Diagnosis not present

## 2022-10-16 DIAGNOSIS — I4891 Unspecified atrial fibrillation: Secondary | ICD-10-CM | POA: Diagnosis not present

## 2022-10-16 DIAGNOSIS — N183 Chronic kidney disease, stage 3 unspecified: Secondary | ICD-10-CM | POA: Diagnosis not present

## 2022-10-18 DIAGNOSIS — M1990 Unspecified osteoarthritis, unspecified site: Secondary | ICD-10-CM | POA: Diagnosis not present

## 2022-10-18 DIAGNOSIS — M549 Dorsalgia, unspecified: Secondary | ICD-10-CM | POA: Diagnosis not present

## 2022-10-18 DIAGNOSIS — G8929 Other chronic pain: Secondary | ICD-10-CM | POA: Diagnosis not present

## 2022-10-18 DIAGNOSIS — I509 Heart failure, unspecified: Secondary | ICD-10-CM | POA: Diagnosis not present

## 2022-10-18 DIAGNOSIS — I10 Essential (primary) hypertension: Secondary | ICD-10-CM | POA: Diagnosis not present

## 2022-10-18 DIAGNOSIS — I4891 Unspecified atrial fibrillation: Secondary | ICD-10-CM | POA: Diagnosis not present

## 2022-10-18 DIAGNOSIS — D649 Anemia, unspecified: Secondary | ICD-10-CM | POA: Diagnosis not present

## 2022-10-18 DIAGNOSIS — R296 Repeated falls: Secondary | ICD-10-CM | POA: Diagnosis not present

## 2022-10-18 DIAGNOSIS — E559 Vitamin D deficiency, unspecified: Secondary | ICD-10-CM | POA: Diagnosis not present

## 2022-10-23 ENCOUNTER — Ambulatory Visit: Payer: Medicare Other

## 2022-10-25 DIAGNOSIS — R296 Repeated falls: Secondary | ICD-10-CM | POA: Diagnosis not present

## 2022-10-25 DIAGNOSIS — R21 Rash and other nonspecific skin eruption: Secondary | ICD-10-CM | POA: Diagnosis not present

## 2022-10-25 DIAGNOSIS — I4891 Unspecified atrial fibrillation: Secondary | ICD-10-CM | POA: Diagnosis not present

## 2022-10-25 DIAGNOSIS — M1990 Unspecified osteoarthritis, unspecified site: Secondary | ICD-10-CM | POA: Diagnosis not present

## 2022-10-25 DIAGNOSIS — M549 Dorsalgia, unspecified: Secondary | ICD-10-CM | POA: Diagnosis not present

## 2022-10-25 DIAGNOSIS — E559 Vitamin D deficiency, unspecified: Secondary | ICD-10-CM | POA: Diagnosis not present

## 2022-10-25 DIAGNOSIS — E538 Deficiency of other specified B group vitamins: Secondary | ICD-10-CM | POA: Diagnosis not present

## 2022-10-25 DIAGNOSIS — K279 Peptic ulcer, site unspecified, unspecified as acute or chronic, without hemorrhage or perforation: Secondary | ICD-10-CM | POA: Diagnosis not present

## 2022-10-25 DIAGNOSIS — D649 Anemia, unspecified: Secondary | ICD-10-CM | POA: Diagnosis not present

## 2022-10-25 DIAGNOSIS — I509 Heart failure, unspecified: Secondary | ICD-10-CM | POA: Diagnosis not present

## 2022-10-25 DIAGNOSIS — G8929 Other chronic pain: Secondary | ICD-10-CM | POA: Diagnosis not present

## 2022-10-25 DIAGNOSIS — I10 Essential (primary) hypertension: Secondary | ICD-10-CM | POA: Diagnosis not present

## 2022-10-31 DIAGNOSIS — M549 Dorsalgia, unspecified: Secondary | ICD-10-CM | POA: Diagnosis not present

## 2022-10-31 DIAGNOSIS — Z96611 Presence of right artificial shoulder joint: Secondary | ICD-10-CM | POA: Diagnosis not present

## 2022-10-31 DIAGNOSIS — E559 Vitamin D deficiency, unspecified: Secondary | ICD-10-CM | POA: Diagnosis not present

## 2022-10-31 DIAGNOSIS — D631 Anemia in chronic kidney disease: Secondary | ICD-10-CM | POA: Diagnosis not present

## 2022-10-31 DIAGNOSIS — I700 Atherosclerosis of aorta: Secondary | ICD-10-CM | POA: Diagnosis not present

## 2022-10-31 DIAGNOSIS — K219 Gastro-esophageal reflux disease without esophagitis: Secondary | ICD-10-CM | POA: Diagnosis not present

## 2022-10-31 DIAGNOSIS — Z950 Presence of cardiac pacemaker: Secondary | ICD-10-CM | POA: Diagnosis not present

## 2022-10-31 DIAGNOSIS — Z79891 Long term (current) use of opiate analgesic: Secondary | ICD-10-CM | POA: Diagnosis not present

## 2022-10-31 DIAGNOSIS — I130 Hypertensive heart and chronic kidney disease with heart failure and stage 1 through stage 4 chronic kidney disease, or unspecified chronic kidney disease: Secondary | ICD-10-CM | POA: Diagnosis not present

## 2022-10-31 DIAGNOSIS — L89892 Pressure ulcer of other site, stage 2: Secondary | ICD-10-CM | POA: Diagnosis not present

## 2022-10-31 DIAGNOSIS — G894 Chronic pain syndrome: Secondary | ICD-10-CM | POA: Diagnosis not present

## 2022-10-31 DIAGNOSIS — N189 Chronic kidney disease, unspecified: Secondary | ICD-10-CM | POA: Diagnosis not present

## 2022-10-31 DIAGNOSIS — I4891 Unspecified atrial fibrillation: Secondary | ICD-10-CM | POA: Diagnosis not present

## 2022-10-31 DIAGNOSIS — L97811 Non-pressure chronic ulcer of other part of right lower leg limited to breakdown of skin: Secondary | ICD-10-CM | POA: Diagnosis not present

## 2022-10-31 DIAGNOSIS — M1990 Unspecified osteoarthritis, unspecified site: Secondary | ICD-10-CM | POA: Diagnosis not present

## 2022-10-31 DIAGNOSIS — K279 Peptic ulcer, site unspecified, unspecified as acute or chronic, without hemorrhage or perforation: Secondary | ICD-10-CM | POA: Diagnosis not present

## 2022-10-31 DIAGNOSIS — F419 Anxiety disorder, unspecified: Secondary | ICD-10-CM | POA: Diagnosis not present

## 2022-10-31 DIAGNOSIS — R296 Repeated falls: Secondary | ICD-10-CM | POA: Diagnosis not present

## 2022-10-31 DIAGNOSIS — E538 Deficiency of other specified B group vitamins: Secondary | ICD-10-CM | POA: Diagnosis not present

## 2022-10-31 DIAGNOSIS — D509 Iron deficiency anemia, unspecified: Secondary | ICD-10-CM | POA: Diagnosis not present

## 2022-10-31 DIAGNOSIS — N390 Urinary tract infection, site not specified: Secondary | ICD-10-CM | POA: Diagnosis not present

## 2022-10-31 DIAGNOSIS — I509 Heart failure, unspecified: Secondary | ICD-10-CM | POA: Diagnosis not present

## 2022-10-31 DIAGNOSIS — I872 Venous insufficiency (chronic) (peripheral): Secondary | ICD-10-CM | POA: Diagnosis not present

## 2022-10-31 DIAGNOSIS — L97821 Non-pressure chronic ulcer of other part of left lower leg limited to breakdown of skin: Secondary | ICD-10-CM | POA: Diagnosis not present

## 2022-10-31 DIAGNOSIS — Z7982 Long term (current) use of aspirin: Secondary | ICD-10-CM | POA: Diagnosis not present

## 2022-11-01 DIAGNOSIS — R1310 Dysphagia, unspecified: Secondary | ICD-10-CM | POA: Diagnosis not present

## 2022-11-01 DIAGNOSIS — L89892 Pressure ulcer of other site, stage 2: Secondary | ICD-10-CM | POA: Diagnosis not present

## 2022-11-01 DIAGNOSIS — S81801A Unspecified open wound, right lower leg, initial encounter: Secondary | ICD-10-CM | POA: Diagnosis not present

## 2022-11-01 DIAGNOSIS — L97811 Non-pressure chronic ulcer of other part of right lower leg limited to breakdown of skin: Secondary | ICD-10-CM | POA: Diagnosis not present

## 2022-11-01 DIAGNOSIS — I872 Venous insufficiency (chronic) (peripheral): Secondary | ICD-10-CM | POA: Diagnosis not present

## 2022-11-01 DIAGNOSIS — L97821 Non-pressure chronic ulcer of other part of left lower leg limited to breakdown of skin: Secondary | ICD-10-CM | POA: Diagnosis not present

## 2022-11-01 DIAGNOSIS — I130 Hypertensive heart and chronic kidney disease with heart failure and stage 1 through stage 4 chronic kidney disease, or unspecified chronic kidney disease: Secondary | ICD-10-CM | POA: Diagnosis not present

## 2022-11-01 DIAGNOSIS — I4891 Unspecified atrial fibrillation: Secondary | ICD-10-CM | POA: Diagnosis not present

## 2022-11-01 DIAGNOSIS — K224 Dyskinesia of esophagus: Secondary | ICD-10-CM | POA: Diagnosis not present

## 2022-11-01 DIAGNOSIS — K2960 Other gastritis without bleeding: Secondary | ICD-10-CM | POA: Diagnosis not present

## 2022-11-01 DIAGNOSIS — S81802A Unspecified open wound, left lower leg, initial encounter: Secondary | ICD-10-CM | POA: Diagnosis not present

## 2022-11-03 DIAGNOSIS — I872 Venous insufficiency (chronic) (peripheral): Secondary | ICD-10-CM | POA: Diagnosis not present

## 2022-11-03 DIAGNOSIS — L97811 Non-pressure chronic ulcer of other part of right lower leg limited to breakdown of skin: Secondary | ICD-10-CM | POA: Diagnosis not present

## 2022-11-03 DIAGNOSIS — L97821 Non-pressure chronic ulcer of other part of left lower leg limited to breakdown of skin: Secondary | ICD-10-CM | POA: Diagnosis not present

## 2022-11-03 DIAGNOSIS — I4891 Unspecified atrial fibrillation: Secondary | ICD-10-CM | POA: Diagnosis not present

## 2022-11-03 DIAGNOSIS — L89892 Pressure ulcer of other site, stage 2: Secondary | ICD-10-CM | POA: Diagnosis not present

## 2022-11-03 DIAGNOSIS — I130 Hypertensive heart and chronic kidney disease with heart failure and stage 1 through stage 4 chronic kidney disease, or unspecified chronic kidney disease: Secondary | ICD-10-CM | POA: Diagnosis not present

## 2022-11-06 DIAGNOSIS — L97811 Non-pressure chronic ulcer of other part of right lower leg limited to breakdown of skin: Secondary | ICD-10-CM | POA: Diagnosis not present

## 2022-11-06 DIAGNOSIS — I4891 Unspecified atrial fibrillation: Secondary | ICD-10-CM | POA: Diagnosis not present

## 2022-11-06 DIAGNOSIS — L89892 Pressure ulcer of other site, stage 2: Secondary | ICD-10-CM | POA: Diagnosis not present

## 2022-11-06 DIAGNOSIS — L97821 Non-pressure chronic ulcer of other part of left lower leg limited to breakdown of skin: Secondary | ICD-10-CM | POA: Diagnosis not present

## 2022-11-06 DIAGNOSIS — I130 Hypertensive heart and chronic kidney disease with heart failure and stage 1 through stage 4 chronic kidney disease, or unspecified chronic kidney disease: Secondary | ICD-10-CM | POA: Diagnosis not present

## 2022-11-06 DIAGNOSIS — I872 Venous insufficiency (chronic) (peripheral): Secondary | ICD-10-CM | POA: Diagnosis not present

## 2022-11-07 DIAGNOSIS — L97811 Non-pressure chronic ulcer of other part of right lower leg limited to breakdown of skin: Secondary | ICD-10-CM | POA: Diagnosis not present

## 2022-11-07 DIAGNOSIS — L89892 Pressure ulcer of other site, stage 2: Secondary | ICD-10-CM | POA: Diagnosis not present

## 2022-11-07 DIAGNOSIS — I130 Hypertensive heart and chronic kidney disease with heart failure and stage 1 through stage 4 chronic kidney disease, or unspecified chronic kidney disease: Secondary | ICD-10-CM | POA: Diagnosis not present

## 2022-11-07 DIAGNOSIS — I4891 Unspecified atrial fibrillation: Secondary | ICD-10-CM | POA: Diagnosis not present

## 2022-11-07 DIAGNOSIS — I872 Venous insufficiency (chronic) (peripheral): Secondary | ICD-10-CM | POA: Diagnosis not present

## 2022-11-07 DIAGNOSIS — L97821 Non-pressure chronic ulcer of other part of left lower leg limited to breakdown of skin: Secondary | ICD-10-CM | POA: Diagnosis not present

## 2022-11-08 DIAGNOSIS — L97811 Non-pressure chronic ulcer of other part of right lower leg limited to breakdown of skin: Secondary | ICD-10-CM | POA: Diagnosis not present

## 2022-11-08 DIAGNOSIS — I130 Hypertensive heart and chronic kidney disease with heart failure and stage 1 through stage 4 chronic kidney disease, or unspecified chronic kidney disease: Secondary | ICD-10-CM | POA: Diagnosis not present

## 2022-11-08 DIAGNOSIS — L97821 Non-pressure chronic ulcer of other part of left lower leg limited to breakdown of skin: Secondary | ICD-10-CM | POA: Diagnosis not present

## 2022-11-08 DIAGNOSIS — I872 Venous insufficiency (chronic) (peripheral): Secondary | ICD-10-CM | POA: Diagnosis not present

## 2022-11-08 DIAGNOSIS — L89892 Pressure ulcer of other site, stage 2: Secondary | ICD-10-CM | POA: Diagnosis not present

## 2022-11-08 DIAGNOSIS — I4891 Unspecified atrial fibrillation: Secondary | ICD-10-CM | POA: Diagnosis not present

## 2022-11-10 DIAGNOSIS — I872 Venous insufficiency (chronic) (peripheral): Secondary | ICD-10-CM | POA: Diagnosis not present

## 2022-11-10 DIAGNOSIS — L97811 Non-pressure chronic ulcer of other part of right lower leg limited to breakdown of skin: Secondary | ICD-10-CM | POA: Diagnosis not present

## 2022-11-10 DIAGNOSIS — I4891 Unspecified atrial fibrillation: Secondary | ICD-10-CM | POA: Diagnosis not present

## 2022-11-10 DIAGNOSIS — I130 Hypertensive heart and chronic kidney disease with heart failure and stage 1 through stage 4 chronic kidney disease, or unspecified chronic kidney disease: Secondary | ICD-10-CM | POA: Diagnosis not present

## 2022-11-10 DIAGNOSIS — L97821 Non-pressure chronic ulcer of other part of left lower leg limited to breakdown of skin: Secondary | ICD-10-CM | POA: Diagnosis not present

## 2022-11-10 DIAGNOSIS — L89892 Pressure ulcer of other site, stage 2: Secondary | ICD-10-CM | POA: Diagnosis not present

## 2022-11-12 DIAGNOSIS — R609 Edema, unspecified: Secondary | ICD-10-CM | POA: Diagnosis not present

## 2022-11-12 DIAGNOSIS — N390 Urinary tract infection, site not specified: Secondary | ICD-10-CM | POA: Diagnosis not present

## 2022-11-12 DIAGNOSIS — R1310 Dysphagia, unspecified: Secondary | ICD-10-CM | POA: Diagnosis not present

## 2022-11-12 DIAGNOSIS — S91302A Unspecified open wound, left foot, initial encounter: Secondary | ICD-10-CM | POA: Diagnosis not present

## 2022-11-12 DIAGNOSIS — Z888 Allergy status to other drugs, medicaments and biological substances status: Secondary | ICD-10-CM | POA: Diagnosis not present

## 2022-11-12 DIAGNOSIS — E860 Dehydration: Secondary | ICD-10-CM | POA: Diagnosis not present

## 2022-11-12 DIAGNOSIS — R1111 Vomiting without nausea: Secondary | ICD-10-CM | POA: Diagnosis not present

## 2022-11-12 DIAGNOSIS — I2699 Other pulmonary embolism without acute cor pulmonale: Secondary | ICD-10-CM | POA: Diagnosis not present

## 2022-11-12 DIAGNOSIS — N261 Atrophy of kidney (terminal): Secondary | ICD-10-CM | POA: Diagnosis not present

## 2022-11-12 DIAGNOSIS — I4891 Unspecified atrial fibrillation: Secondary | ICD-10-CM | POA: Diagnosis not present

## 2022-11-12 DIAGNOSIS — Z950 Presence of cardiac pacemaker: Secondary | ICD-10-CM | POA: Diagnosis not present

## 2022-11-12 DIAGNOSIS — R6521 Severe sepsis with septic shock: Secondary | ICD-10-CM | POA: Diagnosis not present

## 2022-11-12 DIAGNOSIS — E876 Hypokalemia: Secondary | ICD-10-CM | POA: Diagnosis not present

## 2022-11-12 DIAGNOSIS — R918 Other nonspecific abnormal finding of lung field: Secondary | ICD-10-CM | POA: Diagnosis not present

## 2022-11-12 DIAGNOSIS — L89159 Pressure ulcer of sacral region, unspecified stage: Secondary | ICD-10-CM | POA: Diagnosis not present

## 2022-11-12 DIAGNOSIS — G8929 Other chronic pain: Secondary | ICD-10-CM | POA: Diagnosis not present

## 2022-11-12 DIAGNOSIS — I10 Essential (primary) hypertension: Secondary | ICD-10-CM | POA: Diagnosis not present

## 2022-11-12 DIAGNOSIS — A4151 Sepsis due to Escherichia coli [E. coli]: Secondary | ICD-10-CM | POA: Diagnosis not present

## 2022-11-12 DIAGNOSIS — M62272 Nontraumatic ischemic infarction of muscle, left ankle and foot: Secondary | ICD-10-CM | POA: Diagnosis not present

## 2022-11-12 DIAGNOSIS — D649 Anemia, unspecified: Secondary | ICD-10-CM | POA: Diagnosis not present

## 2022-11-12 DIAGNOSIS — K219 Gastro-esophageal reflux disease without esophagitis: Secondary | ICD-10-CM | POA: Diagnosis not present

## 2022-11-12 DIAGNOSIS — Z880 Allergy status to penicillin: Secondary | ICD-10-CM | POA: Diagnosis not present

## 2022-11-12 DIAGNOSIS — R0602 Shortness of breath: Secondary | ICD-10-CM | POA: Diagnosis not present

## 2022-11-12 DIAGNOSIS — N184 Chronic kidney disease, stage 4 (severe): Secondary | ICD-10-CM | POA: Diagnosis not present

## 2022-11-12 DIAGNOSIS — I5032 Chronic diastolic (congestive) heart failure: Secondary | ICD-10-CM | POA: Diagnosis not present

## 2022-11-12 DIAGNOSIS — Z881 Allergy status to other antibiotic agents status: Secondary | ICD-10-CM | POA: Diagnosis not present

## 2022-11-12 DIAGNOSIS — Z0389 Encounter for observation for other suspected diseases and conditions ruled out: Secondary | ICD-10-CM | POA: Diagnosis not present

## 2022-11-12 DIAGNOSIS — A419 Sepsis, unspecified organism: Secondary | ICD-10-CM | POA: Diagnosis not present

## 2022-11-12 DIAGNOSIS — I130 Hypertensive heart and chronic kidney disease with heart failure and stage 1 through stage 4 chronic kidney disease, or unspecified chronic kidney disease: Secondary | ICD-10-CM | POA: Diagnosis not present

## 2022-11-12 DIAGNOSIS — I872 Venous insufficiency (chronic) (peripheral): Secondary | ICD-10-CM | POA: Diagnosis not present

## 2022-11-12 DIAGNOSIS — Z8701 Personal history of pneumonia (recurrent): Secondary | ICD-10-CM | POA: Diagnosis not present

## 2022-11-12 DIAGNOSIS — E8729 Other acidosis: Secondary | ICD-10-CM | POA: Diagnosis not present

## 2022-11-12 DIAGNOSIS — M545 Low back pain, unspecified: Secondary | ICD-10-CM | POA: Diagnosis not present

## 2022-11-12 DIAGNOSIS — R9431 Abnormal electrocardiogram [ECG] [EKG]: Secondary | ICD-10-CM | POA: Diagnosis not present

## 2022-11-12 DIAGNOSIS — R571 Hypovolemic shock: Secondary | ICD-10-CM | POA: Diagnosis not present

## 2022-11-12 DIAGNOSIS — J984 Other disorders of lung: Secondary | ICD-10-CM | POA: Diagnosis not present

## 2022-11-12 DIAGNOSIS — I482 Chronic atrial fibrillation, unspecified: Secondary | ICD-10-CM | POA: Diagnosis not present

## 2022-11-12 DIAGNOSIS — N179 Acute kidney failure, unspecified: Secondary | ICD-10-CM | POA: Diagnosis not present

## 2022-11-12 DIAGNOSIS — I509 Heart failure, unspecified: Secondary | ICD-10-CM | POA: Diagnosis not present

## 2022-11-12 DIAGNOSIS — R112 Nausea with vomiting, unspecified: Secondary | ICD-10-CM | POA: Diagnosis not present

## 2022-11-12 DIAGNOSIS — J90 Pleural effusion, not elsewhere classified: Secondary | ICD-10-CM | POA: Diagnosis not present

## 2022-11-12 DIAGNOSIS — M1990 Unspecified osteoarthritis, unspecified site: Secondary | ICD-10-CM | POA: Diagnosis not present

## 2022-11-12 DIAGNOSIS — R110 Nausea: Secondary | ICD-10-CM | POA: Diagnosis not present

## 2022-11-13 DIAGNOSIS — I10 Essential (primary) hypertension: Secondary | ICD-10-CM | POA: Diagnosis not present

## 2022-11-13 DIAGNOSIS — Z950 Presence of cardiac pacemaker: Secondary | ICD-10-CM | POA: Diagnosis not present

## 2022-11-13 DIAGNOSIS — I4891 Unspecified atrial fibrillation: Secondary | ICD-10-CM | POA: Diagnosis not present

## 2022-11-13 DIAGNOSIS — A419 Sepsis, unspecified organism: Secondary | ICD-10-CM | POA: Diagnosis not present

## 2022-11-14 DIAGNOSIS — N184 Chronic kidney disease, stage 4 (severe): Secondary | ICD-10-CM | POA: Diagnosis not present

## 2022-11-14 DIAGNOSIS — I509 Heart failure, unspecified: Secondary | ICD-10-CM | POA: Diagnosis not present

## 2022-11-14 DIAGNOSIS — I482 Chronic atrial fibrillation, unspecified: Secondary | ICD-10-CM | POA: Diagnosis not present

## 2022-11-15 DIAGNOSIS — I10 Essential (primary) hypertension: Secondary | ICD-10-CM | POA: Diagnosis not present

## 2022-11-15 DIAGNOSIS — Z950 Presence of cardiac pacemaker: Secondary | ICD-10-CM | POA: Diagnosis not present

## 2022-11-15 DIAGNOSIS — A419 Sepsis, unspecified organism: Secondary | ICD-10-CM | POA: Diagnosis not present

## 2022-11-15 DIAGNOSIS — I4891 Unspecified atrial fibrillation: Secondary | ICD-10-CM | POA: Diagnosis not present

## 2022-11-16 DIAGNOSIS — I4891 Unspecified atrial fibrillation: Secondary | ICD-10-CM | POA: Diagnosis not present

## 2022-11-16 DIAGNOSIS — Z950 Presence of cardiac pacemaker: Secondary | ICD-10-CM | POA: Diagnosis not present

## 2022-11-16 DIAGNOSIS — I10 Essential (primary) hypertension: Secondary | ICD-10-CM | POA: Diagnosis not present

## 2022-11-16 DIAGNOSIS — A419 Sepsis, unspecified organism: Secondary | ICD-10-CM | POA: Diagnosis not present

## 2022-11-19 DEATH — deceased

## 2022-12-11 ENCOUNTER — Other Ambulatory Visit: Payer: Self-pay | Admitting: Cardiology

## 2023-01-02 ENCOUNTER — Ambulatory Visit: Payer: Medicare Other

## 2023-01-22 ENCOUNTER — Ambulatory Visit: Payer: Medicare Other

## 2023-04-03 ENCOUNTER — Ambulatory Visit: Payer: Medicare Other

## 2023-04-23 ENCOUNTER — Ambulatory Visit: Payer: Medicare Other

## 2023-07-03 ENCOUNTER — Ambulatory Visit: Payer: Medicare Other

## 2023-07-23 ENCOUNTER — Ambulatory Visit: Payer: Medicare Other

## 2023-10-02 ENCOUNTER — Ambulatory Visit: Payer: Medicare Other
